# Patient Record
Sex: Female | Born: 1935 | Race: White | Hispanic: No | Smoking: Former smoker
Health system: Southern US, Community
[De-identification: ages and names within clinical notes are randomized; demographics above are authoritative.]

## PROBLEM LIST (undated history)

## (undated) DIAGNOSIS — F32A Depression, unspecified: Secondary | ICD-10-CM

## (undated) DIAGNOSIS — F0390 Unspecified dementia without behavioral disturbance: Secondary | ICD-10-CM

## (undated) DIAGNOSIS — J449 Chronic obstructive pulmonary disease, unspecified: Secondary | ICD-10-CM

## (undated) DIAGNOSIS — M1990 Unspecified osteoarthritis, unspecified site: Secondary | ICD-10-CM

## (undated) DIAGNOSIS — T7840XA Allergy, unspecified, initial encounter: Secondary | ICD-10-CM

## (undated) DIAGNOSIS — I219 Acute myocardial infarction, unspecified: Secondary | ICD-10-CM

## (undated) DIAGNOSIS — F329 Major depressive disorder, single episode, unspecified: Secondary | ICD-10-CM

## (undated) DIAGNOSIS — C3492 Malignant neoplasm of unspecified part of left bronchus or lung: Principal | ICD-10-CM

## (undated) DIAGNOSIS — F419 Anxiety disorder, unspecified: Secondary | ICD-10-CM

## (undated) DIAGNOSIS — I2510 Atherosclerotic heart disease of native coronary artery without angina pectoris: Secondary | ICD-10-CM

## (undated) DIAGNOSIS — H269 Unspecified cataract: Secondary | ICD-10-CM

## (undated) HISTORY — PX: TONSILLECTOMY: SUR1361

## (undated) HISTORY — DX: Atherosclerotic heart disease of native coronary artery without angina pectoris: I25.10

## (undated) HISTORY — DX: Acute myocardial infarction, unspecified: I21.9

## (undated) HISTORY — DX: Unspecified osteoarthritis, unspecified site: M19.90

## (undated) HISTORY — PX: OOPHORECTOMY: SHX86

## (undated) HISTORY — DX: Allergy, unspecified, initial encounter: T78.40XA

## (undated) HISTORY — DX: Major depressive disorder, single episode, unspecified: F32.9

## (undated) HISTORY — DX: Unspecified cataract: H26.9

## (undated) HISTORY — DX: Depression, unspecified: F32.A

## (undated) HISTORY — PX: ABDOMINAL HYSTERECTOMY: SHX81

## (undated) HISTORY — DX: Chronic obstructive pulmonary disease, unspecified: J44.9

## (undated) HISTORY — PX: CORONARY ARTERY BYPASS GRAFT: SHX141

## (undated) HISTORY — DX: Malignant neoplasm of unspecified part of left bronchus or lung: C34.92

## (undated) HISTORY — DX: Unspecified dementia, unspecified severity, without behavioral disturbance, psychotic disturbance, mood disturbance, and anxiety: F03.90

## (undated) HISTORY — DX: Anxiety disorder, unspecified: F41.9

---

## 2013-02-16 LAB — PULMONARY FUNCTION TEST

## 2013-07-14 LAB — PULMONARY FUNCTION TEST

## 2016-08-27 ENCOUNTER — Encounter: Payer: Self-pay | Admitting: Internal Medicine

## 2016-08-27 ENCOUNTER — Encounter: Payer: Self-pay | Admitting: *Deleted

## 2016-08-27 ENCOUNTER — Telehealth: Payer: Self-pay | Admitting: Internal Medicine

## 2016-08-27 NOTE — Progress Notes (Signed)
Oncology Nurse Navigator Documentation  Oncology Nurse Navigator Flowsheets 08/27/2016  Navigator Location [facility]  Referral date to RadOnc/MedOnc 08/27/2016  Navigator Encounter Type Other/In looking through Ms. [facility] information from oncology, I need more information.  I contacted [facility] in [facility] and requested more information.   Barriers/Navigation Needs Coordination of Care  Interventions Coordination of Care  Coordination of Care Other  Acuity Level 2  Acuity Level 2 Other  Time Spent with Patient 60

## 2016-08-27 NOTE — Telephone Encounter (Signed)
[name] cld to schedule an appt. Appt has been scheduled for the pt to see Dr. Julien Nordmann on 9/18 at 215pm. [name] will bring in records today. Aware to arrive 30 minutes early. Letter will be given with the appt date and time.

## 2016-08-29 ENCOUNTER — Telehealth: Payer: Self-pay | Admitting: Cardiology

## 2016-08-29 NOTE — Telephone Encounter (Signed)
Received records from [facility] as requested by Dr Stanford Breed.  Records given to Dr Stanford Breed for review. lp

## 2016-08-29 NOTE — Telephone Encounter (Signed)
Received records requested for new patient appointment with Dr Stanford Breed on 10/04/16.  Records put with Dr Jacalyn Lefevre schedule for 10/04/16. lp

## 2016-09-04 ENCOUNTER — Telehealth: Payer: Self-pay | Admitting: Cardiology

## 2016-09-04 NOTE — Telephone Encounter (Signed)
Received records from [place] for appointment on 10/04/16 with Dr Stanford Breed.  Records put with Dr Jacalyn Lefevre schedule for 10/04/16. lp

## 2016-09-21 NOTE — Progress Notes (Signed)
Referring-[facility] Reason for referral- Atrial fibrillation.  HPI: 81 year old female for evaluation of atrial fibrillation at request of [facility]. Previously followed in [place]. Patient also had coronary artery bypass graft in 2001. Nuclear study 2015 showed no evidence of infarct or ischemia and ejection fraction 68%. Echocardiogram March 2018 showed ejection fraction 55-60% and grade 1 diastolic dysfunction. Mild left atrial enlargement. Trace aortic and mitral regurgitation. Based on outside notes she has a history of GI bleed on xarelto. She also was on tikosyn previously. She does not recall the diagnosis of atrial fibrillation. She has dyspnea with some activities. No orthopnea, PND, pedal edema, chest pain or syncope. Uses home O2 at night.  Current Outpatient Prescriptions  Medication Sig Dispense Refill  . acetaminophen (TYLENOL) 650 MG CR tablet Take 650 mg by mouth every 8 (eight) hours as needed for pain.    . clonazePAM (KLONOPIN) 0.5 MG tablet Take 0.5 mg by mouth daily as needed for anxiety.     . Coenzyme Q-10 200 MG CAPS Take by mouth.    . dofetilide (TIKOSYN) 125 MCG capsule Take 125 mcg by mouth 2 (two) times daily.    . furosemide (LASIX) 40 MG tablet Take 40 mg by mouth.    . gabapentin (NEURONTIN) 100 MG capsule Take 100 mg by mouth 3 (three) times daily.    . hydroxychloroquine (PLAQUENIL) 200 MG tablet Take by mouth daily.    . hydrOXYzine (ATARAX/VISTARIL) 25 MG tablet Take 25 mg by mouth 3 (three) times daily as needed.    Marland Kitchen levothyroxine (SYNTHROID, LEVOTHROID) 88 MCG tablet Take 88 mcg by mouth daily before breakfast.    . loperamide (IMODIUM A-D) 2 MG tablet Take 2 mg by mouth 4 (four) times daily as needed for diarrhea or loose stools.    . metoprolol tartrate (LOPRESSOR) 25 MG tablet Take 25 mg by mouth 2 (two) times daily.    . mirtazapine (REMERON) 15 MG tablet Take 15 mg by mouth at bedtime.    . mupirocin ointment  (BACTROBAN) 2 % Place 1 application into the nose 2 (two) times daily.    . pantoprazole (PROTONIX) 40 MG tablet Take 40 mg by mouth daily.    . potassium chloride SA (K-DUR,KLOR-CON) 20 MEQ tablet Take 20 mEq by mouth 2 (two) times daily.    . predniSONE (DELTASONE) 5 MG tablet Take 5 mg by mouth daily with breakfast.    . rivastigmine (EXELON) 4.6 mg/24hr Place 4.6 mg onto the skin daily.    . sertraline (ZOLOFT) 100 MG tablet Take 100 mg by mouth daily.    Marland Kitchen tiotropium (SPIRIVA) 18 MCG inhalation capsule Place 18 mcg into inhaler and inhale daily.    . traMADol (ULTRAM) 50 MG tablet Take by mouth 2 (two) times daily.    Marland Kitchen aspirin EC 81 MG tablet Take 1 tablet (81 mg total) by mouth daily. 90 tablet 3  . atorvastatin (LIPITOR) 40 MG tablet Take 1 tablet (40 mg total) by mouth daily. 90 tablet 3   No current facility-administered medications for this visit.     Allergies  Allergen Reactions  . Sulfa Antibiotics   . Contrast Media [Iodinated Diagnostic Agents] Itching and Rash     Past Medical History:  Diagnosis Date  . Allergy   . Anxiety   . Arthritis   . CAD (coronary artery disease)   . Cataract   . COPD (chronic obstructive pulmonary disease) ([place])   . Depression   .  Myocardial infarction ([place])   . Stage IV squamous cell carcinoma of left lung ([place]) 09/25/2016    Past Surgical History:  Procedure Laterality Date  . ABDOMINAL HYSTERECTOMY    . CORONARY ARTERY BYPASS GRAFT    . OOPHORECTOMY    . TONSILLECTOMY      Social History   Social History  . Marital status: Divorced    Spouse name: N/A  . Number of children: 1  . Years of education: N/A   Occupational History  . Not on file.   Social History Main Topics  . Smoking status: Former Smoker    Packs/day: 1.00    Years: 40.00    Types: Cigarettes  . Smokeless tobacco: Never Used  . Alcohol use No  . Drug use: No  . Sexual activity: Not on file   Other Topics Concern  . Not on file   Social History  Narrative  . No narrative on file    Family History  Problem Relation Age of Onset  . Parkinsonism Father     ROS: no fevers or chills, productive cough, hemoptysis, dysphasia, odynophagia, melena, hematochezia, dysuria, hematuria, rash, seizure activity, orthopnea, PND, pedal edema, claudication. Remaining systems are negative.  Physical Exam:   Blood pressure (!) 118/52, pulse (!) 51, height 5' 3.5" (1.613 m), weight 49.9 kg (110 lb).  General:  Well developed/frail in NAD Skin warm/dry Patient not depressed No peripheral clubbing Back-normal HEENT-normal/normal eyelids Neck supple/normal carotid upstroke bilaterally; no bruits; no JVD; no thyromegaly chest - CTA/ normal expansion; previous sternotomy CV - RRR/normal S1 and S2; no murmurs, rubs or gallops;  PMI nondisplaced Abdomen -NT/ND, no HSM, no mass, + bowel sounds, no bruit 2+ femoral pulses, no bruits Ext-no edema, no chords, varicosities noted Neuro-grossly nonfocal  ECG - Sinus bradycardia, left bundle branch block. personally reviewed  A/P  1Coronary artery disease status post coronary artery bypass graft-patient is not having chest pain and last nuclear study by report shows no ischemia. Plan medical therapy. Add enteric-coated aspirin 81 mg daily. Add statin.   2 history of paroxysmal atrial fibrillation-patient is in sinus rhythm today. In reviewing her outside records she was on tikosyn in the past. She is not taking this medication at present. She apparently had a GI bleed on xarelto in the past and is not on anticoagulation. Also frail with mild dementia per son. I therefore elected not to resume at this point. They do understand the higher risk of CVA off of anticoagulation but I feel risk outweighs benefit.  Continue metoprolol for rate control if atrial fibrillation recurs.  3  hyperlipidemia-add Lipitor 40 mg daily. Check lipids and liver in 4 weeks.   Kirk Ruths, MD

## 2016-09-25 ENCOUNTER — Encounter: Payer: Self-pay | Admitting: Internal Medicine

## 2016-09-25 ENCOUNTER — Ambulatory Visit (HOSPITAL_BASED_OUTPATIENT_CLINIC_OR_DEPARTMENT_OTHER): Payer: Medicare Other | Admitting: Internal Medicine

## 2016-09-25 ENCOUNTER — Telehealth: Payer: Self-pay | Admitting: Internal Medicine

## 2016-09-25 DIAGNOSIS — Z87891 Personal history of nicotine dependence: Secondary | ICD-10-CM

## 2016-09-25 DIAGNOSIS — C778 Secondary and unspecified malignant neoplasm of lymph nodes of multiple regions: Secondary | ICD-10-CM

## 2016-09-25 DIAGNOSIS — J449 Chronic obstructive pulmonary disease, unspecified: Secondary | ICD-10-CM | POA: Diagnosis not present

## 2016-09-25 DIAGNOSIS — C3490 Malignant neoplasm of unspecified part of unspecified bronchus or lung: Secondary | ICD-10-CM

## 2016-09-25 DIAGNOSIS — C3492 Malignant neoplasm of unspecified part of left bronchus or lung: Secondary | ICD-10-CM | POA: Diagnosis not present

## 2016-09-25 DIAGNOSIS — M160 Bilateral primary osteoarthritis of hip: Secondary | ICD-10-CM

## 2016-09-25 DIAGNOSIS — F0390 Unspecified dementia without behavioral disturbance: Secondary | ICD-10-CM

## 2016-09-25 HISTORY — DX: Malignant neoplasm of unspecified part of left bronchus or lung: C34.92

## 2016-09-25 NOTE — Telephone Encounter (Signed)
Scheduled appt per 9/18 los - Gave patient AVS and calender per los. - [facility] radiology to contact patient with ct schedule.

## 2016-09-25 NOTE — Progress Notes (Signed)
[place] [phone]   [phone]  CONSULT NOTE  REFERRING PHYSICIAN: Dr. Simonne Martinet, [facility], [place]  REASON FOR CONSULTATION:  82 years old white female with history of lung cancer to establish care with medical oncology.  HPI Kristin Peterson is a 82 y.o. female was past medical history significant for multiple medical problems including history of COPD, anxiety/depression, coronary R disease status post CABG, dyslipidemia, COPD as well as history of smoking for around 35 years but quit more than 20 years ago. The patient also has a recent history of dementia. She came today to the clinic to establish care with me and accompanied by her son. The patient was initially diagnosed with stage IV non-small cell lung cancer, poorly differentiated squamous cell carcinoma in October 2015 when she was complaining of shortness of breath and routine chest x-ray showed multiple pulmonary nodules. Further investigation including a PET scan on 11/03/2013 showed multiple hypermetabolic pulmonary nodules in both the left upper lobe and right upper lobe. There was also a nodule noted in the left lower lobe as well as left hilar and AP window. There was no evidence of distant metastatic disease below the diaphragm. The patient underwent CT guided fine needle aspiration of the left lung nodule on 11/06/2013 and the final pathology from [facility] (case # [id number]) was positive for malignant cells, non-small cell carcinoma consistent with poorly differentiated squamous cell carcinoma. According to the patient she was started initially on 1 or 2 cycles of systemic chemotherapy but she could not tolerate it. She was then started on Nivolumab every 2 weeks and received a total of 11 treatment discontinued in May 2016 secondary to febrile episode as well as anemia secondary to gastritis. Imaging studies [place] the patient on Nivolumab showed improvement of her  condition. She had repeat CT scan of the chest in March 2017 that showed new pulmonary nodules in the lower lobes with some of which have 3 and body appearance and was too small for biopsy or PET.  Repeat CT scan of the chest on 07/21/2015 showed no evidence of disease recurrence or progression with a stable diffuse emphysematous changes and scattered areas of lung scarring. There was also extensive sigmoid colon and diverticular disease noted without diverticulitis. Because of her dementia the patient moved to [facility] to be close to her son. She currently lives in an assisted living facility, [facility]. Her last imaging studies were performed 6 months ago and she was supposed to have repeat scan on 09/18/2016. She came today for evaluation and management of her condition. When seen today the patient is feeling fine except for arthritis of her hip joints. She denied having any chest pain, shortness breath, cough or hemoptysis. She denied having any recent weight loss or night sweats. She has no nausea, vomiting, diarrhea or constipation. She definitely has dementia. Family history father died from parkinsonism, mother died from old age. The patient is divorced and has one son, Shanon Brow who accompanied her to the visit today. 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 She has a history of smoking 1 pack per day for around 35 years and quit 20 years ago. She has no history of alcohol or drug abuse. She is allergic to IV contrast.   HPI  Past Medical History:  Diagnosis Date  . Allergy   . Anxiety   . Arthritis   . Cataract   .  COPD (chronic obstructive pulmonary disease) ([place])   . Depression   . Myocardial infarction ([place])   . Stage IV squamous cell carcinoma of left lung ([place]) 09/25/2016    Past Surgical History:  Procedure Laterality Date  . ABDOMINAL HYSTERECTOMY    . CORONARY ARTERY BYPASS GRAFT    . OOPHORECTOMY      Family  History  Problem Relation Age of Onset  . Parkinsonism Father     Social History Social History  Substance Use Topics  . Smoking status: Former Smoker    Packs/day: 1.00    Years: 40.00    Types: Cigarettes  . Smokeless tobacco: Never Used  . Alcohol use Not on file    Not on File  No current outpatient prescriptions on file.   No current facility-administered medications for this visit.     Review of Systems  Constitutional: positive for fatigue Eyes: negative Ears, nose, mouth, throat, and face: negative Respiratory: negative Cardiovascular: negative Gastrointestinal: negative Genitourinary:negative Integument/breast: negative Hematologic/lymphatic: negative Musculoskeletal:positive for arthralgias and muscle weakness Neurological: positive for memory problems Behavioral/Psych: negative Endocrine: negative Allergic/Immunologic: negative  Physical Exam  BPZ:WCHEN, healthy, no distress, well nourished and well developed SKIN: skin color, texture, turgor are normal, no rashes or significant lesions HEAD: Normocephalic, No masses, lesions, tenderness or abnormalities EYES: normal, PERRLA, Conjunctiva are pink and non-injected EARS: External ears normal, Canals clear OROPHARYNX:no exudate, no erythema and lips, buccal mucosa, and tongue normal  NECK: supple, no adenopathy, no JVD LYMPH:  no palpable lymphadenopathy, no hepatosplenomegaly BREAST:Not examined. LUNGS: clear to auscultation , and palpation HEART: regular rate & rhythm, no murmurs and no gallops ABDOMEN:abdomen soft, non-tender, obese and no masses or organomegaly BACK: Back symmetric, no curvature., No CVA tenderness EXTREMITIES:no joint deformities, effusion, or inflammation, no edema, no skin discoloration  NEURO: alert & oriented x 3 with fluent speech, no focal motor/sensory deficits  PERFORMANCE STATUS: ECOG 2  LABORATORY DATA: No results found for: WBC, HGB, HCT, MCV, PLT    Chemistry     No results found for: NA, K, CL, CO2, BUN, CREATININE, GLU No results found for: CALCIUM, ALKPHOS, AST, ALT, BILITOT     RADIOGRAPHIC STUDIES: No results found.  ASSESSMENT: This is a very pleasant 82 years old white female with history of stage IV non-small cell lung cancer presented with bilateral pulmonary nodules in addition to left hilar and mediastinal lymphadenopathy diagnosed in October 2015 status post 11 cycles of treatment with Nivolumab discontinued in May 2016 secondary to intolerance but the patient had good response to this treatment at that time. She has been observation for the last few years. She has significant dementia.   PLAN: I had a lengthy discussion with the patient and her son today about her current condition and treatment options. It looks like the patient has been doing very well on observation for the last 2 years or so. Her last imaging studies were performed 6 months ago at [facility]. I strongly recommended for the son to bring copies of her previous CT scan for comparison. I will arrange for the patient to have repeat CT scan of the chest, abdomen and pelvis next week for evaluation of her disease. If the scan showed no concerning findings, I would see her back for follow-up visit in 6 months for reevaluation with repeat CT scan of the chest, abdomen and pelvis again. The patient will continue routine follow-up visits with her pulmonologist, cardiologist as well as primary care physician for her  other medical issues. She was advised to call immediately if she has any concerning symptoms in the interval. The patient voices understanding of current disease status and treatment options and is in agreement with the current care plan.  All questions were answered. The patient knows to call the clinic with any problems, questions or concerns. We can certainly see the patient much sooner if necessary.  Thank you so much for allowing me to participate  in the care of Kristin Peterson. I will continue to follow up the patient with you and assist in her care.  I spent 40 minutes counseling the patient face to face. The total time spent in the appointment was 60 minutes.  Disclaimer: This note was dictated with voice recognition software. Similar sounding words can inadvertently be transcribed and may not be corrected upon review.   Eilleen Kempf September 25, 2016, 2:34 PM

## 2016-09-27 ENCOUNTER — Ambulatory Visit (INDEPENDENT_AMBULATORY_CARE_PROVIDER_SITE_OTHER): Payer: Medicare Other | Admitting: Internal Medicine

## 2016-09-27 ENCOUNTER — Telehealth: Payer: Self-pay | Admitting: Internal Medicine

## 2016-09-27 ENCOUNTER — Encounter: Payer: Self-pay | Admitting: Internal Medicine

## 2016-09-27 VITALS — BP 108/60 | HR 50 | Ht 63.5 in | Wt 110.0 lb

## 2016-09-27 DIAGNOSIS — J449 Chronic obstructive pulmonary disease, unspecified: Secondary | ICD-10-CM | POA: Diagnosis not present

## 2016-09-27 NOTE — Progress Notes (Signed)
   Subjective:    Patient ID: Kristin Peterson, female    DOB: May 16, 1934, 82 y.o.   MRN: [id number]  HPI    Review of Systems  Constitutional: Negative for fever and unexpected weight change.  HENT: Negative for congestion, dental problem, ear pain, nosebleeds, postnasal drip, rhinorrhea, sinus pressure, sneezing, sore throat and trouble swallowing.   Eyes: Negative for redness and itching.  Respiratory: Positive for shortness of breath. Negative for cough, chest tightness and wheezing.   Cardiovascular: Negative for palpitations and leg swelling.  Gastrointestinal: Negative for nausea and vomiting.  Genitourinary: Negative for dysuria.  Musculoskeletal: Negative for joint swelling.  Skin: Negative for rash.  Allergic/Immunologic: Negative.  Negative for environmental allergies, food allergies and immunocompromised state.  Neurological: Negative for headaches.  Hematological: Bruises/bleeds easily.  Psychiatric/Behavioral: Negative for dysphoric mood. The patient is nervous/anxious.        Objective:   Physical Exam        Assessment & Plan:

## 2016-09-27 NOTE — Patient Instructions (Addendum)
ICD-10-CM   1. Chronic obstructive pulmonary disease, unspecified COPD type ([place]) J44.9    Stable disease  Plan - No pulmonary function test because it'll be difficult to interpret given your rheumatoid arthritis - Continue Spiriva daily - Continue Symbicort daily - Continue oxygen at night as before; We can retest for this need in the future if needed - - glad you had flu shot at [facility] - Please discuss with them if you need to have new shingles vaccine  -  sign release to get pulm records from [place], [place]   Follow-up  - 6 months or sooner if needed

## 2016-09-27 NOTE — Telephone Encounter (Signed)
Will send fax to receive pt's records from prev. Pulmonary location. Nothing further needed at this time.

## 2016-09-27 NOTE — Progress Notes (Signed)
Subjective:     Patient ID: Kristin Peterson, female   DOB: 1934-05-26, 82 y.o.   MRN: [id number]  HPI  IOV 09/27/2016  Chief Complaint  Patient presents with  . Advice Only    [name] stated that pt has moved and is trying to get established with a new pulmonary doctor. Pt has squamous cell carcinoma lt lung. Will have a CT scan next week and labwork with oncologist sometime next week. Denies any CP, cough. SOB occ.on exertion. 2L O2 at night.     Review of the chart and past medical history from Dr. Julien Nordmann 09/25/2016 shows that she has COPD, anxiety/depression, coronary artery disease, COPD not otherwise specified with a 30 pack smoking history quit 20 years ago. Was also recently diagnosed dementia. She has a diagnosis of stage IV non-small cell lung cancer diagnosed in October 2015. She was on immunotherapy Nivolumab status post 11 cycles ending May 2016 due to gastritis, anemia and febrile episodes. According to Dr. Julien Nordmann in July 2017 she had CT scan of the chest that showed no evidence of disease recurrence or progression but she had stable diffuse emphysematous changes. She currently because of her dementia moved to [facility] from [place]. Currently lives in assisted living facility because of her dementia. She is Dr. Julien Nordmann that she was feeling well and denied any respiratory complaints. She has a son Shanon Brow. ECOG listed as 2. According to Dr. Julien Nordmann the last scan was in early 2018 and his impression was to get repeat imaging and if this is stable for discharge for her with observation therapy. According to the son noted imaging will be done by oncology CT chest in the next week or so. The computer does not show any scheduled appointment  At this point in time patient states he feels stable in terms of his COPD. Medication review shows she is on Symbicort and Spiriva scheduled. According to the son the technicians administered this to her. She is on oxygen at night.  There are no issues . Other than occasional early morning wheezing she is feeling fine. She says she is on a had a flu shot for this season. Patient is mostly wheelchair bound because of severe hip arthralgia. But she is able to self care and transfer on the wheelchair     has a past medical history of Allergy; Anxiety; Arthritis; Cataract; COPD (chronic obstructive pulmonary disease) ([place]); Depression; Myocardial infarction [facility]); and Stage IV squamous cell carcinoma of left lung ([place]) (09/25/2016).   reports that she has quit smoking. Her smoking use included Cigarettes. She has a 40.00 pack-year smoking history. She has never used smokeless tobacco.  Past Surgical History:  Procedure Laterality Date  . ABDOMINAL HYSTERECTOMY    . CORONARY ARTERY BYPASS GRAFT    . OOPHORECTOMY      Allergies  Allergen Reactions  . Sulfa Antibiotics   . Contrast Media [Iodinated Diagnostic Agents] Itching and Rash     There is no immunization history on file for this patient.  Family History  Problem Relation Age of Onset  . Parkinsonism Father      Current Outpatient Prescriptions:  .  acetaminophen (TYLENOL) 650 MG CR tablet, Take 650 mg by mouth every 8 (eight) hours as needed for pain., Disp: , Rfl:  .  clonazePAM (KLONOPIN) 0.5 MG tablet, Take 0.5 mg by mouth daily as needed for anxiety. , Disp: , Rfl:  .  Coenzyme Q-10 200 MG CAPS, Take by mouth., Disp: ,  Rfl:  .  dofetilide (TIKOSYN) 125 MCG capsule, Take 125 mcg by mouth 2 (two) times daily., Disp: , Rfl:  .  furosemide (LASIX) 40 MG tablet, Take 40 mg by mouth., Disp: , Rfl:  .  gabapentin (NEURONTIN) 100 MG capsule, Take 100 mg by mouth 3 (three) times daily., Disp: , Rfl:  .  hydroxychloroquine (PLAQUENIL) 200 MG tablet, Take by mouth daily., Disp: , Rfl:  .  hydrOXYzine (ATARAX/VISTARIL) 25 MG tablet, Take 25 mg by mouth 3 (three) times daily as needed., Disp: , Rfl:  .  levothyroxine (SYNTHROID, LEVOTHROID) 88 MCG tablet, Take 88  mcg by mouth daily before breakfast., Disp: , Rfl:  .  loperamide (IMODIUM A-D) 2 MG tablet, Take 2 mg by mouth 4 (four) times daily as needed for diarrhea or loose stools., Disp: , Rfl:  .  metoprolol tartrate (LOPRESSOR) 25 MG tablet, Take 25 mg by mouth 2 (two) times daily., Disp: , Rfl:  .  mirtazapine (REMERON) 15 MG tablet, Take 15 mg by mouth at bedtime., Disp: , Rfl:  .  mupirocin ointment (BACTROBAN) 2 %, Place 1 application into the nose 2 (two) times daily., Disp: , Rfl:  .  pantoprazole (PROTONIX) 40 MG tablet, Take 40 mg by mouth daily., Disp: , Rfl:  .  potassium chloride SA (K-DUR,KLOR-CON) 20 MEQ tablet, Take 20 mEq by mouth 2 (two) times daily., Disp: , Rfl:  .  predniSONE (DELTASONE) 5 MG tablet, Take 5 mg by mouth daily with breakfast., Disp: , Rfl:  .  rivastigmine (EXELON) 4.6 mg/24hr, Place 4.6 mg onto the skin daily., Disp: , Rfl:  .  sertraline (ZOLOFT) 100 MG tablet, Take 100 mg by mouth daily., Disp: , Rfl:  .  tiotropium (SPIRIVA) 18 MCG inhalation capsule, Place 18 mcg into inhaler and inhale daily., Disp: , Rfl:  .  traMADol (ULTRAM) 50 MG tablet, Take by mouth 2 (two) times daily., Disp: , Rfl:     Review of Systems     Objective:   Physical Exam Vitals:   09/27/16 1015  BP: 108/60  Pulse: (!) 50  SpO2: 98%  Weight: 110 lb (49.9 kg)  Height: 5' 3.5" (1.613 m)    Estimated body mass index is 19.18 kg/m as calculated from the following:   Height as of this encounter: 5' 3.5" (1.613 m).   Weight as of this encounter: 110 lb (49.9 kg).  Elderly frail female sitting in the wheelchair Pleasant and well-dressed. Appears to be alert and oriented 3 and able to give reasonable history Obvious deformities of rheumatoid arthritis present Mild barrel chest but clear to auscultation bilaterally. Some scattered basal crackles no wheezes Normal heart sounds Abdomen is soft  No cyanosis no clubbing no edema    Assessment:       ICD-10-CM   1. Chronic  obstructive pulmonary disease, unspecified COPD type ([place]) J44.9        Plan:      Stable disease  Plan - No pulmonary function test because it'll be difficult to interpret given your rheumatoid arthritis - Continue Spiriva daily - Continue Symbicort daily - Continue oxygen at night as before; We can retest for this need in the future if needed - - glad you had flu shot at [facility] - Please discuss with them if you need to have new shingles vaccine    Follow-up  - 6 months or sooner if needed    Dr. Brand Males, M.D., [facility].C.P Pulmonary and Critical Care Medicine Staff  Physician [place] [facility] Pager: [phone], If no answer or between  15:00h - 7:00h: call [phone]  09/27/2016 10:29 AM

## 2016-10-03 NOTE — Telephone Encounter (Signed)
Called and spoke with Amy from Medical Records at [facility] in [facility] who stated that she did fax pt's records over this morning. Did receive records and will place in MR's look at. Nothing further needed.

## 2016-10-04 ENCOUNTER — Encounter: Payer: Self-pay | Admitting: Cardiology

## 2016-10-04 ENCOUNTER — Ambulatory Visit (INDEPENDENT_AMBULATORY_CARE_PROVIDER_SITE_OTHER): Payer: Medicare Other | Admitting: Cardiology

## 2016-10-04 VITALS — BP 118/52 | HR 51 | Ht 63.5 in | Wt 110.0 lb

## 2016-10-04 DIAGNOSIS — I480 Paroxysmal atrial fibrillation: Secondary | ICD-10-CM | POA: Diagnosis not present

## 2016-10-04 DIAGNOSIS — E7800 Pure hypercholesterolemia, unspecified: Secondary | ICD-10-CM | POA: Diagnosis not present

## 2016-10-04 DIAGNOSIS — I25810 Atherosclerosis of coronary artery bypass graft(s) without angina pectoris: Secondary | ICD-10-CM

## 2016-10-04 MED ORDER — ASPIRIN EC 81 MG PO TBEC: 81.0000 mg | DELAYED_RELEASE_TABLET | Freq: Every day | ORAL | 3 refills | Status: DC

## 2016-10-04 MED ORDER — ATORVASTATIN CALCIUM 40 MG PO TABS: 40.0000 mg | ORAL_TABLET | Freq: Every day | ORAL | 3 refills | Status: AC

## 2016-10-04 NOTE — Patient Instructions (Signed)
Medication Instructions:   START ASPIRIN 81 MG ONCE DAILY  START ATORVASTATIN 40 MG ONCE DAILY  Labwork:  Your physician recommends that you return for lab work in: [place] TO LAB WORK  Follow-Up:  Your physician wants you to follow-up in: [place] will receive a reminder letter in the mail two months in advance. If you don't receive a letter, please call our office to schedule the follow-up appointment.   If you need a refill on your cardiac medications before your next appointment, please call your pharmacy.

## 2016-10-18 ENCOUNTER — Ambulatory Visit (HOSPITAL_COMMUNITY)
Admission: RE | Admit: 2016-10-18 | Discharge: 2016-10-18 | Disposition: A | Discharge status: Home / Self Care | Payer: Medicare Other | Source: Ambulatory Visit | Attending: Internal Medicine | Admitting: Internal Medicine

## 2016-10-18 DIAGNOSIS — I700 Atherosclerosis of aorta: Secondary | ICD-10-CM | POA: Insufficient documentation

## 2016-10-18 DIAGNOSIS — I2510 Atherosclerotic heart disease of native coronary artery without angina pectoris: Secondary | ICD-10-CM | POA: Insufficient documentation

## 2016-10-18 DIAGNOSIS — C3492 Malignant neoplasm of unspecified part of left bronchus or lung: Secondary | ICD-10-CM

## 2016-10-18 DIAGNOSIS — K5730 Diverticulosis of large intestine without perforation or abscess without bleeding: Secondary | ICD-10-CM | POA: Insufficient documentation

## 2016-10-18 DIAGNOSIS — M1612 Unilateral primary osteoarthritis, left hip: Secondary | ICD-10-CM | POA: Diagnosis not present

## 2016-10-18 DIAGNOSIS — C3412 Malignant neoplasm of upper lobe, left bronchus or lung: Secondary | ICD-10-CM | POA: Diagnosis not present

## 2016-10-18 DIAGNOSIS — C3490 Malignant neoplasm of unspecified part of unspecified bronchus or lung: Secondary | ICD-10-CM

## 2016-10-18 DIAGNOSIS — J439 Emphysema, unspecified: Secondary | ICD-10-CM | POA: Insufficient documentation

## 2016-10-25 ENCOUNTER — Encounter: Payer: Self-pay | Admitting: Internal Medicine

## 2016-10-31 LAB — HEPATIC FUNCTION PANEL
ALT: 24 [IU]/L (ref 0–32)
AST: 27 [IU]/L (ref 0–40)
Albumin: 4.4 g/dL (ref 3.5–4.8)
Alkaline Phosphatase: 55 [IU]/L (ref 39–117)
Bilirubin Total: 0.4 mg/dL (ref 0.0–1.2)
Bilirubin, Direct: 0.13 mg/dL (ref 0.00–0.40)
Total Protein: 6.7 g/dL (ref 6.0–8.5)

## 2016-10-31 LAB — LIPID PANEL
Chol/HDL Ratio: 2.3 ratio (ref 0.0–4.4)
Cholesterol, Total: 173 mg/dL (ref 100–199)
HDL: 74 mg/dL
LDL Calculated: 76 mg/dL (ref 0–99)
Triglycerides: 114 mg/dL (ref 0–149)
VLDL Cholesterol Cal: 23 mg/dL (ref 5–40)

## 2016-11-01 ENCOUNTER — Encounter: Payer: Self-pay | Admitting: *Deleted

## 2017-03-22 ENCOUNTER — Ambulatory Visit (INDEPENDENT_AMBULATORY_CARE_PROVIDER_SITE_OTHER): Payer: Medicare Other | Admitting: Internal Medicine

## 2017-03-22 ENCOUNTER — Encounter: Payer: Self-pay | Admitting: Internal Medicine

## 2017-03-22 VITALS — BP 114/60 | HR 76 | Ht 63.5 in | Wt 110.8 lb

## 2017-03-22 DIAGNOSIS — J449 Chronic obstructive pulmonary disease, unspecified: Secondary | ICD-10-CM | POA: Diagnosis not present

## 2017-03-22 NOTE — Addendum Note (Signed)
Addended by: Lorretta Harp on: 03/22/2017 11:35 AM   Modules accepted: Orders

## 2017-03-22 NOTE — Patient Instructions (Addendum)
ICD-10-CM   1. Stage 2 moderate COPD by GOLD classification ([place]) J44.9    You have stage 2 moderate bordering on stage 3 severe copd based on 2015 pft Currently in stable health  [place] and symbicort schduled as before [facility] for daily prednisone Retest ONO on room air - so we can qualify you for company in [place] followup with Dr Julien Nordmann as before  Followup 6 months or sooner if needed

## 2017-03-22 NOTE — Progress Notes (Signed)
Subjective:     Patient ID: Kristin Peterson, female   DOB: [birth year], 83 y.o.   MRN: [id number]  HPI  IOV 09/27/2016  Chief Complaint  Patient presents with  . Advice Only    [name] stated that pt has moved and is trying to get established with a new pulmonary doctor. Pt has squamous cell carcinoma lt lung. Will have a CT scan next week and labwork with oncologist sometime next week. Denies any CP, cough. SOB occ.on exertion. 2L O2 at night.     Review of the chart and past medical history from Dr. Julien Nordmann 09/25/2016 shows that she has COPD, anxiety/depression, coronary artery disease, COPD not otherwise specified with a 30 pack smoking history quit 20 years ago. Was also recently diagnosed dementia. She has a diagnosis of stage IV non-small cell lung cancer diagnosed in October 2015. She was on immunotherapy Nivolumab status post 11 cycles ending May 2016 due to gastritis, anemia and febrile episodes. According to Dr. Julien Nordmann in July 2017 she had CT scan of the chest that showed no evidence of disease recurrence or progression but she had stable diffuse emphysematous changes. She currently because of her dementia moved to [facility] from [place]. Currently lives in assisted living facility because of her dementia. She is Dr. Julien Nordmann that she was feeling well and denied any respiratory complaints. She has a son Shanon Brow. ECOG listed as 2. According to Dr. Julien Nordmann the last scan was in early 2018 and his impression was to get repeat imaging and if this is stable for discharge for her with observation therapy. According to the son noted imaging will be done by oncology CT chest in the next week or so. The computer does not show any scheduled appointment  At this point in time patient states he feels stable in terms of his COPD. Medication review shows she is on Symbicort and Spiriva scheduled. According to the son the technicians administered this to her. She is on oxygen at night.  There are no issues . Other than occasional early morning wheezing she is feeling fine. She says she is on a had a flu shot for this season. Patient is mostly wheelchair bound because of severe hip arthralgia. But she is able to self care and transfer on the wheelchair   OV 03/22/2017  Chief Complaint  Patient presents with  . Follow-up    Pt states she has been doing good since last visit. Pt does use O2 at night. Pt does have increased SOB with exertion, occ. cough. Denies any CP.   Follow-up for this 83 year old female with COPD.  She has recently relocated from [place].  She is wheelchair-bound because of rheumatoid arthritis of the hip and also has lung cancer on follow-up with Dr. Julien Nordmann here.  She is here with her son.  She did have a CT scan of the chest in October 2018 that showed emphysema but did not show any evidence of lung cancer recurrence.  Son tells me that they are follow-up pending with Dr. Julien Nordmann next week with a CT scan.  At this point in time COPD stable and symptoms severe it is listed below on the CAT score.  They did bring her old records from [place].  It shows that she has Gold stage II/III COPD with an FEV1 of 1.07 L / 54% which according to the doctor this is unchanged from 2013.  Classic obstructive flow volume loop.  She also uses 2 L  oxygen at night.  I did review a positive desaturation test in 2015.  The oxygen is currently being supplied by this company in [place].  The son wants to make a move to using advanced home care in [place].  Patient himself denies any active complaints she says she is compliant with Spiriva and Symbicort and is feeling stable at this point.  Despite the stability of COPD CAT score is 21.    CAT COPD Symptom & Quality of Life Score (GSK trademark) 0 is no burden. 5 is highest burden 03/22/2017   Never Cough -> Cough all the time 3  No phlegm in chest -> Chest is full of phlegm 1  No chest tightness -> Chest  feels very tight 0  No dyspnea for 1 flight stairs/hill -> Very dyspneic for 1 flight of stairs 5  No limitations for ADL at home -> Very limited with ADL at home 5  Confident leaving home -> Not at all confident leaving home 3  Sleep soundly -> Do not sleep soundly because of lung condition 1  Lots of Energy -> No energy at all 3  TOTAL Score (max 40)  21       has a past medical history of Allergy, Anxiety, Arthritis, CAD (coronary artery disease), Cataract, COPD (chronic obstructive pulmonary disease) ([place]), Dementia, Depression, Myocardial infarction ([place]), and Stage IV squamous cell carcinoma of left lung ([place]) (09/25/2016).   reports that she quit smoking about 21 years ago. Her smoking use included cigarettes. She has a 40.00 pack-year smoking history. she has never used smokeless tobacco.  Past Surgical History:  Procedure Laterality Date  . ABDOMINAL HYSTERECTOMY    . CORONARY ARTERY BYPASS GRAFT    . OOPHORECTOMY    . TONSILLECTOMY      Allergies  Allergen Reactions  . Sulfa Antibiotics   . Contrast Media [Iodinated Diagnostic Agents] Itching and Rash    Immunization History  Administered Date(s) Administered  . Influenza, High Dose Seasonal PF 11/22/2016    Family History  Problem Relation Age of Onset  . Parkinsonism Father      Current Outpatient Medications:  .  acetaminophen (TYLENOL) 650 MG CR tablet, Take 650 mg by mouth every 8 (eight) hours as needed for pain., Disp: , Rfl:  .  aspirin EC 81 MG tablet, Take 1 tablet (81 mg total) by mouth daily., Disp: 90 tablet, Rfl: 3 .  atorvastatin (LIPITOR) 40 MG tablet, Take 1 tablet (40 mg total) by mouth daily., Disp: 90 tablet, Rfl: 3 .  budesonide-formoterol (SYMBICORT) 160-4.5 MCG/ACT inhaler, Inhale 2 puffs into the lungs 2 (two) times daily., Disp: , Rfl:  .  clonazePAM (KLONOPIN) 0.5 MG tablet, Take 0.5 mg by mouth daily as needed for anxiety. , Disp: , Rfl:  .  Coenzyme Q-10 200 MG CAPS, Take by mouth.,  Disp: , Rfl:  .  dofetilide (TIKOSYN) 125 MCG capsule, Take 125 mcg by mouth 2 (two) times daily., Disp: , Rfl:  .  furosemide (LASIX) 40 MG tablet, Take 40 mg by mouth., Disp: , Rfl:  .  gabapentin (NEURONTIN) 100 MG capsule, Take 100 mg by mouth 3 (three) times daily., Disp: , Rfl:  .  hydroxychloroquine (PLAQUENIL) 200 MG tablet, Take by mouth daily., Disp: , Rfl:  .  hydrOXYzine (ATARAX/VISTARIL) 25 MG tablet, Take 25 mg by mouth 3 (three) times daily as needed., Disp: , Rfl:  .  levothyroxine (SYNTHROID, LEVOTHROID) 88 MCG tablet, Take 88 mcg by mouth  daily before breakfast., Disp: , Rfl:  .  loperamide (IMODIUM A-D) 2 MG tablet, Take 2 mg by mouth 4 (four) times daily as needed for diarrhea or loose stools., Disp: , Rfl:  .  loratadine (CLARITIN) 10 MG tablet, Take 10 mg by mouth daily., Disp: , Rfl:  .  memantine (NAMENDA) 5 MG tablet, Take 5 mg by mouth 2 (two) times daily., Disp: , Rfl:  .  metoprolol tartrate (LOPRESSOR) 25 MG tablet, Take 25 mg by mouth 2 (two) times daily., Disp: , Rfl:  .  mirtazapine (REMERON) 15 MG tablet, Take 15 mg by mouth at bedtime., Disp: , Rfl:  .  mupirocin ointment (BACTROBAN) 2 %, Place 1 application into the nose 2 (two) times daily., Disp: , Rfl:  .  pantoprazole (PROTONIX) 40 MG tablet, Take 40 mg by mouth daily., Disp: , Rfl:  .  potassium chloride SA (K-DUR,KLOR-CON) 20 MEQ tablet, Take 20 mEq by mouth 2 (two) times daily., Disp: , Rfl:  .  predniSONE (DELTASONE) 5 MG tablet, Take 5 mg by mouth daily with breakfast., Disp: , Rfl:  .  rivastigmine (EXELON) 4.6 mg/24hr, Place 4.6 mg onto the skin daily., Disp: , Rfl:  .  sertraline (ZOLOFT) 100 MG tablet, Take 100 mg by mouth daily., Disp: , Rfl:  .  sodium chloride 1 g tablet, Take 1 g by mouth 2 (two) times daily with a meal., Disp: , Rfl:  .  tiotropium (SPIRIVA) 18 MCG inhalation capsule, Place 18 mcg into inhaler and inhale daily., Disp: , Rfl:  .  traMADol (ULTRAM) 50 MG tablet, Take by mouth 2  (two) times daily., Disp: , Rfl:     Review of Systems     Objective:   Physical Exam Vitals:   03/22/17 1045  BP: 114/60  Pulse: 76  SpO2: 97%  Weight: 110 lb 12.8 oz (50.3 kg)  Height: 5' 3.5" (1.613 m)    Estimated body mass index is 19.32 kg/m as calculated from the following:   Height as of this encounter: 5' 3.5" (1.613 m).   Weight as of this encounter: 110 lb 12.8 oz (50.3 kg).    General Appearance:    Looks well. Frail female. Sitting in wheel chari  Head:    Normocephalic, without obvious abnormality, atraumatic  Eyes:    PERRL - yes, conjunctiva/corneas - clear      Ears:    Normal external ear canals, both ears  Nose:   NG tube - no . No o2 either  Throat:  ETT TUBE - no , OG tube - no  Neck:   Supple,  No enlargement/tenderness/nodules     Lungs:     Clear to auscultation bilaterally, very faint occ scattered wheeze  Chest wall:    No deformity but  Mildly kpyhotic  Heart:    S1 and S2 normal, no murmur, CVP - no.  Pressors - no  Abdomen:     Soft, no masses, no organomegaly  Genitalia:    Not done  Rectal:   not done  Extremities:   RA and sitting in wheel chair     Skin:   Intact in exposed areas .     Neurologic:   Sedation - none -> RASS - na . Moves all 4s - yes. CAM-ICU - neg . Orientation - x3+         Assessment:       ICD-10-CM   1. Stage 2 moderate COPD by GOLD classification ([place])  J44.9        Plan:      You have stage 2 moderate bordering on stage 3 severe copd based on 2015 pft Currently in stable health  [place] and symbicort schduled as before [facility] for daily prednisone Retest ONO on room air - so we can qualify you for company in [employer] followup with Dr Julien Nordmann as before  Followup 6 months or sooner if needed   Dr. Brand Males, M.D., [facility].C.P Pulmonary and Critical Care Medicine Staff Physician, [place] Director - Interstitial Lung Disease  Program  Pulmonary  [place] at [place], [place], [address]  Pager: [phone], If no answer or between  15:00h - 7:00h: call [phone] Telephone: [phone]

## 2017-03-25 ENCOUNTER — Inpatient Hospital Stay: Payer: Medicare Other | Attending: Internal Medicine

## 2017-03-25 ENCOUNTER — Encounter (HOSPITAL_COMMUNITY): Payer: Self-pay

## 2017-03-25 ENCOUNTER — Ambulatory Visit (HOSPITAL_COMMUNITY)
Admission: RE | Admit: 2017-03-25 | Discharge: 2017-03-25 | Disposition: A | Discharge status: Home / Self Care | Payer: Medicare Other | Source: Ambulatory Visit | Attending: Internal Medicine | Admitting: Internal Medicine

## 2017-03-25 DIAGNOSIS — K5730 Diverticulosis of large intestine without perforation or abscess without bleeding: Secondary | ICD-10-CM | POA: Insufficient documentation

## 2017-03-25 DIAGNOSIS — M160 Bilateral primary osteoarthritis of hip: Secondary | ICD-10-CM | POA: Insufficient documentation

## 2017-03-25 DIAGNOSIS — I2510 Atherosclerotic heart disease of native coronary artery without angina pectoris: Secondary | ICD-10-CM | POA: Insufficient documentation

## 2017-03-25 DIAGNOSIS — Z9221 Personal history of antineoplastic chemotherapy: Secondary | ICD-10-CM | POA: Diagnosis not present

## 2017-03-25 DIAGNOSIS — C3490 Malignant neoplasm of unspecified part of unspecified bronchus or lung: Secondary | ICD-10-CM | POA: Insufficient documentation

## 2017-03-25 DIAGNOSIS — F0390 Unspecified dementia without behavioral disturbance: Secondary | ICD-10-CM | POA: Diagnosis not present

## 2017-03-25 DIAGNOSIS — I700 Atherosclerosis of aorta: Secondary | ICD-10-CM | POA: Insufficient documentation

## 2017-03-25 DIAGNOSIS — I252 Old myocardial infarction: Secondary | ICD-10-CM | POA: Insufficient documentation

## 2017-03-25 DIAGNOSIS — Z7982 Long term (current) use of aspirin: Secondary | ICD-10-CM | POA: Insufficient documentation

## 2017-03-25 DIAGNOSIS — C3492 Malignant neoplasm of unspecified part of left bronchus or lung: Secondary | ICD-10-CM | POA: Insufficient documentation

## 2017-03-25 DIAGNOSIS — Z85118 Personal history of other malignant neoplasm of bronchus and lung: Secondary | ICD-10-CM | POA: Diagnosis present

## 2017-03-25 DIAGNOSIS — M129 Arthropathy, unspecified: Secondary | ICD-10-CM | POA: Insufficient documentation

## 2017-03-25 DIAGNOSIS — J449 Chronic obstructive pulmonary disease, unspecified: Secondary | ICD-10-CM | POA: Insufficient documentation

## 2017-03-25 DIAGNOSIS — J439 Emphysema, unspecified: Secondary | ICD-10-CM | POA: Insufficient documentation

## 2017-03-25 DIAGNOSIS — Z79899 Other long term (current) drug therapy: Secondary | ICD-10-CM | POA: Insufficient documentation

## 2017-03-25 DIAGNOSIS — F418 Other specified anxiety disorders: Secondary | ICD-10-CM | POA: Insufficient documentation

## 2017-03-25 LAB — CBC WITH DIFFERENTIAL/PLATELET
BASOS ABS: 0 10*3/uL (ref 0.0–0.1)
BASOS PCT: 0 %
EOS ABS: 0.3 10*3/uL (ref 0.0–0.5)
Eosinophils Relative: 2 %
HEMATOCRIT: 34.5 % — AB (ref 34.8–46.6)
Hemoglobin: 11.1 g/dL — ABNORMAL LOW (ref 11.6–15.9)
Lymphocytes Relative: 5 %
Lymphs Abs: 0.6 10*3/uL — ABNORMAL LOW (ref 0.9–3.3)
MCH: 30.8 pg (ref 25.1–34.0)
MCHC: 32 g/dL (ref 31.5–36.0)
MCV: 96.1 fL (ref 79.5–101.0)
MONO ABS: 0.7 10*3/uL (ref 0.1–0.9)
Monocytes Relative: 5 %
NEUTROS ABS: 11.7 10*3/uL — AB (ref 1.5–6.5)
NEUTROS PCT: 88 %
Platelets: 307 10*3/uL (ref 145–400)
RBC: 3.6 MIL/uL — ABNORMAL LOW (ref 3.70–5.45)
RDW: 14.1 % (ref 11.2–14.5)
WBC: 13.3 10*3/uL — ABNORMAL HIGH (ref 3.9–10.3)

## 2017-03-25 LAB — COMPREHENSIVE METABOLIC PANEL
ALK PHOS: 63 U/L (ref 40–150)
ALT: 20 U/L (ref 0–55)
ANION GAP: 9 (ref 3–11)
AST: 20 U/L (ref 5–34)
Albumin: 3.6 g/dL (ref 3.5–5.0)
BILIRUBIN TOTAL: 0.3 mg/dL (ref 0.2–1.2)
BUN: 22 mg/dL (ref 7–26)
CALCIUM: 9.5 mg/dL (ref 8.4–10.4)
CO2: 29 mmol/L (ref 22–29)
CREATININE: 1.39 mg/dL — AB (ref 0.60–1.10)
Chloride: 105 mmol/L (ref 98–109)
GFR calc non Af Amer: 35 mL/min — ABNORMAL LOW (ref >60)
GFR, EST AFRICAN AMERICAN: 40 mL/min — AB (ref >60)
GLUCOSE: 76 mg/dL (ref 70–140)
Potassium: 4.4 mmol/L (ref 3.5–5.1)
Sodium: 143 mmol/L (ref 136–145)
TOTAL PROTEIN: 7 g/dL (ref 6.4–8.3)

## 2017-03-27 ENCOUNTER — Other Ambulatory Visit: Payer: Self-pay | Admitting: Internal Medicine

## 2017-03-27 ENCOUNTER — Telehealth: Payer: Self-pay | Admitting: Internal Medicine

## 2017-03-27 ENCOUNTER — Encounter: Payer: Self-pay | Admitting: Internal Medicine

## 2017-03-27 ENCOUNTER — Inpatient Hospital Stay (HOSPITAL_BASED_OUTPATIENT_CLINIC_OR_DEPARTMENT_OTHER): Payer: Medicare Other | Admitting: Internal Medicine

## 2017-03-27 DIAGNOSIS — Z85118 Personal history of other malignant neoplasm of bronchus and lung: Secondary | ICD-10-CM

## 2017-03-27 DIAGNOSIS — C3490 Malignant neoplasm of unspecified part of unspecified bronchus or lung: Secondary | ICD-10-CM

## 2017-03-27 DIAGNOSIS — Z9221 Personal history of antineoplastic chemotherapy: Secondary | ICD-10-CM

## 2017-03-27 DIAGNOSIS — Z79899 Other long term (current) drug therapy: Secondary | ICD-10-CM

## 2017-03-27 DIAGNOSIS — I252 Old myocardial infarction: Secondary | ICD-10-CM | POA: Diagnosis not present

## 2017-03-27 DIAGNOSIS — Z7982 Long term (current) use of aspirin: Secondary | ICD-10-CM

## 2017-03-27 DIAGNOSIS — I2510 Atherosclerotic heart disease of native coronary artery without angina pectoris: Secondary | ICD-10-CM | POA: Diagnosis not present

## 2017-03-27 DIAGNOSIS — M129 Arthropathy, unspecified: Secondary | ICD-10-CM

## 2017-03-27 DIAGNOSIS — I700 Atherosclerosis of aorta: Secondary | ICD-10-CM | POA: Diagnosis not present

## 2017-03-27 DIAGNOSIS — F418 Other specified anxiety disorders: Secondary | ICD-10-CM

## 2017-03-27 DIAGNOSIS — J449 Chronic obstructive pulmonary disease, unspecified: Secondary | ICD-10-CM

## 2017-03-27 DIAGNOSIS — K5730 Diverticulosis of large intestine without perforation or abscess without bleeding: Secondary | ICD-10-CM | POA: Diagnosis not present

## 2017-03-27 DIAGNOSIS — F0390 Unspecified dementia without behavioral disturbance: Secondary | ICD-10-CM

## 2017-03-27 NOTE — Progress Notes (Signed)
[place] [phone]   [phone]  OFFICE PROGRESS NOTE  [place] [address] [place] [address]  DIAGNOSIS: Stage IV non-small cell lung cancer presented with bilateral pulmonary nodules in addition to left hilar and mediastinal lymphadenopathy diagnosed in October 2015  PRIOR THERAPY: Status post 11 cycles of treatment with Nivolumab discontinued in May 2016 secondary to intolerance but the patient had good response to this treatment at that time.  CURRENT THERAPY: Observation for the last few years. She has significant dementia.  INTERVAL HISTORY: Kristin Peterson 83 y.o. female returns to the clinic today for follow-up visit accompanied by her son.  The patient is feeling fine today with no specific complaints.  She denied having any chest pain but has shortness breath with exertion with mild cough and no hemoptysis.  She denied having any recent weight loss or night sweats.  She has no fever or chills.  She has no significant nausea, vomiting, diarrhea or constipation.  She had repeat CT scan of the chest, abdomen and pelvis performed recently and she is here for evaluation and discussion of her scan results.  MEDICAL HISTORY: Past Medical History:  Diagnosis Date  . Allergy   . Anxiety   . Arthritis   . CAD (coronary artery disease)   . Cataract   . COPD (chronic obstructive pulmonary disease) ([place])   . Dementia   . Depression   . Myocardial infarction ([place])   . Stage IV squamous cell carcinoma of left lung ([place]) 09/25/2016    ALLERGIES:  is allergic to sulfa antibiotics and contrast media [iodinated diagnostic agents].  MEDICATIONS:  Current Outpatient Medications  Medication Sig Dispense Refill  . acetaminophen (TYLENOL) 650 MG CR tablet Take 650 mg by mouth every 8 (eight) hours as needed for pain.    Marland Kitchen aspirin EC 81 MG tablet Take 1 tablet (81 mg total) by mouth daily. 90 tablet 3  .  atorvastatin (LIPITOR) 40 MG tablet Take 1 tablet (40 mg total) by mouth daily. 90 tablet 3  . budesonide-formoterol (SYMBICORT) 160-4.5 MCG/ACT inhaler Inhale 2 puffs into the lungs 2 (two) times daily.    . clonazePAM (KLONOPIN) 0.5 MG tablet Take 0.5 mg by mouth daily as needed for anxiety.     . Coenzyme Q-10 200 MG CAPS Take by mouth.    . dofetilide (TIKOSYN) 125 MCG capsule Take 125 mcg by mouth 2 (two) times daily.    . furosemide (LASIX) 40 MG tablet Take 40 mg by mouth.    . gabapentin (NEURONTIN) 100 MG capsule Take 100 mg by mouth 3 (three) times daily.    . hydroxychloroquine (PLAQUENIL) 200 MG tablet Take by mouth daily.    . hydrOXYzine (ATARAX/VISTARIL) 25 MG tablet Take 25 mg by mouth 3 (three) times daily as needed.    Marland Kitchen levothyroxine (SYNTHROID, LEVOTHROID) 88 MCG tablet Take 88 mcg by mouth daily before breakfast.    . loperamide (IMODIUM A-D) 2 MG tablet Take 2 mg by mouth 4 (four) times daily as needed for diarrhea or loose stools.    Marland Kitchen loratadine (CLARITIN) 10 MG tablet Take 10 mg by mouth daily.    . memantine (NAMENDA) 5 MG tablet Take 5 mg by mouth 2 (two) times daily.    . metoprolol tartrate (LOPRESSOR) 25 MG tablet Take 25 mg by mouth 2 (two) times daily.    . mirtazapine (REMERON) 15 MG tablet Take 15 mg  by mouth at bedtime.    . mupirocin ointment (BACTROBAN) 2 % Place 1 application into the nose 2 (two) times daily.    . pantoprazole (PROTONIX) 40 MG tablet Take 40 mg by mouth daily.    . potassium chloride SA (K-DUR,KLOR-CON) 20 MEQ tablet Take 20 mEq by mouth 2 (two) times daily.    . predniSONE (DELTASONE) 5 MG tablet Take 5 mg by mouth daily with breakfast.    . rivastigmine (EXELON) 4.6 mg/24hr Place 4.6 mg onto the skin daily.    . sertraline (ZOLOFT) 100 MG tablet Take 100 mg by mouth daily.    . sodium chloride 1 g tablet Take 1 g by mouth 2 (two) times daily with a meal.    . tiotropium (SPIRIVA) 18 MCG inhalation capsule Place 18 mcg into inhaler and  inhale daily.    . traMADol (ULTRAM) 50 MG tablet Take by mouth 2 (two) times daily.     No current facility-administered medications for this visit.     SURGICAL HISTORY:  Past Surgical History:  Procedure Laterality Date  . ABDOMINAL HYSTERECTOMY    . CORONARY ARTERY BYPASS GRAFT    . OOPHORECTOMY    . TONSILLECTOMY      REVIEW OF SYSTEMS:  A comprehensive review of systems was negative except for: Respiratory: positive for cough and dyspnea on exertion   PHYSICAL EXAMINATION: General appearance: alert, cooperative and no distress Head: Normocephalic, without obvious abnormality, atraumatic Neck: no adenopathy, no JVD, supple, symmetrical, trachea midline and thyroid not enlarged, symmetric, no tenderness/mass/nodules Lymph nodes: Cervical, supraclavicular, and axillary nodes normal. Resp: clear to auscultation bilaterally Back: symmetric, no curvature. ROM normal. No CVA tenderness. Cardio: regular rate and rhythm, S1, S2 normal, no murmur, click, rub or gallop GI: soft, non-tender; bowel sounds normal; no masses,  no organomegaly Extremities: extremities normal, atraumatic, no cyanosis or edema  ECOG PERFORMANCE STATUS: 1 - Symptomatic but completely ambulatory  Blood pressure 92/71, pulse (!) 55, temperature 98.1 F (36.7 C), temperature source Oral, resp. rate 20, height 5' 3.5" (1.613 m), weight 109 lb 14.4 oz (49.9 kg), SpO2 97 %.  LABORATORY DATA: Lab Results  Component Value Date   WBC 13.3 (H) 03/25/2017   HGB 11.1 (L) 03/25/2017   HCT 34.5 (L) 03/25/2017   MCV 96.1 03/25/2017   PLT 307 03/25/2017      Chemistry      Component Value Date/Time   NA 143 03/25/2017 1028   K 4.4 03/25/2017 1028   CL 105 03/25/2017 1028   CO2 29 03/25/2017 1028   BUN 22 03/25/2017 1028   CREATININE 1.39 (H) 03/25/2017 1028      Component Value Date/Time   CALCIUM 9.5 03/25/2017 1028   ALKPHOS 63 03/25/2017 1028   AST 20 03/25/2017 1028   ALT 20 03/25/2017 1028   BILITOT  0.3 03/25/2017 1028   BILITOT 0.4 10/31/2016 0852       RADIOGRAPHIC STUDIES: Ct Abdomen Pelvis Wo Contrast  Result Date: 03/25/2017 CLINICAL DATA:  Stage IV squamous cell left lung carcinoma diagnosed October 2015 treated with immunotherapy until May 2016. Interval restaging on observation. EXAM: CT CHEST, ABDOMEN AND PELVIS WITHOUT CONTRAST TECHNIQUE: Multidetector CT imaging of the chest, abdomen and pelvis was performed following the standard protocol without IV contrast. COMPARISON:  10/18/2016 CT chest, abdomen and pelvis. FINDINGS: CT CHEST FINDINGS Cardiovascular: Normal heart size. No significant pericardial fluid/thickening. Left main and 3 vessel coronary atherosclerosis status post CABG. Atherosclerotic nonaneurysmal thoracic aorta. Normal  caliber pulmonary arteries. Left internal jugular MediPort terminates at the cavoatrial junction. Mediastinum/Nodes: No discrete thyroid nodules. Mildly patulous thoracic esophagus filled with oral contrast. No appreciable esophageal wall thickening. No pathologically enlarged axillary, mediastinal or gross hilar lymph nodes, noting limited sensitivity for the detection of hilar adenopathy on this noncontrast study. Lungs/Pleura: No pneumothorax. No pleural effusion. Severe centrilobular emphysema with diffuse bronchial wall thickening. There is new patchy nodular peribronchovascular consolidation in the posterior right lower lobe measuring up to the 1.0 cm (series 6/image 80). Stable mildly thickened apical left upper lobe parenchymal band. Previously described 3.2 x 1.5 cm mildly complex bullous lesion in the medial left upper lobe measures 3.2 x 1.5 cm on today's scan (series 6/image 50), unchanged, and demonstrates stable mild irregular wall thickening. Stable minimal patchy tree-in-bud type opacity in medial superior segment left lower lobe (series 6/image 63), probably postinflammatory. Otherwise no new significant nodularity. Musculoskeletal: No  aggressive appearing focal osseous lesions. Intact sternotomy wires. Stable healed deformities in multiple posterolateral right mid ribs. Moderate thoracic spondylosis. CT ABDOMEN PELVIS FINDINGS Hepatobiliary: Normal liver size. Subcentimeter hypodense posterolateral segment left liver lobe lesion is too small to characterize and stable. No new liver lesions. Normal gallbladder with no radiopaque cholelithiasis. No biliary ductal dilatation. Pancreas: Normal, with no mass or duct dilation. Spleen: Normal size spleen. No splenic mass. Stable granulomatous splenic calcification. Adrenals/Urinary Tract: Normal adrenals. No hydronephrosis. No renal stones. Stable subcentimeter hypodense interpolar right renal cortical lesion, probably benign. No additional contour deforming renal lesions. Normal bladder. Stomach/Bowel: Normal non-distended stomach. Normal caliber small bowel with no small bowel wall thickening. Normal appendix. Marked sigmoid diverticulosis, with no definite large bowel wall thickening or acute pericolonic fat stranding. Oral contrast progresses to the sigmoid colon. Vascular/Lymphatic: Atherosclerotic nonaneurysmal abdominal aorta. No pathologically enlarged lymph nodes in the abdomen or pelvis. Reproductive: Status post hysterectomy, with no abnormal findings at the vaginal cuff. No adnexal mass. Other: No pneumoperitoneum, ascites or focal fluid collection. Musculoskeletal: No aggressive appearing focal osseous lesions. Stable advanced hip osteoarthritis, left greater than right. Moderate lumbar spondylosis. IMPRESSION: 1. New patchy nodular peribronchovascular consolidation in the posterior right lower lobe, indeterminate, favor bronchopneumonia. Recommend attention on follow-up chest CT in 3 months. 2. Previously described mildly complex bullous lesion in the medial left upper lung lobe is stable. 3. Otherwise no potential findings of new or progressive metastatic disease. 4. Chronic findings  include: Aortic Atherosclerosis (ICD10-I70.0) and Emphysema (ICD10-J43.9). Marked sigmoid diverticulosis. Advanced bilateral hip osteoarthritis, left greater than right. Electronically Signed   By: Ilona Sorrel M.D.   On: 03/25/2017 15:24   Ct Chest Wo Contrast  Result Date: 03/25/2017 CLINICAL DATA:  Stage IV squamous cell left lung carcinoma diagnosed October 2015 treated with immunotherapy until May 2016. Interval restaging on observation. EXAM: CT CHEST, ABDOMEN AND PELVIS WITHOUT CONTRAST TECHNIQUE: Multidetector CT imaging of the chest, abdomen and pelvis was performed following the standard protocol without IV contrast. COMPARISON:  10/18/2016 CT chest, abdomen and pelvis. FINDINGS: CT CHEST FINDINGS Cardiovascular: Normal heart size. No significant pericardial fluid/thickening. Left main and 3 vessel coronary atherosclerosis status post CABG. Atherosclerotic nonaneurysmal thoracic aorta. Normal caliber pulmonary arteries. Left internal jugular MediPort terminates at the cavoatrial junction. Mediastinum/Nodes: No discrete thyroid nodules. Mildly patulous thoracic esophagus filled with oral contrast. No appreciable esophageal wall thickening. No pathologically enlarged axillary, mediastinal or gross hilar lymph nodes, noting limited sensitivity for the detection of hilar adenopathy on this noncontrast study. Lungs/Pleura: No pneumothorax. No pleural effusion. Severe centrilobular  emphysema with diffuse bronchial wall thickening. There is new patchy nodular peribronchovascular consolidation in the posterior right lower lobe measuring up to the 1.0 cm (series 6/image 80). Stable mildly thickened apical left upper lobe parenchymal band. Previously described 3.2 x 1.5 cm mildly complex bullous lesion in the medial left upper lobe measures 3.2 x 1.5 cm on today's scan (series 6/image 50), unchanged, and demonstrates stable mild irregular wall thickening. Stable minimal patchy tree-in-bud type opacity in medial  superior segment left lower lobe (series 6/image 63), probably postinflammatory. Otherwise no new significant nodularity. Musculoskeletal: No aggressive appearing focal osseous lesions. Intact sternotomy wires. Stable healed deformities in multiple posterolateral right mid ribs. Moderate thoracic spondylosis. CT ABDOMEN PELVIS FINDINGS Hepatobiliary: Normal liver size. Subcentimeter hypodense posterolateral segment left liver lobe lesion is too small to characterize and stable. No new liver lesions. Normal gallbladder with no radiopaque cholelithiasis. No biliary ductal dilatation. Pancreas: Normal, with no mass or duct dilation. Spleen: Normal size spleen. No splenic mass. Stable granulomatous splenic calcification. Adrenals/Urinary Tract: Normal adrenals. No hydronephrosis. No renal stones. Stable subcentimeter hypodense interpolar right renal cortical lesion, probably benign. No additional contour deforming renal lesions. Normal bladder. Stomach/Bowel: Normal non-distended stomach. Normal caliber small bowel with no small bowel wall thickening. Normal appendix. Marked sigmoid diverticulosis, with no definite large bowel wall thickening or acute pericolonic fat stranding. Oral contrast progresses to the sigmoid colon. Vascular/Lymphatic: Atherosclerotic nonaneurysmal abdominal aorta. No pathologically enlarged lymph nodes in the abdomen or pelvis. Reproductive: Status post hysterectomy, with no abnormal findings at the vaginal cuff. No adnexal mass. Other: No pneumoperitoneum, ascites or focal fluid collection. Musculoskeletal: No aggressive appearing focal osseous lesions. Stable advanced hip osteoarthritis, left greater than right. Moderate lumbar spondylosis. IMPRESSION: 1. New patchy nodular peribronchovascular consolidation in the posterior right lower lobe, indeterminate, favor bronchopneumonia. Recommend attention on follow-up chest CT in 3 months. 2. Previously described mildly complex bullous lesion in  the medial left upper lung lobe is stable. 3. Otherwise no potential findings of new or progressive metastatic disease. 4. Chronic findings include: Aortic Atherosclerosis (ICD10-I70.0) and Emphysema (ICD10-J43.9). Marked sigmoid diverticulosis. Advanced bilateral hip osteoarthritis, left greater than right. Electronically Signed   By: Ilona Sorrel M.D.   On: 03/25/2017 15:24    ASSESSMENT AND PLAN: This is a very pleasant 83 years old white female with a stage IV non-small cell lung cancer status post treatment with Nivolumab for 11 cycles discontinued in May 2016 secondary to intolerance but the patient had good response to the treatment at that time.  She is currently on observation. The recent CT scan of the chest, abdomen and pelvis showed no concerning findings for disease recurrence but the patient has airspace disease and consolidation in the right lower lobe suspicious for inflammatory process. I discussed the scan results with the patient and her son. I recommended for her to continue in observation with repeat CT scan of the chest in 3 months for reevaluation of her disease. For the suspicious area space disease in the right lower lobe, I started the patient on doxycycline 100 mg p.o. twice daily for 7 days. She was advised to call immediately if she has any concerning symptoms in the interval. The patient voices understanding of current disease status and treatment options and is in agreement with the current care plan.  All questions were answered. The patient knows to call the clinic with any problems, questions or concerns. We can certainly see the patient much sooner if necessary.  I spent 10  minutes counseling the patient face to face. The total time spent in the appointment was 15 minutes.  Disclaimer: This note was dictated with voice recognition software. Similar sounding words can inadvertently be transcribed and may not be corrected upon review.

## 2017-03-27 NOTE — Telephone Encounter (Signed)
Appointments scheduled AVS/Calendar printed/  I sent in basket mesasge to MM regarding order for CT that was not in when patient scheduled per 3/20 los

## 2017-03-29 ENCOUNTER — Encounter: Payer: Self-pay | Admitting: Internal Medicine

## 2017-04-03 ENCOUNTER — Telehealth: Payer: Self-pay | Admitting: *Deleted

## 2017-04-03 NOTE — Telephone Encounter (Signed)
Patient's son came in to verify the patient's medications and to see when the patient needs to be seen. He will make an appointment for a follow up for his mother and medications have been verified.

## 2017-04-10 ENCOUNTER — Telehealth: Payer: Self-pay | Admitting: Internal Medicine

## 2017-04-10 DIAGNOSIS — J9611 Chronic respiratory failure with hypoxia: Secondary | ICD-10-CM

## 2017-04-10 DIAGNOSIS — J449 Chronic obstructive pulmonary disease, unspecified: Secondary | ICD-10-CM

## 2017-04-10 NOTE — Telephone Encounter (Signed)
Jamala Kohen Scully \ ono  03/28/17 0 </-= 88% for 3h and 18 sec. STrt 2L [place] at night  Dr. Brand Males, M.D., [facility].C.P Pulmonary and Critical Care Medicine Staff Physician, [place] Director - Interstitial Lung Disease  Program  Pulmonary [place] at [place], [place], [address]  Pager: [phone], If no answer or between  15:00h - 7:00h: call [phone] Telephone: [phone]

## 2017-04-16 NOTE — Telephone Encounter (Signed)
Called and spoke with pt's son Shanon Brow letting him know the results of pt's ONO and that we were going to get her started on O2 at night.  Shanon Brow expressed understanding. Order placed for pt to begin O2 at night.  Routing message to MR as an FYI for him to sign the O2 order for pt.

## 2017-04-19 NOTE — Telephone Encounter (Signed)
I think I signed it  Dr. Brand Males, M.D., [facility].C.P Pulmonary and Critical Care Medicine Staff Physician, [place] Director - Interstitial Lung Disease  Program  Pulmonary [place] at [place], [place], [address]  Pager: [phone], If no answer or between  15:00h - 7:00h: call [phone] Telephone: [phone]  '

## 2017-05-13 ENCOUNTER — Emergency Department (HOSPITAL_COMMUNITY): Payer: Medicare Other

## 2017-05-13 ENCOUNTER — Inpatient Hospital Stay (HOSPITAL_COMMUNITY)
Admission: EM | Admit: 2017-05-13 | Discharge: 2017-05-20 | DRG: 193 | Disposition: A | Discharge status: Nursing Home (Includes ICF) | Payer: Medicare Other | Attending: Internal Medicine | Admitting: Internal Medicine

## 2017-05-13 ENCOUNTER — Other Ambulatory Visit: Payer: Self-pay

## 2017-05-13 DIAGNOSIS — Z9221 Personal history of antineoplastic chemotherapy: Secondary | ICD-10-CM

## 2017-05-13 DIAGNOSIS — C3492 Malignant neoplasm of unspecified part of left bronchus or lung: Secondary | ICD-10-CM

## 2017-05-13 DIAGNOSIS — N183 Chronic kidney disease, stage 3 unspecified: Secondary | ICD-10-CM | POA: Diagnosis present

## 2017-05-13 DIAGNOSIS — F419 Anxiety disorder, unspecified: Secondary | ICD-10-CM | POA: Diagnosis present

## 2017-05-13 DIAGNOSIS — I252 Old myocardial infarction: Secondary | ICD-10-CM

## 2017-05-13 DIAGNOSIS — Z9981 Dependence on supplemental oxygen: Secondary | ICD-10-CM

## 2017-05-13 DIAGNOSIS — Z79890 Hormone replacement therapy (postmenopausal): Secondary | ICD-10-CM

## 2017-05-13 DIAGNOSIS — Z8719 Personal history of other diseases of the digestive system: Secondary | ICD-10-CM

## 2017-05-13 DIAGNOSIS — C3490 Malignant neoplasm of unspecified part of unspecified bronchus or lung: Secondary | ICD-10-CM

## 2017-05-13 DIAGNOSIS — J9601 Acute respiratory failure with hypoxia: Secondary | ICD-10-CM | POA: Diagnosis present

## 2017-05-13 DIAGNOSIS — I2510 Atherosclerotic heart disease of native coronary artery without angina pectoris: Secondary | ICD-10-CM | POA: Diagnosis present

## 2017-05-13 DIAGNOSIS — M25551 Pain in right hip: Secondary | ICD-10-CM | POA: Diagnosis present

## 2017-05-13 DIAGNOSIS — Z882 Allergy status to sulfonamides status: Secondary | ICD-10-CM

## 2017-05-13 DIAGNOSIS — J9621 Acute and chronic respiratory failure with hypoxia: Secondary | ICD-10-CM | POA: Diagnosis present

## 2017-05-13 DIAGNOSIS — J441 Chronic obstructive pulmonary disease with (acute) exacerbation: Secondary | ICD-10-CM

## 2017-05-13 DIAGNOSIS — D631 Anemia in chronic kidney disease: Secondary | ICD-10-CM | POA: Diagnosis present

## 2017-05-13 DIAGNOSIS — J181 Lobar pneumonia, unspecified organism: Principal | ICD-10-CM | POA: Diagnosis present

## 2017-05-13 DIAGNOSIS — I480 Paroxysmal atrial fibrillation: Secondary | ICD-10-CM | POA: Diagnosis present

## 2017-05-13 DIAGNOSIS — E785 Hyperlipidemia, unspecified: Secondary | ICD-10-CM | POA: Diagnosis present

## 2017-05-13 DIAGNOSIS — Z515 Encounter for palliative care: Secondary | ICD-10-CM

## 2017-05-13 DIAGNOSIS — J440 Chronic obstructive pulmonary disease with acute lower respiratory infection: Secondary | ICD-10-CM | POA: Diagnosis present

## 2017-05-13 DIAGNOSIS — J189 Pneumonia, unspecified organism: Secondary | ICD-10-CM

## 2017-05-13 DIAGNOSIS — Z7952 Long term (current) use of systemic steroids: Secondary | ICD-10-CM

## 2017-05-13 DIAGNOSIS — E039 Hypothyroidism, unspecified: Secondary | ICD-10-CM | POA: Diagnosis present

## 2017-05-13 DIAGNOSIS — Z7982 Long term (current) use of aspirin: Secondary | ICD-10-CM

## 2017-05-13 DIAGNOSIS — Z951 Presence of aortocoronary bypass graft: Secondary | ICD-10-CM

## 2017-05-13 DIAGNOSIS — Z7189 Other specified counseling: Secondary | ICD-10-CM

## 2017-05-13 DIAGNOSIS — F329 Major depressive disorder, single episode, unspecified: Secondary | ICD-10-CM | POA: Diagnosis present

## 2017-05-13 DIAGNOSIS — F0390 Unspecified dementia without behavioral disturbance: Secondary | ICD-10-CM | POA: Diagnosis present

## 2017-05-13 DIAGNOSIS — R0602 Shortness of breath: Secondary | ICD-10-CM | POA: Diagnosis not present

## 2017-05-13 DIAGNOSIS — Z91041 Radiographic dye allergy status: Secondary | ICD-10-CM

## 2017-05-13 DIAGNOSIS — Z79899 Other long term (current) drug therapy: Secondary | ICD-10-CM

## 2017-05-13 DIAGNOSIS — J449 Chronic obstructive pulmonary disease, unspecified: Secondary | ICD-10-CM | POA: Diagnosis present

## 2017-05-13 DIAGNOSIS — I2699 Other pulmonary embolism without acute cor pulmonale: Secondary | ICD-10-CM | POA: Diagnosis present

## 2017-05-13 DIAGNOSIS — Z993 Dependence on wheelchair: Secondary | ICD-10-CM

## 2017-05-13 DIAGNOSIS — I5032 Chronic diastolic (congestive) heart failure: Secondary | ICD-10-CM | POA: Diagnosis present

## 2017-05-13 DIAGNOSIS — Z7951 Long term (current) use of inhaled steroids: Secondary | ICD-10-CM

## 2017-05-13 DIAGNOSIS — M069 Rheumatoid arthritis, unspecified: Secondary | ICD-10-CM | POA: Diagnosis present

## 2017-05-13 DIAGNOSIS — Z66 Do not resuscitate: Secondary | ICD-10-CM | POA: Diagnosis present

## 2017-05-13 DIAGNOSIS — Z87891 Personal history of nicotine dependence: Secondary | ICD-10-CM

## 2017-05-13 LAB — BASIC METABOLIC PANEL
ANION GAP: 10 (ref 5–15)
BUN: 25 mg/dL — AB (ref 6–20)
CHLORIDE: 101 mmol/L (ref 101–111)
CO2: 29 mmol/L (ref 22–32)
Calcium: 8.9 mg/dL (ref 8.9–10.3)
Creatinine, Ser: 1.6 mg/dL — ABNORMAL HIGH (ref 0.44–1.00)
GFR calc Af Amer: 34 mL/min — ABNORMAL LOW (ref >60)
GFR calc non Af Amer: 29 mL/min — ABNORMAL LOW (ref >60)
Glucose, Bld: 122 mg/dL — ABNORMAL HIGH (ref 65–99)
Potassium: 4.1 mmol/L (ref 3.5–5.1)
SODIUM: 140 mmol/L (ref 135–145)

## 2017-05-13 LAB — CBC WITH DIFFERENTIAL/PLATELET
Basophils Absolute: 0 10*3/uL (ref 0.0–0.1)
Basophils Relative: 0 %
EOS PCT: 1 %
Eosinophils Absolute: 0.1 10*3/uL (ref 0.0–0.7)
HEMATOCRIT: 35.5 % — AB (ref 36.0–46.0)
HEMOGLOBIN: 10.8 g/dL — AB (ref 12.0–15.0)
LYMPHS ABS: 2.2 10*3/uL (ref 0.7–4.0)
LYMPHS PCT: 26 %
MCH: 30.7 pg (ref 26.0–34.0)
MCHC: 30.4 g/dL (ref 30.0–36.0)
MCV: 100.9 fL — AB (ref 78.0–100.0)
Monocytes Absolute: 0.7 10*3/uL (ref 0.1–1.0)
Monocytes Relative: 8 %
NEUTROS ABS: 5.4 10*3/uL (ref 1.7–7.7)
Neutrophils Relative %: 65 %
PLATELETS: 255 10*3/uL (ref 150–400)
RBC: 3.52 MIL/uL — AB (ref 3.87–5.11)
RDW: 14.9 % (ref 11.5–15.5)
WBC: 8.4 10*3/uL (ref 4.0–10.5)

## 2017-05-13 LAB — I-STAT TROPONIN, ED: Troponin i, poc: 0 ng/mL (ref 0.00–0.08)

## 2017-05-13 LAB — BRAIN NATRIURETIC PEPTIDE: B NATRIURETIC PEPTIDE 5: 196.1 pg/mL — AB (ref 0.0–100.0)

## 2017-05-13 LAB — I-STAT CG4 LACTIC ACID, ED: LACTIC ACID, VENOUS: 1.57 mmol/L (ref 0.5–1.9)

## 2017-05-13 MED ORDER — SODIUM CHLORIDE 0.9 % IV SOLN
500.0000 mg | Freq: Once | INTRAVENOUS | Status: AC
  Administered 2017-05-13: 500 mg via INTRAVENOUS
  Filled 2017-05-13: qty 500

## 2017-05-13 MED ORDER — SODIUM CHLORIDE 0.9 % IV SOLN
1.0000 g | Freq: Once | INTRAVENOUS | Status: AC
  Administered 2017-05-13: 1 g via INTRAVENOUS
  Filled 2017-05-13: qty 10

## 2017-05-13 NOTE — ED Notes (Addendum)
Thomes Dinning [phone]

## 2017-05-13 NOTE — ED Provider Notes (Signed)
[facility] EMERGENCY DEPARTMENT Provider Note   CSN: [id number] Arrival date & time: 05/13/17  1805     History   Chief Complaint Chief Complaint  Patient presents with  . Shortness of Breath    HPI Kristin Peterson is a 83 y.o. female.  Pt presents to the ED today with sob and cough.  The pt said she wears 2L normally only at night.  She started having sob today with cough.  The pt's O2 sats were in the 80s when EMS arrived.  She was given 10 mg albuterol/1 mg atrovent, and 125 mg solumedrol.  The pt is breathing a little easier, but is still sob.     Past Medical History:  Diagnosis Date  . Allergy   . Anxiety   . Arthritis   . CAD (coronary artery disease)   . Cataract   . COPD (chronic obstructive pulmonary disease) ([place])   . Dementia   . Depression   . Myocardial infarction ([place])   . Stage IV squamous cell carcinoma of left lung ([place]) 09/25/2016    Patient Active Problem List   Diagnosis Date Noted  . Chronic obstructive pulmonary disease ([place]) 09/27/2016  . Stage IV squamous cell carcinoma of left lung ([place]) 09/25/2016    Past Surgical History:  Procedure Laterality Date  . ABDOMINAL HYSTERECTOMY    . CORONARY ARTERY BYPASS GRAFT    . OOPHORECTOMY    . TONSILLECTOMY       OB History   None      Home Medications    Prior to Admission medications   Medication Sig Start Date End Date Taking? Authorizing Provider  acetaminophen (TYLENOL) 650 MG CR tablet Take 650 mg by mouth every 8 (eight) hours as needed for pain.    [provider]  aspirin EC 81 MG tablet Take 1 tablet (81 mg total) by mouth daily. 10/04/16   Lelon Perla, MD  atorvastatin (LIPITOR) 40 MG tablet Take 1 tablet (40 mg total) by mouth daily. 10/04/16 10/04/17  Lelon Perla, MD  budesonide-formoterol (SYMBICORT) 160-4.5 MCG/ACT inhaler Inhale 2 puffs into the lungs 2 (two) times daily.    [provider]  clonazePAM (KLONOPIN) 0.5 MG tablet  Take 0.5 mg by mouth daily as needed for anxiety.     [provider]  Coenzyme Q-10 200 MG CAPS Take by mouth.    [provider]  dofetilide (TIKOSYN) 125 MCG capsule Take 125 mcg by mouth 2 (two) times daily.    [provider]  FeFum-FePo-FA-B Cmp-C-Zn-Mn-Cu (TANDEM PLUS PO) Take 1 tablet by mouth daily.    [provider]  furosemide (LASIX) 40 MG tablet Take 40 mg by mouth.    [provider]  gabapentin (NEURONTIN) 100 MG capsule Take 100 mg by mouth 3 (three) times daily.    [provider]  hydroxychloroquine (PLAQUENIL) 200 MG tablet Take by mouth daily.    [provider]  hydrOXYzine (ATARAX/VISTARIL) 25 MG tablet Take 25 mg by mouth 3 (three) times daily as needed.    [provider]  levothyroxine (SYNTHROID, LEVOTHROID) 88 MCG tablet Take 88 mcg by mouth daily before breakfast.    [provider]  loperamide (IMODIUM A-D) 2 MG tablet Take 2 mg by mouth 4 (four) times daily as needed for diarrhea or loose stools.    [provider]  loratadine (CLARITIN) 10 MG tablet Take 10 mg by mouth daily.    [provider]  memantine (NAMENDA) 5 MG tablet Take 5 mg by mouth 2 (two) times daily.    [provider]  metoprolol tartrate (LOPRESSOR) 25 MG tablet Take 25 mg by mouth 2 (two) times daily.    [provider]  mirtazapine (REMERON) 15 MG tablet Take 15 mg by mouth at bedtime.    [provider]  mupirocin ointment (BACTROBAN) 2 % Place 1 application into the nose 2 (two) times daily.    [provider]  pantoprazole (PROTONIX) 40 MG tablet Take 40 mg by mouth daily.    [provider]  potassium chloride SA (K-DUR,KLOR-CON) 20 MEQ tablet Take 20 mEq by mouth 2 (two) times daily.    [provider]  predniSONE (DELTASONE) 5 MG tablet Take 5 mg by mouth daily with breakfast.    [provider]  rivastigmine (EXELON) 4.6 mg/24hr  Place 4.6 mg onto the skin daily.    [provider]  sertraline (ZOLOFT) 100 MG tablet Take 100 mg by mouth daily.    [provider]  sodium chloride 1 g tablet Take 1 g by mouth 2 (two) times daily with a meal.    [provider]  tiotropium (SPIRIVA) 18 MCG inhalation capsule Place 18 mcg into inhaler and inhale daily.    [provider]  traMADol (ULTRAM) 50 MG tablet Take by mouth 2 (two) times daily.    [provider]    Family History Family History  Problem Relation Age of Onset  . Parkinsonism Father     Social History Social History   Tobacco Use  . Smoking status: Former Smoker    Packs/day: 1.00    Years: 40.00    Pack years: 40.00    Types: Cigarettes    Last attempt to quit: 03/22/1996    Years since quitting: 21.1  . Smokeless tobacco: Never Used  Substance Use Topics  . Alcohol use: No  . Drug use: No     Allergies   Sulfa antibiotics and Contrast media [iodinated diagnostic agents]   Review of Systems Review of Systems  Respiratory: Positive for cough and shortness of breath.   All other systems reviewed and are negative.    Physical Exam Updated Vital Signs BP (!) 104/46   Pulse 78   Temp 98.1 F (36.7 C) (Oral)   Resp 13   Ht 5\' 4"  (1.626 m)   Wt 49.9 kg (110 lb)   SpO2 94%   BMI 18.88 kg/m   Physical Exam  Constitutional: She is oriented to person, place, and time. She appears well-developed and well-nourished. She appears distressed.  HENT:  Head: Normocephalic and atraumatic.  Mouth/Throat: Oropharynx is clear and moist.  Eyes: Pupils are equal, round, and reactive to light. EOM are normal.  Neck: Normal range of motion. Neck supple.  Cardiovascular: Normal rate and regular rhythm.  Pulmonary/Chest: Accessory muscle usage present. Tachypnea noted. She is in respiratory distress. She has wheezes.  Abdominal: Soft. Bowel sounds are normal.  Musculoskeletal: Normal range of motion.        Right lower leg: Normal.       Left lower leg: Normal.  Neurological: She is alert and oriented to person, place, and time.  Skin: Skin is warm and dry. Capillary refill takes less than 2 seconds.  Psychiatric: She has a normal mood and affect. Her behavior is normal.  Nursing note and vitals reviewed.    ED Treatments / Results  Labs (all labs ordered  are listed, but only abnormal results are displayed) Labs Reviewed  BASIC METABOLIC PANEL - Abnormal; Notable for the following components:      Result Value   Glucose, Bld 122 (*)    BUN 25 (*)    Creatinine, Ser 1.60 (*)    GFR calc non Af Amer 29 (*)    GFR calc Af Amer 34 (*)    All other components within normal limits  CBC WITH DIFFERENTIAL/PLATELET - Abnormal; Notable for the following components:   RBC 3.52 (*)    Hemoglobin 10.8 (*)    HCT 35.5 (*)    MCV 100.9 (*)    All other components within normal limits  BRAIN NATRIURETIC PEPTIDE - Abnormal; Notable for the following components:   B Natriuretic Peptide 196.1 (*)    All other components within normal limits  CULTURE, BLOOD (ROUTINE X 2)  CULTURE, BLOOD (ROUTINE X 2)  I-STAT TROPONIN, ED  I-STAT CG4 LACTIC ACID, ED    EKG EKG Interpretation  Date/Time:  Monday May 13 2017 18:24:45 EDT Ventricular Rate:  82 PR Interval:    QRS Duration: 131 QT Interval:  403 QTC Calculation: 471 R Axis:   93 Text Interpretation:  Right and left arm electrode reversal, interpretation assumes no reversal Sinus rhythm Borderline short PR interval Nonspecific intraventricular conduction delay Anterior infarct, acute (LAD) No old tracing to compare Confirmed by Isla Pence [phone]) on 05/13/2017 6:31:46 PM   Radiology Dg Chest 2 View  Result Date: 05/13/2017 CLINICAL DATA:  Shortness of breath.  COPD. EXAM: CHEST - 2 VIEW COMPARISON:  CT chest 03/25/2017. FINDINGS: The heart size and mediastinal contours are within normal limits. Both lungs are free of consolidation or edema.  The visualized skeletal structures are unremarkable. Old rib fractures. Prior CABG. Thoracic atherosclerosis. IMPRESSION: COPD.  No active disease. Electronically Signed   By: Staci Righter M.D.   On: 05/13/2017 19:05   Ct Chest Wo Contrast  Result Date: 05/13/2017 CLINICAL DATA:  Shortness of breath. COPD. Stroke [place] cell carcinoma of the LEFT lung. EXAM: CT CHEST WITHOUT CONTRAST TECHNIQUE: Multidetector CT imaging of the chest was performed following the standard protocol without IV contrast. COMPARISON:  CT chest 03/25/2017.  Chest radiograph earlier today. FINDINGS: Cardiovascular: Heart size is within normal limits. No significant pericardial fluid or thickening. Three-vessel coronary atherosclerosis. Previous CABG. Atherosclerotic nonaneurysmal thoracic aorta. Pulmonary artery caliber is within normal limits. LEFT internal jugular Port-A-Cath terminates at the cavoatrial junction. Mediastinum/Nodes: No discrete thyroid nodules. Unremarkable esophagus. No pathologically enlarged nodes, within limits for detection on noncontrast exam. Lungs/Pleura: No pneumothorax or pleural effusion. Severe centrilobular emphysema. New patchy nodular peribronchial consolidation in the lateral basal segment RIGHT lower lobe. Previous posterior basal infiltrate is improved. Stable mildly thickened apical LEFT upper lobe parenchymal band. Stable bullous lesion medial LEFT upper lobe. Upper Abdomen: No acute findings. Musculoskeletal: Old rib fractures.  Thoracic spondylosis. IMPRESSION: New patchy nodular airspace opacities RIGHT lower lobe lateral basal segment consistent with acute pneumonia. This is difficult to appreciate on the earlier chest radiograph but represents a change from previous CT in March. There is resolution of the previous RIGHT lower lobe posterior basal segment infiltrate. Aortic Atherosclerosis (ICD10-I70.0) and Emphysema (ICD10-J43.9). Electronically Signed   By: Staci Righter M.D.   On: 05/13/2017  22:09    Procedures Procedures (including critical care time)  Medications Ordered in ED Medications  cefTRIAXone (ROCEPHIN) 1 g in sodium chloride 0.9 % 100 mL IVPB (1 g Intravenous New Bag/Given  05/13/17 2231)  azithromycin (ZITHROMAX) 500 mg in sodium chloride 0.9 % 250 mL IVPB (500 mg Intravenous New Bag/Given 05/13/17 2230)     Initial Impression / Assessment and Plan / ED Course  I have reviewed the triage vital signs and the nursing notes.  Pertinent labs & imaging results that were available during my care of the patient were reviewed by me and considered in my medical decision making (see chart for details).  Pt placed on oxygen as her sats drop when off oxygen.  She is breathing easier after nebs and solumedrol.  She was given IVFs and IV rocephin/zithromax for pna.  Pt d/w Dr. Hal Hope for admission.    CRITICAL CARE Performed by: Isla Pence   Total critical care time: 30 minutes  Critical care time was exclusive of separately billable procedures and treating other patients.  Critical care was necessary to treat or prevent imminent or life-threatening deterioration.  Critical care was time spent personally by me on the following activities: development of treatment plan with patient and/or surrogate as well as nursing, discussions with consultants, evaluation of patient's response to treatment, examination of patient, obtaining history from patient or surrogate, ordering and performing treatments and interventions, ordering and review of laboratory studies, ordering and review of radiographic studies, pulse oximetry and re-evaluation of patient's condition.  Final Clinical Impressions(s) / ED Diagnoses   Final diagnoses:  COPD exacerbation ([place])  Community acquired pneumonia of right lower lobe of lung ([place])  Acute respiratory failure with hypoxia [facility])    ED Discharge Orders    None       Isla Pence, MD 05/13/17 2236

## 2017-05-13 NOTE — ED Notes (Signed)
Noticed Pt's Pulse Ox reading 82% with good wave form. Went to check on Pt. Pt alert and talking and stated she was not feeling short of breath. Placed Pt on 2L Nasal O2.

## 2017-05-13 NOTE — ED Notes (Signed)
Patient transported to X-ray 

## 2017-05-13 NOTE — ED Triage Notes (Signed)
Patient arrived via [facility] from [place]. Complaints of shortness of breath. HX of COPD. Wears 2L O2 at night. Strong nonproductive present. O2 stats in 80's upon EMS arrival. [place] provided: Atrovent, Albuterol 10 mg, solumedrol.

## 2017-05-14 ENCOUNTER — Inpatient Hospital Stay (HOSPITAL_COMMUNITY): Payer: Medicare Other

## 2017-05-14 ENCOUNTER — Encounter (HOSPITAL_COMMUNITY): Payer: Self-pay | Admitting: Internal Medicine

## 2017-05-14 DIAGNOSIS — Z515 Encounter for palliative care: Secondary | ICD-10-CM | POA: Diagnosis not present

## 2017-05-14 DIAGNOSIS — N183 Chronic kidney disease, stage 3 unspecified: Secondary | ICD-10-CM | POA: Diagnosis present

## 2017-05-14 DIAGNOSIS — D631 Anemia in chronic kidney disease: Secondary | ICD-10-CM | POA: Diagnosis present

## 2017-05-14 DIAGNOSIS — Z66 Do not resuscitate: Secondary | ICD-10-CM | POA: Diagnosis present

## 2017-05-14 DIAGNOSIS — Z993 Dependence on wheelchair: Secondary | ICD-10-CM | POA: Diagnosis not present

## 2017-05-14 DIAGNOSIS — F0390 Unspecified dementia without behavioral disturbance: Secondary | ICD-10-CM | POA: Diagnosis present

## 2017-05-14 DIAGNOSIS — I2510 Atherosclerotic heart disease of native coronary artery without angina pectoris: Secondary | ICD-10-CM

## 2017-05-14 DIAGNOSIS — J9601 Acute respiratory failure with hypoxia: Secondary | ICD-10-CM | POA: Diagnosis present

## 2017-05-14 DIAGNOSIS — J189 Pneumonia, unspecified organism: Secondary | ICD-10-CM | POA: Diagnosis present

## 2017-05-14 DIAGNOSIS — Z7189 Other specified counseling: Secondary | ICD-10-CM | POA: Diagnosis not present

## 2017-05-14 DIAGNOSIS — J440 Chronic obstructive pulmonary disease with acute lower respiratory infection: Secondary | ICD-10-CM

## 2017-05-14 DIAGNOSIS — Z87891 Personal history of nicotine dependence: Secondary | ICD-10-CM | POA: Diagnosis not present

## 2017-05-14 DIAGNOSIS — C3492 Malignant neoplasm of unspecified part of left bronchus or lung: Secondary | ICD-10-CM | POA: Diagnosis present

## 2017-05-14 DIAGNOSIS — F329 Major depressive disorder, single episode, unspecified: Secondary | ICD-10-CM | POA: Diagnosis present

## 2017-05-14 DIAGNOSIS — J42 Unspecified chronic bronchitis: Secondary | ICD-10-CM | POA: Diagnosis not present

## 2017-05-14 DIAGNOSIS — I252 Old myocardial infarction: Secondary | ICD-10-CM | POA: Diagnosis not present

## 2017-05-14 DIAGNOSIS — I2699 Other pulmonary embolism without acute cor pulmonale: Secondary | ICD-10-CM | POA: Diagnosis present

## 2017-05-14 DIAGNOSIS — I480 Paroxysmal atrial fibrillation: Secondary | ICD-10-CM | POA: Diagnosis present

## 2017-05-14 DIAGNOSIS — J181 Lobar pneumonia, unspecified organism: Secondary | ICD-10-CM | POA: Diagnosis present

## 2017-05-14 DIAGNOSIS — I5032 Chronic diastolic (congestive) heart failure: Secondary | ICD-10-CM | POA: Diagnosis present

## 2017-05-14 DIAGNOSIS — J411 Mucopurulent chronic bronchitis: Secondary | ICD-10-CM | POA: Diagnosis not present

## 2017-05-14 DIAGNOSIS — F419 Anxiety disorder, unspecified: Secondary | ICD-10-CM | POA: Diagnosis present

## 2017-05-14 DIAGNOSIS — J9621 Acute and chronic respiratory failure with hypoxia: Secondary | ICD-10-CM | POA: Diagnosis present

## 2017-05-14 DIAGNOSIS — M069 Rheumatoid arthritis, unspecified: Secondary | ICD-10-CM | POA: Diagnosis present

## 2017-05-14 DIAGNOSIS — M25551 Pain in right hip: Secondary | ICD-10-CM | POA: Diagnosis present

## 2017-05-14 DIAGNOSIS — E785 Hyperlipidemia, unspecified: Secondary | ICD-10-CM | POA: Diagnosis present

## 2017-05-14 DIAGNOSIS — Z951 Presence of aortocoronary bypass graft: Secondary | ICD-10-CM | POA: Diagnosis not present

## 2017-05-14 DIAGNOSIS — J441 Chronic obstructive pulmonary disease with (acute) exacerbation: Secondary | ICD-10-CM | POA: Diagnosis present

## 2017-05-14 DIAGNOSIS — R0602 Shortness of breath: Secondary | ICD-10-CM | POA: Diagnosis present

## 2017-05-14 DIAGNOSIS — E039 Hypothyroidism, unspecified: Secondary | ICD-10-CM | POA: Diagnosis present

## 2017-05-14 DIAGNOSIS — Z9981 Dependence on supplemental oxygen: Secondary | ICD-10-CM | POA: Diagnosis not present

## 2017-05-14 LAB — BASIC METABOLIC PANEL
Anion gap: 12 (ref 5–15)
BUN: 23 mg/dL — AB (ref 6–20)
CALCIUM: 8.9 mg/dL (ref 8.9–10.3)
CO2: 26 mmol/L (ref 22–32)
CREATININE: 1.59 mg/dL — AB (ref 0.44–1.00)
Chloride: 103 mmol/L (ref 101–111)
GFR calc Af Amer: 34 mL/min — ABNORMAL LOW (ref >60)
GFR, EST NON AFRICAN AMERICAN: 30 mL/min — AB (ref >60)
GLUCOSE: 137 mg/dL — AB (ref 65–99)
POTASSIUM: 4 mmol/L (ref 3.5–5.1)
SODIUM: 141 mmol/L (ref 135–145)

## 2017-05-14 LAB — TSH: TSH: 0.224 u[IU]/mL — ABNORMAL LOW (ref 0.350–4.500)

## 2017-05-14 LAB — CBC
HEMATOCRIT: 32.4 % — AB (ref 36.0–46.0)
HEMOGLOBIN: 10 g/dL — AB (ref 12.0–15.0)
MCH: 30.6 pg (ref 26.0–34.0)
MCHC: 30.9 g/dL (ref 30.0–36.0)
MCV: 99.1 fL (ref 78.0–100.0)
Platelets: 231 10*3/uL (ref 150–400)
RBC: 3.27 MIL/uL — ABNORMAL LOW (ref 3.87–5.11)
RDW: 14.7 % (ref 11.5–15.5)
WBC: 12.4 10*3/uL — AB (ref 4.0–10.5)

## 2017-05-14 LAB — TROPONIN I: TROPONIN I: 0.03 ng/mL — AB (ref <0.03)

## 2017-05-14 LAB — INFLUENZA PANEL BY PCR (TYPE A & B)
INFLAPCR: NEGATIVE
INFLBPCR: NEGATIVE

## 2017-05-14 LAB — D-DIMER, QUANTITATIVE (NOT AT ARMC): D DIMER QUANT: 0.87 ug{FEU}/mL — AB (ref 0.00–0.50)

## 2017-05-14 MED ORDER — GABAPENTIN 100 MG PO CAPS
100.0000 mg | ORAL_CAPSULE | Freq: Three times a day (TID) | ORAL | Status: DC
  Administered 2017-05-14 – 2017-05-20 (×19): 100 mg via ORAL
  Filled 2017-05-14 (×20): qty 1

## 2017-05-14 MED ORDER — HEPARIN (PORCINE) IN NACL 100-0.45 UNIT/ML-% IJ SOLN
800.0000 [IU]/h | INTRAMUSCULAR | Status: DC
  Administered 2017-05-14: 900 [IU]/h via INTRAVENOUS
  Filled 2017-05-14: qty 250

## 2017-05-14 MED ORDER — DOXYCYCLINE HYCLATE 100 MG PO TABS
100.0000 mg | ORAL_TABLET | Freq: Two times a day (BID) | ORAL | Status: DC
  Administered 2017-05-14 – 2017-05-20 (×12): 100 mg via ORAL
  Filled 2017-05-14 (×12): qty 1

## 2017-05-14 MED ORDER — ONDANSETRON HCL 4 MG PO TABS: 4.0000 mg | ORAL_TABLET | Freq: Four times a day (QID) | ORAL | Status: DC | PRN

## 2017-05-14 MED ORDER — ORAL CARE MOUTH RINSE
15.0000 mL | Freq: Two times a day (BID) | OROMUCOSAL | Status: DC
  Administered 2017-05-15 – 2017-05-20 (×10): 15 mL via OROMUCOSAL

## 2017-05-14 MED ORDER — SODIUM CHLORIDE 0.9 % IV SOLN: 1.0000 g | INTRAVENOUS | Status: DC

## 2017-05-14 MED ORDER — TRAMADOL HCL 50 MG PO TABS
50.0000 mg | ORAL_TABLET | Freq: Two times a day (BID) | ORAL | Status: DC
  Administered 2017-05-14 – 2017-05-20 (×13): 50 mg via ORAL
  Filled 2017-05-14 (×13): qty 1

## 2017-05-14 MED ORDER — PANTOPRAZOLE SODIUM 40 MG PO TBEC
40.0000 mg | DELAYED_RELEASE_TABLET | Freq: Every day | ORAL | Status: DC
  Administered 2017-05-14 – 2017-05-20 (×7): 40 mg via ORAL
  Filled 2017-05-14 (×7): qty 1

## 2017-05-14 MED ORDER — ENOXAPARIN SODIUM 30 MG/0.3ML ~~LOC~~ SOLN
30.0000 mg | SUBCUTANEOUS | Status: DC
  Administered 2017-05-14: 30 mg via SUBCUTANEOUS
  Filled 2017-05-14: qty 0.3

## 2017-05-14 MED ORDER — ACETAMINOPHEN 325 MG PO TABS
650.0000 mg | ORAL_TABLET | Freq: Four times a day (QID) | ORAL | Status: DC | PRN
  Administered 2017-05-14: 650 mg via ORAL
  Filled 2017-05-14 (×2): qty 2

## 2017-05-14 MED ORDER — POTASSIUM CHLORIDE CRYS ER 20 MEQ PO TBCR
20.0000 meq | EXTENDED_RELEASE_TABLET | Freq: Every day | ORAL | Status: DC
  Administered 2017-05-14 – 2017-05-18 (×5): 20 meq via ORAL
  Filled 2017-05-14 (×5): qty 1

## 2017-05-14 MED ORDER — ASPIRIN EC 81 MG PO TBEC
81.0000 mg | DELAYED_RELEASE_TABLET | Freq: Every day | ORAL | Status: DC
  Administered 2017-05-14 – 2017-05-20 (×7): 81 mg via ORAL
  Filled 2017-05-14 (×7): qty 1

## 2017-05-14 MED ORDER — FUROSEMIDE 40 MG PO TABS
40.0000 mg | ORAL_TABLET | Freq: Every day | ORAL | Status: DC
  Administered 2017-05-14 – 2017-05-18 (×5): 40 mg via ORAL
  Filled 2017-05-14 (×5): qty 1

## 2017-05-14 MED ORDER — ALBUTEROL SULFATE (2.5 MG/3ML) 0.083% IN NEBU
INHALATION_SOLUTION | RESPIRATORY_TRACT | Status: AC
  Filled 2017-05-14: qty 3

## 2017-05-14 MED ORDER — ALBUTEROL SULFATE (2.5 MG/3ML) 0.083% IN NEBU
2.5000 mg | INHALATION_SOLUTION | RESPIRATORY_TRACT | Status: DC | PRN
  Administered 2017-05-14 – 2017-05-18 (×4): 2.5 mg via RESPIRATORY_TRACT
  Filled 2017-05-14 (×4): qty 3

## 2017-05-14 MED ORDER — IPRATROPIUM BROMIDE 0.02 % IN SOLN
RESPIRATORY_TRACT | Status: AC
  Filled 2017-05-14: qty 2.5

## 2017-05-14 MED ORDER — IPRATROPIUM-ALBUTEROL 0.5-2.5 (3) MG/3ML IN SOLN
3.0000 mL | RESPIRATORY_TRACT | Status: DC
  Filled 2017-05-14: qty 3

## 2017-05-14 MED ORDER — LORATADINE 10 MG PO TABS
10.0000 mg | ORAL_TABLET | Freq: Every day | ORAL | Status: DC
  Administered 2017-05-14 – 2017-05-20 (×7): 10 mg via ORAL
  Filled 2017-05-14 (×7): qty 1

## 2017-05-14 MED ORDER — DOFETILIDE 125 MCG PO CAPS
125.0000 ug | ORAL_CAPSULE | Freq: Two times a day (BID) | ORAL | Status: DC
  Administered 2017-05-14 – 2017-05-20 (×13): 125 ug via ORAL
  Filled 2017-05-14 (×13): qty 1

## 2017-05-14 MED ORDER — HEPARIN BOLUS VIA INFUSION
3000.0000 [IU] | Freq: Once | INTRAVENOUS | Status: AC
  Administered 2017-05-14: 3000 [IU] via INTRAVENOUS
  Filled 2017-05-14: qty 3000

## 2017-05-14 MED ORDER — POLYETHYLENE GLYCOL 3350 17 G PO PACK: 17.0000 g | PACK | Freq: Every day | ORAL | Status: DC | PRN

## 2017-05-14 MED ORDER — SODIUM CHLORIDE 0.9 % IV SOLN
1.0000 g | INTRAVENOUS | Status: DC
  Administered 2017-05-14 – 2017-05-19 (×6): 1 g via INTRAVENOUS
  Filled 2017-05-14 (×7): qty 10

## 2017-05-14 MED ORDER — IPRATROPIUM-ALBUTEROL 0.5-2.5 (3) MG/3ML IN SOLN
3.0000 mL | Freq: Four times a day (QID) | RESPIRATORY_TRACT | Status: DC
  Administered 2017-05-14: 3 mL via RESPIRATORY_TRACT
  Filled 2017-05-14: qty 3

## 2017-05-14 MED ORDER — ONDANSETRON HCL 4 MG/2ML IJ SOLN: 4.0000 mg | Freq: Four times a day (QID) | INTRAMUSCULAR | Status: DC | PRN

## 2017-05-14 MED ORDER — ATORVASTATIN CALCIUM 40 MG PO TABS
40.0000 mg | ORAL_TABLET | Freq: Every day | ORAL | Status: DC
  Administered 2017-05-14 – 2017-05-20 (×7): 40 mg via ORAL
  Filled 2017-05-14 (×7): qty 1

## 2017-05-14 MED ORDER — IPRATROPIUM BROMIDE 0.02 % IN SOLN
0.5000 mg | RESPIRATORY_TRACT | Status: DC
  Administered 2017-05-14: 0.5 mg via RESPIRATORY_TRACT

## 2017-05-14 MED ORDER — SODIUM CHLORIDE 0.9 % IV SOLN
100.0000 mg | Freq: Two times a day (BID) | INTRAVENOUS | Status: DC
  Administered 2017-05-14: 100 mg via INTRAVENOUS
  Filled 2017-05-14 (×2): qty 100

## 2017-05-14 MED ORDER — CLONAZEPAM 0.125 MG PO TBDP
0.7500 mg | ORAL_TABLET | Freq: Every day | ORAL | Status: DC | PRN
  Administered 2017-05-14: 0.75 mg via ORAL
  Filled 2017-05-14: qty 6

## 2017-05-14 MED ORDER — CLONAZEPAM 0.125 MG PO TBDP
0.7500 mg | ORAL_TABLET | Freq: Every day | ORAL | Status: DC
  Administered 2017-05-14 – 2017-05-19 (×6): 0.75 mg via ORAL
  Filled 2017-05-14 (×6): qty 6

## 2017-05-14 MED ORDER — LEVOTHYROXINE SODIUM 88 MCG PO TABS
88.0000 ug | ORAL_TABLET | Freq: Every day | ORAL | Status: DC
  Administered 2017-05-14 – 2017-05-20 (×7): 88 ug via ORAL
  Filled 2017-05-14 (×7): qty 1

## 2017-05-14 MED ORDER — TECHNETIUM TO 99M ALBUMIN AGGREGATED
4.0000 | Freq: Once | INTRAVENOUS | Status: AC | PRN
  Administered 2017-05-14: 4 via INTRAVENOUS

## 2017-05-14 MED ORDER — HYDROXYCHLOROQUINE SULFATE 200 MG PO TABS
200.0000 mg | ORAL_TABLET | Freq: Every day | ORAL | Status: DC
  Administered 2017-05-14 – 2017-05-20 (×7): 200 mg via ORAL
  Filled 2017-05-14 (×7): qty 1

## 2017-05-14 MED ORDER — ACETAMINOPHEN 650 MG RE SUPP: 650.0000 mg | Freq: Four times a day (QID) | RECTAL | Status: DC | PRN

## 2017-05-14 MED ORDER — MEMANTINE HCL 10 MG PO TABS
10.0000 mg | ORAL_TABLET | Freq: Two times a day (BID) | ORAL | Status: DC
  Administered 2017-05-14 – 2017-05-20 (×13): 10 mg via ORAL
  Filled 2017-05-14 (×13): qty 1

## 2017-05-14 MED ORDER — RIVASTIGMINE 4.6 MG/24HR TD PT24
4.6000 mg | MEDICATED_PATCH | Freq: Every day | TRANSDERMAL | Status: DC
  Administered 2017-05-14 – 2017-05-20 (×7): 4.6 mg via TRANSDERMAL
  Filled 2017-05-14 (×7): qty 1

## 2017-05-14 MED ORDER — GUAIFENESIN 100 MG/5ML PO SYRP
100.0000 mg | ORAL_SOLUTION | Freq: Four times a day (QID) | ORAL | Status: DC | PRN
  Administered 2017-05-14 – 2017-05-19 (×8): 100 mg via ORAL
  Filled 2017-05-14 (×10): qty 5

## 2017-05-14 MED ORDER — SODIUM CHLORIDE 1 G PO TABS
1.0000 g | ORAL_TABLET | Freq: Two times a day (BID) | ORAL | Status: DC
  Administered 2017-05-14 – 2017-05-20 (×12): 1 g via ORAL
  Filled 2017-05-14 (×14): qty 1

## 2017-05-14 MED ORDER — METHYLPREDNISOLONE SODIUM SUCC 40 MG IJ SOLR
40.0000 mg | Freq: Two times a day (BID) | INTRAMUSCULAR | Status: DC
  Administered 2017-05-14 – 2017-05-15 (×4): 40 mg via INTRAVENOUS
  Filled 2017-05-14 (×5): qty 1

## 2017-05-14 MED ORDER — AZITHROMYCIN 500 MG IV SOLR: 500.0000 mg | INTRAVENOUS | Status: DC

## 2017-05-14 MED ORDER — ALBUTEROL SULFATE (2.5 MG/3ML) 0.083% IN NEBU
2.5000 mg | INHALATION_SOLUTION | RESPIRATORY_TRACT | Status: DC
  Administered 2017-05-14: 2.5 mg via RESPIRATORY_TRACT

## 2017-05-14 MED ORDER — MIRTAZAPINE 15 MG PO TABS
7.5000 mg | ORAL_TABLET | Freq: Every day | ORAL | Status: DC
  Administered 2017-05-14 – 2017-05-19 (×6): 7.5 mg via ORAL
  Filled 2017-05-14 (×7): qty 1

## 2017-05-14 MED ORDER — BUDESONIDE 0.25 MG/2ML IN SUSP
0.2500 mg | Freq: Two times a day (BID) | RESPIRATORY_TRACT | Status: DC
  Administered 2017-05-14 – 2017-05-16 (×4): 0.25 mg via RESPIRATORY_TRACT
  Filled 2017-05-14 (×7): qty 2

## 2017-05-14 MED ORDER — SERTRALINE HCL 100 MG PO TABS
100.0000 mg | ORAL_TABLET | Freq: Every day | ORAL | Status: DC
  Administered 2017-05-14 – 2017-05-20 (×7): 100 mg via ORAL
  Filled 2017-05-14 (×7): qty 1

## 2017-05-14 MED ORDER — METOPROLOL TARTRATE 25 MG PO TABS
25.0000 mg | ORAL_TABLET | Freq: Every day | ORAL | Status: DC
  Administered 2017-05-14 – 2017-05-20 (×7): 25 mg via ORAL
  Filled 2017-05-14 (×7): qty 1

## 2017-05-14 NOTE — Progress Notes (Signed)
ANTICOAGULATION CONSULT NOTE - Initial Consult  Pharmacy Consult for Heparin Indication: Pulmonary embolism  Allergies  Allergen Reactions  . Sulfa Antibiotics   . Contrast Media [Iodinated Diagnostic Agents] Itching and Rash    Patient Measurements: Height: 5\' 5"  (165.1 cm) Weight: 112 lb 7 oz (51 kg) IBW/kg (Calculated) : 57 Heparin Dosing Weight:51  Vital Signs: Temp: 98.2 F (36.8 C) (05/07 1339) Temp Source: Oral (05/07 1339) BP: 118/99 (05/07 1339) Pulse Rate: 88 (05/07 1339)  Labs: Recent Labs    05/13/17 1835 05/14/17 0507 05/14/17 1204  HGB 10.8* 10.0*  --   HCT 35.5* 32.4*  --   PLT 255 231  --   CREATININE 1.60* 1.59*  --   TROPONINI  --  <0.03 0.03*    Estimated Creatinine Clearance: 22.7 mL/min (A) (by C-G formula based on SCr of 1.59 mg/dL (H)).   Medical History: Past Medical History:  Diagnosis Date  . Allergy   . Anxiety   . Arthritis   . CAD (coronary artery disease)   . Cataract   . COPD (chronic obstructive pulmonary disease) ([place])   . Dementia   . Depression   . Myocardial infarction ([place])   . Stage IV squamous cell carcinoma of left lung ([place]) 09/25/2016    Assessment: 83 yo female with suspected pulmonary embolism per VQ scan. Pharmacy asked to dose heparin. Patient received enoxaparin 30mg  this AM ~1000   Goal of Therapy:  Heparin level 0.3-0.7 units/ml Monitor platelets by anticoagulation protocol: Yes   Plan:  D/c enoxaparin Heparin 3000 units bolus x 1 Start heparin drip at 900 units/hr HL 8 hours after start Daily Heparin level and CBC  Batina Dougan A. Levada Dy, PharmD, [place] Pager: [phone]  05/14/2017,7:28 PM

## 2017-05-14 NOTE — Progress Notes (Signed)
CRITICAL VALUE ALERT  Critical Value:  Troponin 0.03  Date & Time Notied:  05/14/2017 1400  Provider Notified: Dr. Algis Liming  Orders Received/Actions taken: no new orders received.

## 2017-05-14 NOTE — Progress Notes (Signed)
Addendum  VQ scan results appreciated and are as below.  IMPRESSION: Perfusion-only imaging which is complicated by chronic lung disease, but peripheral wedge-shaped perfusion defects in the left lung are suspicious for acute pulmonary embolus.  Assessment and plan  Possible acute pulmonary embolism: IV heparin per pharmacy.  History of GI bleed is noted in chart but patient's son cannot recollect same and does not think that she has had any recent bleeds.  Recommend discussing with her primary oncologist regarding choice of long-term anticoagulation and may need further chart review to determine when and how significant of GI bleed she had.  I discussed in detail with patient's son.  Updated care and answered questions.  Vernell Leep, MD, FACP, [facility]. [facility] Pager [phone]  If 7PM-7AM, please contact night-coverage [email or web address] Password [facility] 05/14/2017, 7:28 PM

## 2017-05-14 NOTE — Progress Notes (Signed)
PROGRESS NOTE   Kristin Peterson  EAV:[id number]    DOB: [birth year]    DOA: 05/13/2017  PCP: [facility]   I have briefly reviewed patients previous medical records in [facility].  Brief Narrative:  83 year old female, resident of [facility], COPD, chronic respiratory failure on home oxygen 2 L/min at bedtime, stage IV squamous cell lung cancer (Dr.Mohamed Mohamed, oncology), dementia, CAD status post CABG, A. fib not on anticoagulation, chronic diastolic CHF, chronic kidney disease, chronic anemia presented to ED with progressive dyspnea of 2 days duration, dry cough and history of exposure to sick contact with similar symptoms at ALF.  In the ED noted to be hypoxic.  CT chest without contrast findings suggestive of right lower lobe pneumonia.  Admitted for further evaluation and management.   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  CT chest without contrast: New patchy nodular airspace opacities right lower lobe consistent with acute pneumonia.  Flu panel PCR negative.  Blood cultures pending.  Patient had been empirically started on IV ceftriaxone and azithromycin but since patient on Tikosyn, avoiding QT prolonging medications and hence azithromycin was changed to doxycycline PO.  Supportive treatment.  COPD exacerbation: Continue oxygen, bronchodilator nebulizations, Pulmicort, Solu-Medrol 40 mg IV every 12 hours and doxycycline.  Added flutter valve.  Patient on prednisone 5 mg daily so not sure if steroid dependent or getting steroid for other  indication along with hydroxychloroquine.  Acute on chronic hypoxic respiratory failure: Likely related to pneumonia precipitating COPD exacerbation..  Treatment as above.  D-dimer positive.  Given history of lung cancer, PE is on differentials.  Unable to get CTA chest due to renal insufficiency.  Check VQ scan.  Titrate oxygen to saturations between 89-90%.  Chronic diastolic CHF: TTE March 7829: LVEF 55-60%.  Continue Lasix 40 mg orally daily.  Compensated.  Paroxysmal A. fib: Not on anticoagulation due to history of GI bleed.  Continue aspirin 81 mg daily, Tikosyn and metoprolol.  Hypothyroid: Continue Synthroid.  Stage III chronic kidney disease: Creatinine stable in the 1.5-1.6 range.  Follow BMP periodically.   Normocytic anemia: Possibly related to chronic disease.  Stable.  Hyperlipidemia: Statins.  Anxiety and depression: Continue clonazepam and sertraline.  Dementia: Mental status probably at baseline.  Continue Namenda.  Stage IV squamous cell lung cancer: Outpatient follow-up with Dr. Julien Nordmann.  CAD status post CABG: No chest pain reported.  Minimal troponin elevation likely related to acute respiratory failure.  Continue aspirin, statins and beta-blockers.  Hydroxychloroquine: Not clear regarding indication.  Also on prednisone 5 mg daily, not sure if for COPD or other indication along with hydroxychloroquine.   DVT prophylaxis: Lovenox Code Status: Full Family Communication: None at bedside. Disposition: To be determined pending clinical improvement and PT evaluation   Consultants:  None  Procedures:  None  Antimicrobials:  IV ceftriaxone and oral doxycycline   Subjective: Ongoing dry cough.  No chest pain.  Denies fever or chills.  Mild dyspnea and reports not significantly better than on admission.  Denies chest tightness or wheezing.  Denies recent long distance travel.  ROS: As above.  Objective:  Vitals:   05/14/17 0324 05/14/17 0511 05/14/17  0511 05/14/17 1339  BP:  (!) 110/95 (!) 110/95 (!) 118/99  Pulse:  89 89 88  Resp:  20 20 20   Temp:  98.4 F (36.9 C) 98.4 F (36.9 C) 98.2 F (36.8 C)  TempSrc:  Oral  Oral  SpO2:  96% 96% 98%  Weight: 51 kg (112 lb 7 oz)     Height: 5\' 5"  (1.651 m)       Examination:  General exam: Pleasant elderly female, small built in thinly nourished, lying propped up in bed with minimal increased work of breathing at times. Respiratory system: Reduced breath sounds bilaterally with scattered few medium pitched expiratory rhonchi.  Few basal crackles.  No stridor.  Minimal increased work of breathing at times.  Cardiovascular system: S1 & S2 heard, RRR. No JVD, murmurs, rubs, gallops or clicks. No pedal edema.  Not on telemetry-ordered. Gastrointestinal system: Abdomen is nondistended, soft and nontender. No organomegaly or masses felt. Normal bowel sounds heard. Central nervous system: Alert and oriented x2. No focal neurological deficits. Extremities: Symmetric 5 x 5 power. Skin: No rashes, lesions or ulcers Psychiatry: Judgement and insight impaired. Mood & affect appropriate.     Data Reviewed: I have personally reviewed following labs and imaging studies  CBC: Recent Labs  Lab 05/13/17 1835 05/14/17 0507  WBC 8.4 12.4*  NEUTROABS 5.4  --   HGB 10.8* 10.0*  HCT 35.5* 32.4*  MCV 100.9* 99.1  PLT 255 756   Basic Metabolic Panel: Recent Labs  Lab 05/13/17 1835 05/14/17 0507  NA 140 141  K 4.1 4.0  CL 101 103  CO2 29 26  GLUCOSE 122* 137*  BUN 25* 23*  CREATININE 1.60* 1.59*  CALCIUM 8.9 8.9    Cardiac Enzymes: Recent Labs  Lab 05/14/17 0507 05/14/17 1204  TROPONINI <0.03 0.03*     Recent Results (from the past 240 hour(s))  Blood culture (routine x 2)     Status: None (Preliminary result)   Collection Time: 05/13/17  6:34 PM  Result Value Ref Range Status   Specimen Description BLOOD RIGHT ANTECUBITAL  Final   Special Requests   Final    BOTTLES DRAWN  AEROBIC AND ANAEROBIC Blood Culture results may not be optimal due to an excessive volume of blood received in culture bottles   Culture   Final    NO GROWTH < 24 HOURS Performed at [facility] [address], [place], [place] [address]    Report Status PENDING  Incomplete  Blood culture (routine x 2)     Status: None (Preliminary result)   Collection Time: 05/13/17  8:10 PM  Result Value Ref Range Status   Specimen Description BLOOD LEFT ANTECUBITAL  Final   Special Requests   Final    BOTTLES DRAWN AEROBIC AND ANAEROBIC Blood Culture results may not be optimal due to an excessive volume of blood received in culture bottles   Culture   Final    NO GROWTH < 24 HOURS Performed at [facility], [place] [address], [place], [place] [address]    Report Status PENDING  Incomplete         Radiology Studies: Dg Chest 2 View  Result Date: 05/13/2017 CLINICAL DATA:  Shortness of breath.  COPD. EXAM: CHEST - 2 VIEW COMPARISON:  CT chest 03/25/2017. FINDINGS: The heart size and mediastinal contours are within normal limits. Both lungs are free of  consolidation or edema. The visualized skeletal structures are unremarkable. Old rib fractures. Prior CABG. Thoracic atherosclerosis. IMPRESSION: COPD.  No active disease. Electronically Signed   By: Staci Righter M.D.   On: 05/13/2017 19:05   Ct Chest Wo Contrast  Result Date: 05/13/2017 CLINICAL DATA:  Shortness of breath. COPD. Stroke [place] cell carcinoma of the LEFT lung. EXAM: CT CHEST WITHOUT CONTRAST TECHNIQUE: Multidetector CT imaging of the chest was performed following the standard protocol without IV contrast. COMPARISON:  CT chest 03/25/2017.  Chest radiograph earlier today. FINDINGS: Cardiovascular: Heart size is within normal limits. No significant pericardial fluid or thickening. Three-vessel coronary atherosclerosis. Previous CABG. Atherosclerotic nonaneurysmal thoracic aorta. Pulmonary artery caliber is within normal limits.  LEFT internal jugular Port-A-Cath terminates at the cavoatrial junction. Mediastinum/Nodes: No discrete thyroid nodules. Unremarkable esophagus. No pathologically enlarged nodes, within limits for detection on noncontrast exam. Lungs/Pleura: No pneumothorax or pleural effusion. Severe centrilobular emphysema. New patchy nodular peribronchial consolidation in the lateral basal segment RIGHT lower lobe. Previous posterior basal infiltrate is improved. Stable mildly thickened apical LEFT upper lobe parenchymal band. Stable bullous lesion medial LEFT upper lobe. Upper Abdomen: No acute findings. Musculoskeletal: Old rib fractures.  Thoracic spondylosis. IMPRESSION: New patchy nodular airspace opacities RIGHT lower lobe lateral basal segment consistent with acute pneumonia. This is difficult to appreciate on the earlier chest radiograph but represents a change from previous CT in March. There is resolution of the previous RIGHT lower lobe posterior basal segment infiltrate. Aortic Atherosclerosis (ICD10-I70.0) and Emphysema (ICD10-J43.9). Electronically Signed   By: Staci Righter M.D.   On: 05/13/2017 22:09        Scheduled Meds: . aspirin EC  81 mg Oral Daily  . atorvastatin  40 mg Oral Daily  . budesonide (PULMICORT) nebulizer solution  0.25 mg Nebulization BID  . clonazepam  0.75 mg Oral QHS  . dofetilide  125 mcg Oral BID  . enoxaparin (LOVENOX) injection  30 mg Subcutaneous Q24H  . furosemide  40 mg Oral Daily  . gabapentin  100 mg Oral TID  . hydroxychloroquine  200 mg Oral Daily  . levothyroxine  88 mcg Oral QAC breakfast  . loratadine  10 mg Oral Daily  . memantine  10 mg Oral BID  . methylPREDNISolone (SOLU-MEDROL) injection  40 mg Intravenous Q12H  . metoprolol tartrate  25 mg Oral Daily  . mirtazapine  7.5 mg Oral QHS  . pantoprazole  40 mg Oral Daily  . potassium chloride SA  20 mEq Oral Daily  . rivastigmine  4.6 mg Transdermal Daily  . sertraline  100 mg Oral Daily  . sodium  chloride  1 g Oral BID WC  . traMADol  50 mg Oral BID   Continuous Infusions: . doxycycline (VIBRAMYCIN) IV Stopped (05/14/17 1414)     LOS: 0 days     Vernell Leep, MD, FACP, [facility]. [facility] Pager [phone]  If 7PM-7AM, please contact night-coverage [email or web address] Password [facility] 05/14/2017, 3:47 PM

## 2017-05-14 NOTE — H&P (Signed)
History and Physical    Kristin Peterson SHF:[id number] DOB: 1934-03-15 DOA: 05/13/2017  [facility]P: [facility]  [facility] from: [facility].  Chief Complaint: Shortness of breath.  HPI: Kristin Peterson is a 83 y.o. female with history of COPD, stage IV squamous cell lung cancer being followed by Dr. Julien Nordmann, dementia, CAD status post CABG, atrial fibrillation not on anticoagulation, CHF, chronic kidney disease, chronic anemia was brought to the ER for the patient was complaining of increasing shortness of breath over the last 2 days.  Patient also has been having cough but unable to bring out sputum.  Patient states her friends in the assisted living facility had cough yesterday and she started having similar symptoms.  Denies any chest pain.  Denies any subjective feeling of fever or chills.  In the ER patient was found to be wheezing.  Hypoxic.  ED Course: CT chest without contrast shows pneumonic process and was placed on antibiotics.  Review of Systems: As per HPI, rest all negative.   Past Medical History:  Diagnosis Date  . Allergy   . Anxiety   . Arthritis   . CAD (coronary artery disease)   . Cataract   . COPD (chronic obstructive pulmonary disease) ([place])   . Dementia   . Depression   . Myocardial infarction ([place])   . Stage IV squamous cell carcinoma of left lung ([place]) 09/25/2016    Past Surgical History:  Procedure Laterality Date  . ABDOMINAL HYSTERECTOMY    . CORONARY ARTERY BYPASS GRAFT    . OOPHORECTOMY    . TONSILLECTOMY       reports that she quit smoking about 21 years ago. Her smoking use included cigarettes. She has a 40.00 pack-year smoking history. She has never used smokeless tobacco. She reports that she does not drink alcohol or use drugs.  Allergies  Allergen Reactions  . Sulfa Antibiotics   . Contrast Media [Iodinated Diagnostic Agents] Itching and Rash    Family History  Problem Relation Age of Onset  . Parkinsonism Father      Prior to Admission medications   Medication Sig Start Date End Date Taking? Authorizing Provider  acetaminophen (TYLENOL) 650 MG CR tablet Take 650 mg by mouth every 6 (six) hours.    Yes [provider]  aspirin EC 81 MG tablet Take 1 tablet (81 mg total) by mouth daily. 10/04/16  Yes Lelon Perla, MD  atorvastatin (LIPITOR) 40 MG tablet Take 1 tablet (40 mg total) by mouth daily. 10/04/16 10/04/17 Yes Lelon Perla, MD  budesonide-formoterol [facility]) 160-4.5 MCG/ACT inhaler Inhale 2 puffs into the lungs 2 (two) times daily.   Yes [provider]  clonazePAM (KLONOPIN) 0.5 MG tablet Take 0.75 mg by mouth at bedtime.    Yes [provider]  clonazePAM (KLONOPIN) 0.5 MG tablet Take 0.75 mg by mouth daily as needed for anxiety.   Yes [provider]  Coenzyme Q-10 200 MG CAPS Take 200 mg by mouth 2 (two) times daily.    Yes [provider]  dofetilide (TIKOSYN) 125 MCG capsule Take 125 mcg by mouth 2 (two) times daily.   Yes [provider]  FeFum-FePo-FA-B Cmp-C-Zn-Mn-Cu (TANDEM PLUS PO) Take 1 tablet by mouth daily.   Yes [provider]  furosemide (LASIX) 40 MG tablet Take 40 mg by mouth.   Yes [provider]  gabapentin (NEURONTIN) 100 MG capsule Take 100 mg by mouth 3 (three) times daily.   Yes  [provider]  guaifenesin (ROBITUSSIN) 100 MG/5ML syrup Take 100 mg by mouth 4 (four) times daily as needed for cough.   Yes [provider]  hydroxychloroquine (PLAQUENIL) 200 MG tablet Take by mouth daily.   Yes [provider]  levothyroxine (SYNTHROID, LEVOTHROID) 88 MCG tablet Take 88 mcg by mouth daily before breakfast.   Yes [provider]  loperamide (IMODIUM A-D) 2 MG tablet Take 2 mg by mouth 4 (four) times daily as needed for diarrhea or loose stools.   Yes [provider]  loratadine (CLARITIN) 10 MG tablet Take 10 mg by mouth daily.   Yes [provider]  memantine (NAMENDA) 10 MG tablet Take 10 mg by mouth 2 (two) times daily.   Yes [provider]  metoprolol tartrate (LOPRESSOR) 25 MG tablet Take 25 mg by mouth daily.    Yes [provider]  mirtazapine (REMERON) 15 MG tablet Take 7.5 mg by mouth at bedtime.    Yes [provider]  pantoprazole (PROTONIX) 40 MG tablet Take 40 mg by mouth daily.   Yes [provider]  polyethylene glycol (MIRALAX / GLYCOLAX) packet Take 17 g by mouth daily as needed for mild constipation.   Yes [provider]  potassium chloride SA (K-DUR,KLOR-CON) 20 MEQ tablet Take 20 mEq by mouth daily.    Yes [provider]  predniSONE (DELTASONE) 5 MG tablet Take 5 mg by mouth daily with breakfast.   Yes [provider]  rivastigmine (EXELON) 4.6 mg/24hr Place 4.6 mg onto the skin daily.   Yes [provider]  sertraline (ZOLOFT) 100 MG tablet Take 100 mg by mouth daily.   Yes [provider]  sodium chloride 1 g tablet Take 1 g by mouth 2 (two) times daily with a meal.   Yes [provider]  tiotropium (SPIRIVA) 18 MCG inhalation capsule Place 18 mcg into inhaler and inhale daily.   Yes [provider]  traMADol (ULTRAM) 50 MG tablet Take 50 mg by mouth 2 (two) times daily.    Yes [provider]    Physical Exam: Vitals:   05/13/17 2200 05/13/17 2315 05/13/17 2345 05/14/17 0000  BP: (!) 104/46 119/64 (!) 111/47 (!) 116/57  Pulse: 78 82 80 79  Resp: 13 20 14  (!) 24  Temp:      TempSrc:      SpO2: 94% 100% 97% 98%  Weight:      Height:          Constitutional: Moderately built and nourished. Vitals:   05/13/17 2200 05/13/17 2315 05/13/17 2345 05/14/17 0000  BP: (!) 104/46 119/64 (!) 111/47 (!) 116/57  Pulse: 78 82 80 79  Resp: 13 20 14  (!) 24  Temp:      TempSrc:      SpO2: 94% 100% 97% 98%  Weight:      Height:       Eyes: Anicteric no pallor. ENMT: No discharge from the ears  eyes nose or mouth. Neck: No mass felt.  No JVD appreciated. Respiratory: Bilateral tight air entry present.  No crepitations. Cardiovascular: S1-S2 heard no murmurs appreciated. Abdomen: Soft nontender bowel sounds present. Musculoskeletal: No edema. Skin: No rash. Neurologic: Alert awake oriented to time place and person.  Moves all extremities. Psychiatric: Oriented to name place and person.   Labs on Admission: I have personally reviewed following labs and imaging studies  CBC: Recent Labs  Lab 05/13/17 1835  WBC 8.4  NEUTROABS  5.4  HGB 10.8*  HCT 35.5*  MCV 100.9*  PLT 268   Basic Metabolic Panel: Recent Labs  Lab 05/13/17 1835  NA 140  K 4.1  CL 101  CO2 29  GLUCOSE 122*  BUN 25*  CREATININE 1.60*  CALCIUM 8.9   GFR: Estimated Creatinine Clearance: 22.1 mL/min (A) (by C-G formula based on SCr of 1.6 mg/dL (H)). Liver Function Tests: No results for input(s): AST, ALT, ALKPHOS, BILITOT, PROT, ALBUMIN in the last 168 hours. No results for input(s): LIPASE, AMYLASE in the last 168 hours. No results for input(s): AMMONIA in the last 168 hours. Coagulation Profile: No results for input(s): INR, PROTIME in the last 168 hours. Cardiac Enzymes: No results for input(s): CKTOTAL, CKMB, CKMBINDEX, TROPONINI in the last 168 hours. BNP (last 3 results) No results for input(s): PROBNP in the last 8760 hours. HbA1C: No results for input(s): HGBA1C in the last 72 hours. CBG: No results for input(s): GLUCAP in the last 168 hours. Lipid Profile: No results for input(s): CHOL, HDL, LDLCALC, TRIG, CHOLHDL, LDLDIRECT in the last 72 hours. Thyroid Function Tests: No results for input(s): TSH, T4TOTAL, FREET4, T3FREE, THYROIDAB in the last 72 hours. Anemia Panel: No results for input(s): VITAMINB12, FOLATE, FERRITIN, TIBC, IRON, RETICCTPCT in the last 72 hours. Urine analysis: No results found for: COLORURINE, APPEARANCEUR, LABSPEC, PHURINE, GLUCOSEU, HGBUR, BILIRUBINUR,  KETONESUR, PROTEINUR, UROBILINOGEN, NITRITE, LEUKOCYTESUR Sepsis Labs: @LABRCNTIP (procalcitonin:4,lacticidven:4) )No results found for this or any previous visit (from the past 240 hour(s)).   Radiological Exams on Admission: Dg Chest 2 View  Result Date: 05/13/2017 CLINICAL DATA:  Shortness of breath.  COPD. EXAM: CHEST - 2 VIEW COMPARISON:  CT chest 03/25/2017. FINDINGS: The heart size and mediastinal contours are within normal limits. Both lungs are free of consolidation or edema. The visualized skeletal structures are unremarkable. Old rib fractures. Prior CABG. Thoracic atherosclerosis. IMPRESSION: COPD.  No active disease. Electronically Signed   By: Staci Righter M.D.   On: 05/13/2017 19:05   Ct Chest Wo Contrast  Result Date: 05/13/2017 CLINICAL DATA:  Shortness of breath. COPD. Stroke [place] cell carcinoma of the LEFT lung. EXAM: CT CHEST WITHOUT CONTRAST TECHNIQUE: Multidetector CT imaging of the chest was performed following the standard protocol without IV contrast. COMPARISON:  CT chest 03/25/2017.  Chest radiograph earlier today. FINDINGS: Cardiovascular: Heart size is within normal limits. No significant pericardial fluid or thickening. Three-vessel coronary atherosclerosis. Previous CABG. Atherosclerotic nonaneurysmal thoracic aorta. Pulmonary artery caliber is within normal limits. LEFT internal jugular Port-A-Cath terminates at the cavoatrial junction. Mediastinum/Nodes: No discrete thyroid nodules. Unremarkable esophagus. No pathologically enlarged nodes, within limits for detection on noncontrast exam. Lungs/Pleura: No pneumothorax or pleural effusion. Severe centrilobular emphysema. New patchy nodular peribronchial consolidation in the lateral basal segment RIGHT lower lobe. Previous posterior basal infiltrate is improved. Stable mildly thickened apical LEFT upper lobe parenchymal band. Stable bullous lesion medial LEFT upper lobe. Upper Abdomen: No acute findings. Musculoskeletal:  Old rib fractures.  Thoracic spondylosis. IMPRESSION: New patchy nodular airspace opacities RIGHT lower lobe lateral basal segment consistent with acute pneumonia. This is difficult to appreciate on the earlier chest radiograph but represents a change from previous CT in March. There is resolution of the previous RIGHT lower lobe posterior basal segment infiltrate. Aortic Atherosclerosis (ICD10-I70.0) and Emphysema (ICD10-J43.9). Electronically Signed   By: Staci Righter M.D.   On: 05/13/2017 22:09    EKG: Independently reviewed.  Normal sinus rhythm with LBBB.  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 Acute respiratory failure with hypoxia likely a combination of COPD and pneumonia. -Patient placed on empiric antibiotics.  Continue nebulizer steroids and Pulmicort.  Follow cultures.  Will check MRSA.  Patient is from assisted living facility.  Follow sputum cultures if available.  Check influenza PCR.  Check for urine for Legionella and strep antigen.  Check d-dimer. 2. History of CHF last EF measured in March 2018 was 55 to 60% as per cardiology notes.  On Lasix.  Does not appear to be fluid overloaded. 3. History of CAD status post CABG -denies any chest pain. 4. History of atrial fibrillation presently in sinus rhythm.  Has not been on anticoagulation with a history of GI bleed.  Patient medication list states that patient is on Tikosyn and metoprolol.  This will need to be reconfirmed with patient's family are PCP in the morning. 5. On hydroxychloroquine reason not clear. 6. Hypothyroidism on Synthroid. 7. Chronic kidney disease stage III creatinine  appears to be at baseline. 8. Normocytic normochromic anemia -hemoglobin appears to be at baseline. 9. History of dementia -continue present medications. 10. Hyperlipidemia on statins. 11. Stage IV squamous cell carcinoma being followed by Dr. Julien Nordmann.  Since patient has new infiltrates and history of lung cancer may need to get or arrange follow-up with oncologist.   DVT prophylaxis: Lovenox. Code Status: Full code. Family Communication: No family at the bedside. Disposition Plan: Back to facility. Consults called: None. Admission status: Inpatient.   Rise Patience MD [facility] Pager [phone].  If 7PM-7AM, please contact night-coverage [email or web address] Password [email or web address]  05/14/2017, 12:12 AM

## 2017-05-15 DIAGNOSIS — I480 Paroxysmal atrial fibrillation: Secondary | ICD-10-CM

## 2017-05-15 DIAGNOSIS — J441 Chronic obstructive pulmonary disease with (acute) exacerbation: Secondary | ICD-10-CM

## 2017-05-15 DIAGNOSIS — C3492 Malignant neoplasm of unspecified part of left bronchus or lung: Secondary | ICD-10-CM

## 2017-05-15 DIAGNOSIS — N183 Chronic kidney disease, stage 3 (moderate): Secondary | ICD-10-CM

## 2017-05-15 LAB — CBC
HCT: 33 % — ABNORMAL LOW (ref 36.0–46.0)
HEMOGLOBIN: 10.1 g/dL — AB (ref 12.0–15.0)
MCH: 30.1 pg (ref 26.0–34.0)
MCHC: 30.6 g/dL (ref 30.0–36.0)
MCV: 98.5 fL (ref 78.0–100.0)
Platelets: 215 10*3/uL (ref 150–400)
RBC: 3.35 MIL/uL — ABNORMAL LOW (ref 3.87–5.11)
RDW: 14.7 % (ref 11.5–15.5)
WBC: 14.7 10*3/uL — AB (ref 4.0–10.5)

## 2017-05-15 LAB — APTT: APTT: 91 s — AB (ref 24–36)

## 2017-05-15 LAB — BASIC METABOLIC PANEL
ANION GAP: 12 (ref 5–15)
BUN: 19 mg/dL (ref 6–20)
CHLORIDE: 99 mmol/L — AB (ref 101–111)
CO2: 27 mmol/L (ref 22–32)
Calcium: 8.9 mg/dL (ref 8.9–10.3)
Creatinine, Ser: 1.26 mg/dL — ABNORMAL HIGH (ref 0.44–1.00)
GFR calc non Af Amer: 39 mL/min — ABNORMAL LOW (ref >60)
GFR, EST AFRICAN AMERICAN: 45 mL/min — AB (ref >60)
Glucose, Bld: 106 mg/dL — ABNORMAL HIGH (ref 65–99)
POTASSIUM: 4.1 mmol/L (ref 3.5–5.1)
Sodium: 138 mmol/L (ref 135–145)

## 2017-05-15 LAB — HEPARIN LEVEL (UNFRACTIONATED)
Heparin Unfractionated: 0.76 IU/mL — ABNORMAL HIGH (ref 0.30–0.70)
Heparin Unfractionated: 0.7 IU/mL (ref 0.30–0.70)

## 2017-05-15 MED ORDER — ENOXAPARIN SODIUM 60 MG/0.6ML ~~LOC~~ SOLN
1.0000 mg/kg | SUBCUTANEOUS | Status: DC
  Administered 2017-05-15 – 2017-05-19 (×5): 50 mg via SUBCUTANEOUS
  Filled 2017-05-15 (×5): qty 0.6

## 2017-05-15 NOTE — Progress Notes (Addendum)
ANTICOAGULATION CONSULT NOTE   Pharmacy Consult for Heparin > Lovenox Indication: Pulmonary embolism  Allergies  Allergen Reactions  . Sulfa Antibiotics   . Contrast Media [Iodinated Diagnostic Agents] Itching and Rash    Patient Measurements: Height: 5\' 5"  (165.1 cm) Weight: 112 lb 7 oz (51 kg) IBW/kg (Calculated) : 57 Heparin Dosing Weight:51  Vital Signs: Temp: 97.6 F (36.4 C) (05/08 0620) Temp Source: Oral (05/08 0620) BP: 143/63 (05/08 0620) Pulse Rate: 80 (05/08 0620)  Labs: Recent Labs    05/13/17 1835 05/14/17 0507 05/14/17 1204 05/15/17 0410 05/15/17 1156  HGB 10.8* 10.0*  --  10.1*  --   HCT 35.5* 32.4*  --  33.0*  --   PLT 255 231  --  215  --   APTT  --   --   --   --  91*  HEPARINUNFRC  --   --   --  0.76* 0.70  CREATININE 1.60* 1.59*  --  1.26*  --   TROPONINI  --  <0.03 0.03*  --   --     Estimated Creatinine Clearance: 28.7 mL/min (A) (by C-G formula based on SCr of 1.26 mg/dL (H)).   Medical History: Past Medical History:  Diagnosis Date  . Allergy   . Anxiety   . Arthritis   . CAD (coronary artery disease)   . Cataract   . COPD (chronic obstructive pulmonary disease) ([place])   . Dementia   . Depression   . Myocardial infarction ([place])   . Stage IV squamous cell carcinoma of left lung ([place]) 09/25/2016    Assessment: 83 yo female with suspected pulmonary embolism per VQ scan. Pharmacy asked to dose heparin. Hx of GIB noted but MD does not believe any recent bleeds. Heparin level now therapeutic after rate decrease at top of range at 0.7, aPTT 91 (given Lovenox 30mg  5/7 AM). CBC stable, no overt bleeding per RN. Renal function improving.  Goal of Therapy:  Heparin level 0.3-0.7 units/ml Monitor platelets by anticoagulation protocol: Yes   Plan:  Reduce heparin drip slightly to 800 units/hr 6h heparin level to confirm Monitor daily heparin level and CBC, s/sx bleeding F/u long-term plans for anticoagulation  Elicia Lamp, PharmD,  BCPS Clinical Pharmacist 05/15/2017 1:18 PM   ADDENDUM:  Pharmacy consulted to transition to Lovenox for PE as recommended by Oncology to [facility]. Onc plans to treat at least 6 months and follow closely for GIB. Scr improved to 1.2 (~ at baseline), CrCl<30, wt 51 kg. No bleed issues reported per discussion with RN.  Plan: D/c heparin > start Lovenox 1hr after heparin drip d/c (communicated plan with RN) Lovenox 50mg  (~1mg /kg) [place] q24h with CrCl<30 Monitor CBC at least q72h on Lovenox, SCr trend, closely for s/sx bleeding  Elicia Lamp, PharmD, BCPS Clinical Pharmacist Clinical phone for 05/15/2017 until 3:30pm: [phone] If after 3:30pm, please call main pharmacy at: [phone] 05/15/2017 2:18 PM

## 2017-05-15 NOTE — Progress Notes (Signed)
[place] for Heparin Indication: Pulmonary embolism  Allergies  Allergen Reactions  . Sulfa Antibiotics   . Contrast Media [Iodinated Diagnostic Agents] Itching and Rash    Patient Measurements: Height: 5\' 5"  (165.1 cm) Weight: 112 lb 7 oz (51 kg) IBW/kg (Calculated) : 57 Heparin Dosing Weight:51  Vital Signs: Temp: 99.1 F (37.3 C) (05/07 2201) BP: 113/73 (05/07 2201) Pulse Rate: 87 (05/07 2201)  Labs: Recent Labs    05/13/17 1835 05/14/17 0507 05/14/17 1204 05/15/17 0410  HGB 10.8* 10.0*  --  10.1*  HCT 35.5* 32.4*  --  33.0*  PLT 255 231  --  215  HEPARINUNFRC  --   --   --  0.76*  CREATININE 1.60* 1.59*  --   --   TROPONINI  --  <0.03 0.03*  --     Estimated Creatinine Clearance: 22.7 mL/min (A) (by C-G formula based on SCr of 1.59 mg/dL (H)).   Medical History: Past Medical History:  Diagnosis Date  . Allergy   . Anxiety   . Arthritis   . CAD (coronary artery disease)   . Cataract   . COPD (chronic obstructive pulmonary disease) ([place])   . Dementia   . Depression   . Myocardial infarction ([place])   . Stage IV squamous cell carcinoma of left lung ([place]) 09/25/2016    Assessment: 83 yo female with suspected pulmonary embolism per VQ scan. Pharmacy asked to dose heparin. Patient received enoxaparin 30mg  yesterday  AM ~1000. This could be affecting heparin level slightly given poor renal function. Will reduce rate slightly and order aPTT with next level to confirm therapeutic level. CBC stable, no overt bleeding per RN  Goal of Therapy:  Heparin level 0.3-0.7 units/ml Monitor platelets by anticoagulation protocol: Yes   Plan:  Reduce heparin drip to 850 units/hr Heparin level/aPTT @ 1200 Daily Heparin level and CBC  Georga Bora, PharmD Clinical Pharmacist 05/15/2017,5:21 AM

## 2017-05-15 NOTE — Progress Notes (Signed)
PT Cancellation Note  Patient Details Name: Kristin Peterson MRN: [id number] DOB: 07-08-1934   Cancelled Treatment:    Reason Eval/Treat Not Completed: Medical issues which prohibited therapy. Contacted nursing and was told to hold PT due to ongoing treatment for PE.  Will recheck tomorrow to see if pt is able to progress to PT eval.   Ramond Dial 05/15/2017, 1:34 PM   Mee Hives, PT MS Acute Rehab Dept. Number: [place] and [place]

## 2017-05-15 NOTE — Progress Notes (Signed)
[facility]                                                                              Patient Demographics  Kristin Peterson, is a 83 y.o. female, DOB - 12-14-1934, CHE:[id number]  Admit date - 05/13/2017   Admitting Physician Rise Patience, MD  Outpatient Primary MD for the patient is [facility]  Outpatient specialists:   LOS - 1  days   Medical records reviewed and are as summarized below:    Chief Complaint  Patient presents with  . Shortness of Breath       Brief summary   83 year old female, resident of [facility], COPD, chronic respiratory failure on home oxygen 2 L/min at bedtime, stage IV squamous cell lung cancer (Dr.Mohamed Mohamed, oncology), dementia, CAD status post CABG, A. fib not on anticoagulation, chronic diastolic CHF, chronic kidney disease, chronic anemia presented to ED with progressive dyspnea of 2 days duration, dry cough and history of exposure to sick contact with similar symptoms at ALF.  In the ED noted to be hypoxic.  CT chest without contrast findings suggestive of right lower lobe pneumonia.  Admitted for further evaluation and management.  Assessment & Plan    Principal Problem:   Acute respiratory failure with hypoxia (HCC) secondary to community-acquired pneumonia/ RLL -Chest x-ray showed no acute disease however CT chest without contrast showed new patchy nodular airspace opacities right lower lobe consistent with pneumonia. -Blood cultures negative so far, flu panel negative. -Patient was placed on Rocephin, Zithromax however patient on Tikosyn, azithromycin was changed to doxycycline. -VQ scan showed peripheral wedge-shaped perfusion defects in the left lung suspicious for acute PE -Discussed with Dr. Earlie Server in detail, patient is high risk candidate for pulmonary embolism due to malignancy.  Recommended, no need to repeat CT angiogram, place on 6 months of Lovenox, will follow outpatient.  If  patient has any GI bleeding, will need to DC Lovenox at that point.  Active Problems: COPD exacerbation -Continue O2, bronchodilators, Pulmicort, IV Solu-Medrol, doxycycline -Continue flutter valve    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) -Sees Dr. Julien Nordmann outpatient, discussed in detail, will follow.  Hypothyroidism -Continue Synthroid    PAF (paroxysmal atrial fibrillation) (HCC) -Continue metoprolol, Tikosyn -Currently on aspirin  Mild acute on CKD (chronic kidney disease) stage 3, GFR 30-59 ml/min (HCC) -Creatinine improving, baseline creatinine 1.3.  -  Creatinine on admission 1.6.  Improved to 1.2  CAD status post CABG -No chest pain, mild troponin elevation likely due to acute respiratory failure.  Currently on aspirin, statin, beta-blocker.  Stage IV squamous cell lung CA -Discussed with Dr. Julien Nordmann, follow outpatient  Dementia with anxiety -Continue Namenda  Code Status: Full code DVT Prophylaxis: IV heparin Family Communication: Discussed in detail with the patient, all imaging results, lab results explained to the patient  Disposition Plan:   Time Spent in minutes 35 minutes  Procedures:  VQ SCAN   Consultants:   Oncology- Dr Earlie Server   Antimicrobials:      Medications  Scheduled Meds: . aspirin EC  81 mg Oral Daily  . atorvastatin  40  mg Oral Daily  . budesonide (PULMICORT) nebulizer solution  0.25 mg Nebulization BID  . clonazepam  0.75 mg Oral QHS  . dofetilide  125 mcg Oral BID  . doxycycline  100 mg Oral Q12H  . furosemide  40 mg Oral Daily  . gabapentin  100 mg Oral TID  . hydroxychloroquine  200 mg Oral Daily  . levothyroxine  88 mcg Oral QAC breakfast  . loratadine  10 mg Oral Daily  . mouth rinse  15 mL Mouth Rinse BID  . memantine  10 mg Oral BID  . methylPREDNISolone (SOLU-MEDROL) injection  40 mg Intravenous Q12H  . metoprolol tartrate  25 mg Oral Daily  . mirtazapine  7.5 mg Oral QHS  . pantoprazole  40 mg Oral  Daily  . potassium chloride SA  20 mEq Oral Daily  . rivastigmine  4.6 mg Transdermal Daily  . sertraline  100 mg Oral Daily  . sodium chloride  1 g Oral BID WC  . traMADol  50 mg Oral BID   Continuous Infusions: . cefTRIAXone (ROCEPHIN)  IV Stopped (05/14/17 2333)  . heparin 850 Units/hr (05/15/17 0530)   PRN Meds:.acetaminophen **OR** acetaminophen, albuterol, clonazepam, guaifenesin, ondansetron **OR** ondansetron (ZOFRAN) IV, polyethylene glycol   Antibiotics   Anti-infectives (From admission, onward)   Start     Dose/Rate Route Frequency Ordered Stop   05/14/17 2200  cefTRIAXone (ROCEPHIN) 1 g in sodium chloride 0.9 % 100 mL IVPB     1 g 200 mL/hr over 30 Minutes Intravenous Every 24 hours 05/14/17 1600 05/21/17 2159   05/14/17 2200  doxycycline (VIBRA-TABS) tablet 100 mg     100 mg Oral Every 12 hours 05/14/17 1600 05/21/17 0959   05/14/17 2000  cefTRIAXone (ROCEPHIN) 1 g in sodium chloride 0.9 % 100 mL IVPB  Status:  Discontinued     1 g 200 mL/hr over 30 Minutes Intravenous Every 24 hours 05/14/17 0011 05/14/17 1058   05/14/17 2000  azithromycin (ZITHROMAX) 500 mg in sodium chloride 0.9 % 250 mL IVPB  Status:  Discontinued     500 mg 250 mL/hr over 60 Minutes Intravenous Every 24 hours 05/14/17 0011 05/14/17 1058   05/14/17 1200  doxycycline (VIBRAMYCIN) 100 mg in sodium chloride 0.9 % 250 mL IVPB  Status:  Discontinued     100 mg 125 mL/hr over 120 Minutes Intravenous 2 times daily 05/14/17 1059 05/14/17 1600   05/14/17 1000  hydroxychloroquine (PLAQUENIL) tablet 200 mg     200 mg Oral Daily 05/14/17 0011     05/13/17 2230  cefTRIAXone (ROCEPHIN) 1 g in sodium chloride 0.9 % 100 mL IVPB     1 g 200 mL/hr over 30 Minutes Intravenous  Once 05/13/17 2216 05/14/17 0003   05/13/17 2230  azithromycin (ZITHROMAX) 500 mg in sodium chloride 0.9 % 250 mL IVPB     500 mg 250 mL/hr over 60 Minutes Intravenous  Once 05/13/17 2216 05/14/17 0003        Subjective:   Maloni Musleh was seen and examined today.  Feels short of breath, coughing,.  No chest pain. Patient denies dizziness, abdominal pain, N/V/D/C, new weakness, numbess, tingling. No acute events overnight.    Objective:   Vitals:   05/14/17 2109 05/14/17 2201 05/15/17 0620 05/15/17 0924  BP:  113/73 (!) 143/63   Pulse:  87 80   Resp:  18 18   Temp:  99.1 F (37.3 C) 97.6 F (36.4 C)   TempSrc:   Oral  SpO2: 95% 93% 92% 94%  Weight:      Height:        Intake/Output Summary (Last 24 hours) at 05/15/2017 1343 Last data filed at 05/15/2017 0900 Gross per 24 hour  Intake 932.4 ml  Output 600 ml  Net 332.4 ml     Wt Readings from Last 3 Encounters:  05/14/17 51 kg (112 lb 7 oz)  03/27/17 49.9 kg (109 lb 14.4 oz)  03/22/17 50.3 kg (110 lb 12.8 oz)     Exam  General: Alert and oriented x 3, NAD  Eyes:   HEENT:    Cardiovascular: S1 S2 auscultated, Regular rate and rhythm.  Respiratory: Bilateral rhonchi  Gastrointestinal: Soft, nontender, nondistended, + bowel sounds  Ext: no pedal edema bilaterally  Neuro: no focal deficits  Musculoskeletal: No digital cyanosis, clubbing  Skin: No rashes  Psych: Normal affect and demeanor, alert and oriented x3    Data Reviewed:  I have personally reviewed following labs and imaging studies  Micro Results Recent Results (from the past 240 hour(s))  Blood culture (routine x 2)     Status: None (Preliminary result)   Collection Time: 05/13/17  6:34 PM  Result Value Ref Range Status   Specimen Description BLOOD RIGHT ANTECUBITAL  Final   Special Requests   Final    BOTTLES DRAWN AEROBIC AND ANAEROBIC Blood Culture results may not be optimal due to an excessive volume of blood received in culture bottles   Culture   Final    NO GROWTH 2 DAYS Performed at [facility], [place] [address] [place], [place] [address]    Report Status PENDING  Incomplete  Blood culture (routine x 2)     Status: None (Preliminary result)    Collection Time: 05/13/17  8:10 PM  Result Value Ref Range Status   Specimen Description BLOOD LEFT ANTECUBITAL  Final   Special Requests   Final    BOTTLES DRAWN AEROBIC AND ANAEROBIC Blood Culture results may not be optimal due to an excessive volume of blood received in culture bottles   Culture   Final    NO GROWTH 2 DAYS Performed at [facility], [place] [address] [place], [place] [address]    Report Status PENDING  Incomplete    Radiology Reports Dg Chest 2 View  Result Date: 05/13/2017 CLINICAL DATA:  Shortness of breath.  COPD. EXAM: CHEST - 2 VIEW COMPARISON:  CT chest 03/25/2017. FINDINGS: The heart size and mediastinal contours are within normal limits. Both lungs are free of consolidation or edema. The visualized skeletal structures are unremarkable. Old rib fractures. Prior CABG. Thoracic atherosclerosis. IMPRESSION: COPD.  No active disease. Electronically Signed   By: Staci Righter M.D.   On: 05/13/2017 19:05   Ct Chest Wo Contrast  Result Date: 05/13/2017 CLINICAL DATA:  Shortness of breath. COPD. Stroke [place] cell carcinoma of the LEFT lung. EXAM: CT CHEST WITHOUT CONTRAST TECHNIQUE: Multidetector CT imaging of the chest was performed following the standard protocol without IV contrast. COMPARISON:  CT chest 03/25/2017.  Chest radiograph earlier today. FINDINGS: Cardiovascular: Heart size is within normal limits. No significant pericardial fluid or thickening. Three-vessel coronary atherosclerosis. Previous CABG. Atherosclerotic nonaneurysmal thoracic aorta. Pulmonary artery caliber is within normal limits. LEFT internal jugular Port-A-Cath terminates at the cavoatrial junction. Mediastinum/Nodes: No discrete thyroid nodules. Unremarkable esophagus. No pathologically enlarged nodes, within limits for detection on noncontrast exam. Lungs/Pleura: No pneumothorax or pleural effusion. Severe centrilobular emphysema. New patchy nodular peribronchial consolidation  in the lateral  basal segment RIGHT lower lobe. Previous posterior basal infiltrate is improved. Stable mildly thickened apical LEFT upper lobe parenchymal band. Stable bullous lesion medial LEFT upper lobe. Upper Abdomen: No acute findings. Musculoskeletal: Old rib fractures.  Thoracic spondylosis. IMPRESSION: New patchy nodular airspace opacities RIGHT lower lobe lateral basal segment consistent with acute pneumonia. This is difficult to appreciate on the earlier chest radiograph but represents a change from previous CT in March. There is resolution of the previous RIGHT lower lobe posterior basal segment infiltrate. Aortic Atherosclerosis (ICD10-I70.0) and Emphysema (ICD10-J43.9). Electronically Signed   By: Staci Righter M.D.   On: 05/13/2017 22:09   Nm Pulmonary Perfusion  Result Date: 05/14/2017 CLINICAL DATA:  83 year old female with chronic respiratory disease on home oxygen. Stage IV lung cancer. Progressive shortness of breath for 2 days with nonproductive cough. EXAM: NUCLEAR MEDICINE PERFUSION SCAN TECHNIQUE: Perfusion images were obtained in multiple projections after intravenous injection of radiopharmaceutical. RADIOPHARMACEUTICALS:  4.0 mCi Tc64m MAA-IV only (no ventilation agent) COMPARISON:  Chest CT 05/13/2017. FINDINGS: Ventilation: Not performed. Perfusion: Large lung volumes. Gradient like decreased perfusion radiotracer activity in the upper lobes, more so the right. Focal increased radiotracer activity in the right lower lobe. Additionally, there are peripheral wedge-shaped areas of decreased perfusion activity in the left lateral and upper lung, best demonstrated on the posterior image. IMPRESSION: Perfusion-only imaging which is complicated by chronic lung disease, but peripheral wedge-shaped perfusion defects in the left lung are suspicious for acute pulmonary embolus. Electronically Signed   By: Genevie Ann M.D.   On: 05/14/2017 19:02    Lab Data:  CBC: Recent Labs  Lab 05/13/17 1835  05/14/17 0507 05/15/17 0410  WBC 8.4 12.4* 14.7*  NEUTROABS 5.4  --   --   HGB 10.8* 10.0* 10.1*  HCT 35.5* 32.4* 33.0*  MCV 100.9* 99.1 98.5  PLT 255 231 295   Basic Metabolic Panel: Recent Labs  Lab 05/13/17 1835 05/14/17 0507 05/15/17 0410  NA 140 141 138  K 4.1 4.0 4.1  CL 101 103 99*  CO2 29 26 27   GLUCOSE 122* 137* 106*  BUN 25* 23* 19  CREATININE 1.60* 1.59* 1.26*  CALCIUM 8.9 8.9 8.9   GFR: Estimated Creatinine Clearance: 28.7 mL/min (A) (by C-G formula based on SCr of 1.26 mg/dL (H)). Liver Function Tests: No results for input(s): AST, ALT, ALKPHOS, BILITOT, PROT, ALBUMIN in the last 168 hours. No results for input(s): LIPASE, AMYLASE in the last 168 hours. No results for input(s): AMMONIA in the last 168 hours. Coagulation Profile: No results for input(s): INR, PROTIME in the last 168 hours. Cardiac Enzymes: Recent Labs  Lab 05/14/17 0507 05/14/17 1204  TROPONINI <0.03 0.03*   BNP (last 3 results) No results for input(s): PROBNP in the last 8760 hours. HbA1C: No results for input(s): HGBA1C in the last 72 hours. CBG: No results for input(s): GLUCAP in the last 168 hours. Lipid Profile: No results for input(s): CHOL, HDL, LDLCALC, TRIG, CHOLHDL, LDLDIRECT in the last 72 hours. Thyroid Function Tests: Recent Labs    05/14/17 0507  TSH 0.224*   Anemia Panel: No results for input(s): VITAMINB12, FOLATE, FERRITIN, TIBC, IRON, RETICCTPCT in the last 72 hours. Urine analysis: No results found for: COLORURINE, APPEARANCEUR, LABSPEC, PHURINE, GLUCOSEU, HGBUR, BILIRUBINUR, KETONESUR, PROTEINUR, UROBILINOGEN, NITRITE, LEUKOCYTESUR   Trevionne Advani M.D. [facility] 05/15/2017, 1:43 PM  Pager: [phone] Between 7am to 7pm - call Pager - [phone]  After 7pm go to [email or web address] - password [facility]  Call night coverage person covering after 7pm

## 2017-05-16 DIAGNOSIS — J411 Mucopurulent chronic bronchitis: Secondary | ICD-10-CM

## 2017-05-16 DIAGNOSIS — I2510 Atherosclerotic heart disease of native coronary artery without angina pectoris: Secondary | ICD-10-CM

## 2017-05-16 LAB — CBC
HCT: 33.2 % — ABNORMAL LOW (ref 36.0–46.0)
HEMOGLOBIN: 10.5 g/dL — AB (ref 12.0–15.0)
MCH: 31.1 pg (ref 26.0–34.0)
MCHC: 31.6 g/dL (ref 30.0–36.0)
MCV: 98.2 fL (ref 78.0–100.0)
PLATELETS: 222 10*3/uL (ref 150–400)
RBC: 3.38 MIL/uL — AB (ref 3.87–5.11)
RDW: 14.5 % (ref 11.5–15.5)
WBC: 12.7 10*3/uL — AB (ref 4.0–10.5)

## 2017-05-16 MED ORDER — IPRATROPIUM-ALBUTEROL 0.5-2.5 (3) MG/3ML IN SOLN
3.0000 mL | Freq: Four times a day (QID) | RESPIRATORY_TRACT | Status: DC
  Administered 2017-05-16: 3 mL via RESPIRATORY_TRACT
  Filled 2017-05-16: qty 3

## 2017-05-16 MED ORDER — ARFORMOTEROL TARTRATE 15 MCG/2ML IN NEBU
15.0000 ug | INHALATION_SOLUTION | Freq: Two times a day (BID) | RESPIRATORY_TRACT | Status: DC
  Administered 2017-05-16 – 2017-05-19 (×6): 15 ug via RESPIRATORY_TRACT
  Filled 2017-05-16 (×8): qty 2

## 2017-05-16 MED ORDER — BUDESONIDE 0.5 MG/2ML IN SUSP
0.5000 mg | Freq: Two times a day (BID) | RESPIRATORY_TRACT | Status: DC
  Administered 2017-05-16 – 2017-05-19 (×6): 0.5 mg via RESPIRATORY_TRACT
  Filled 2017-05-16 (×8): qty 2

## 2017-05-16 MED ORDER — METHYLPREDNISOLONE SODIUM SUCC 40 MG IJ SOLR
40.0000 mg | Freq: Three times a day (TID) | INTRAMUSCULAR | Status: DC
  Administered 2017-05-16 – 2017-05-19 (×9): 40 mg via INTRAVENOUS
  Filled 2017-05-16 (×9): qty 1

## 2017-05-16 NOTE — Evaluation (Signed)
Physical Therapy Evaluation Patient Details Name: Kristin Peterson MRN: [id number] DOB: 11-07-1934 Today's Date: 05/16/2017   History of Present Illness  83 year old female, resident of [facility], COPD, chronic respiratory failure on home oxygen 2 L/min at bedtime, stage IV squamous cell lung cancer (Dr.Mohamed Mohamed, oncology), dementia, CAD status post CABG, A. fib not on anticoagulation, chronic diastolic CHF, chronic kidney disease, chronic anemia presented to ED with progressive dyspnea, dry cough and history of exposure to sick contact with similar symptoms at ALF, VQ scan suspicious for PE  Clinical Impression  Pt admitted with above diagnosis. Pt currently with functional limitations due to the deficits listed below (see PT Problem List).  Pt requiring mod assist for bed to chair transfers, pt is unable to tell me her baseline functional status--recommend HHPT at ALF vs SNF depending on level of  care ALF able to provide; will follow in acute setting  Pt will benefit from skilled PT to increase their independence and safety with mobility to allow discharge to the venue listed below.       Follow Up Recommendations Home health PT;SNF(HHPT at ALF vs SNF)    Equipment Recommendations  Other (comment)(TBD)    Recommendations for Other Services       Precautions / Restrictions Precautions Precautions: Fall Restrictions Weight Bearing Restrictions: No      Mobility  Bed Mobility Overal bed mobility: Needs Assistance Bed Mobility: Supine to Sit     Supine to sit: Mod assist     General bed mobility comments: assist to elevate trunk, bed pad utilized for positioning and scooting to EOB, incr time needed  Transfers Overall transfer level: Needs assistance   Transfers: Sit to/from Stand;Stand Pivot Transfers Sit to Stand: Min assist Stand pivot transfers: Mod assist;Min assist       General transfer comment: assist to rise and stabilize, assist to pivot to pivot  to chair, pt declined use of RW, cues for technique and safety  Ambulation/Gait                Stairs            Wheelchair Mobility    Modified Rankin (Stroke Patients Only)       Balance Overall balance assessment: Needs assistance   Sitting balance-Leahy Scale: Fair       Standing balance-Leahy Scale: Poor Standing balance comment: reliant on UE support                             Pertinent Vitals/Pain Pain Assessment: No/denies pain    Home Living Family/patient expects to be discharged to:: Assisted living                 Additional Comments: pt is unreliable historian, she cannot tell me her baseline functional status    Prior Function           Comments: unsure     Hand Dominance        Extremity/Trunk Assessment   Upper Extremity Assessment Upper Extremity Assessment: Generalized weakness    Lower Extremity Assessment Lower Extremity Assessment: Generalized weakness       Communication   Communication: No difficulties  Cognition Arousal/Alertness: Awake/alert Behavior During Therapy: WFL for tasks assessed/performed Overall Cognitive Status: History of cognitive impairments - at baseline  General Comments      Exercises     Assessment/Plan    PT Assessment Patient needs continued PT services  PT Problem List Decreased strength;Decreased activity tolerance;Decreased balance;Decreased knowledge of use of DME;Decreased mobility       PT Treatment Interventions DME instruction;Therapeutic activities;Therapeutic exercise;Gait training;Functional mobility training;Balance training;Patient/family education    PT Goals (Current goals can be found in the Care Plan section)  Acute Rehab PT Goals Patient Stated Goal: to feel better PT Goal Formulation: With patient Time For Goal Achievement: 05/30/17 Potential to Achieve Goals: Good    Frequency Min  3X/week   Barriers to discharge        Co-evaluation               AM-PAC PT "6 Clicks" Daily Activity  Outcome Measure Difficulty turning over in bed (including adjusting bedclothes, sheets and blankets)?: Unable Difficulty moving from lying on back to sitting on the side of the bed? : Unable Difficulty sitting down on and standing up from a chair with arms (e.g., wheelchair, bedside commode, etc,.)?: Unable Help needed moving to and from a bed to chair (including a wheelchair)?: A Lot Help needed walking in hospital room?: A Lot Help needed climbing 3-5 steps with a railing? : Total 6 Click Score: 8    End of Session Equipment Utilized During Treatment: Gait belt Activity Tolerance: Patient tolerated treatment well;Patient limited by fatigue Patient left: in chair;with call bell/phone within reach(no alarm pads available)        Time: 1440-1455 PT Time Calculation (min) (ACUTE ONLY): 15 min   Charges:   PT Evaluation $PT Eval Low Complexity: 1 Low     PT G Codes:          Kristin Peterson 05/31/2017, 4:07 PM

## 2017-05-16 NOTE — Progress Notes (Signed)
[facility]                                                                              Patient Demographics  Kristin Peterson, is a 83 y.o. female, DOB - 20-Mar-1934, LPF:[id number]  Admit date - 05/13/2017   Admitting Physician Rise Patience, MD  Outpatient Primary MD for the patient is [facility]  Outpatient specialists:   LOS - 2  days   Medical records reviewed and are as summarized below:    Chief Complaint  Patient presents with  . Shortness of Breath       Brief summary   83 year old female, resident of [facility], COPD, chronic respiratory failure on home oxygen 2 L/min at bedtime, stage IV squamous cell lung cancer (Dr.Mohamed Mohamed, oncology), dementia, CAD status post CABG, A. fib not on anticoagulation, chronic diastolic CHF, chronic kidney disease, chronic anemia presented to ED with progressive dyspnea of 2 days duration, dry cough and history of exposure to sick contact with similar symptoms at ALF.  In the ED noted to be hypoxic.  CT chest without contrast findings suggestive of right lower lobe pneumonia.  Admitted for further evaluation and management.  Assessment & Plan    Principal Problem:   Acute respiratory failure with hypoxia (HCC) secondary to community-acquired pneumonia/ RLL -Chest x-ray showed no acute disease however CT chest without contrast showed new patchy nodular airspace opacities right lower lobe consistent with pneumonia. -Blood cultures negative so far, flu panel negative. -Patient was placed on Rocephin, Zithromax however patient on Tikosyn, azithromycin was changed to doxycycline. -VQ scan showed peripheral wedge-shaped perfusion defects in the left lung suspicious for acute PE -Discussed with Dr. Earlie Server on 5/8, patient is high risk candidate for pulmonary embolism due to malignancy.  Recommended, no need to repeat CT angiogram, place on 6 months of Lovenox, will follow outpatient.  If patient  has any GI bleeding, will need to DC Lovenox at that point.  Active Problems: COPD exacerbation -Still wheezing, added Brovana, increase Pulmicort 2.5 mg twice daily  -Placed on scheduled duo nebs, increase IV Solu-Medrol to 40 mg every 8 hours, continue doxycycline  -Continue flutter valve    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) -Sees Dr. Julien Nordmann outpatient, discussed in detail, will follow.  Hypothyroidism -Continue Synthroid    PAF (paroxysmal atrial fibrillation) (HCC) -Continue metoprolol, Tikosyn -Currently on aspirin  Mild acute on CKD (chronic kidney disease) stage 3, GFR 30-59 ml/min (HCC) -Creatinine improving, baseline creatinine 1.3.  -  Creatinine on admission 1.6.  Improved to 1.2  CAD status post CABG -No chest pain, mild troponin elevation likely due to acute respiratory failure.  Currently on aspirin, statin, beta-blocker.  Dementia with anxiety -Continue Namenda  Code Status: Full code DVT Prophylaxis: IV heparin Family Communication: Discussed in detail with the patient, all imaging results, lab results explained to the patient  Disposition Plan: If no significant improvement in the next 24 to 48 hours, will discuss with palliative medicine for goals of care  Time Spent in minutes 35 minutes  Procedures:  VQ SCAN   Consultants:   Oncology-  Dr Earlie Server   Antimicrobials:      Medications  Scheduled Meds: . aspirin EC  81 mg Oral Daily  . atorvastatin  40 mg Oral Daily  . budesonide (PULMICORT) nebulizer solution  0.25 mg Nebulization BID  . clonazepam  0.75 mg Oral QHS  . dofetilide  125 mcg Oral BID  . doxycycline  100 mg Oral Q12H  . enoxaparin (LOVENOX) injection  1 mg/kg Subcutaneous Q24H  . furosemide  40 mg Oral Daily  . gabapentin  100 mg Oral TID  . hydroxychloroquine  200 mg Oral Daily  . levothyroxine  88 mcg Oral QAC breakfast  . loratadine  10 mg Oral Daily  . mouth rinse  15 mL Mouth Rinse BID  . memantine  10  mg Oral BID  . methylPREDNISolone (SOLU-MEDROL) injection  40 mg Intravenous Q12H  . metoprolol tartrate  25 mg Oral Daily  . mirtazapine  7.5 mg Oral QHS  . pantoprazole  40 mg Oral Daily  . potassium chloride SA  20 mEq Oral Daily  . rivastigmine  4.6 mg Transdermal Daily  . sertraline  100 mg Oral Daily  . sodium chloride  1 g Oral BID WC  . traMADol  50 mg Oral BID   Continuous Infusions: . cefTRIAXone (ROCEPHIN)  IV Stopped (05/15/17 2233)   PRN Meds:.acetaminophen **OR** acetaminophen, albuterol, clonazepam, guaifenesin, ondansetron **OR** ondansetron (ZOFRAN) IV, polyethylene glycol   Antibiotics   Anti-infectives (From admission, onward)   Start     Dose/Rate Route Frequency Ordered Stop   05/14/17 2200  cefTRIAXone (ROCEPHIN) 1 g in sodium chloride 0.9 % 100 mL IVPB     1 g 200 mL/hr over 30 Minutes Intravenous Every 24 hours 05/14/17 1600 05/21/17 2159   05/14/17 2200  doxycycline (VIBRA-TABS) tablet 100 mg     100 mg Oral Every 12 hours 05/14/17 1600 05/21/17 0959   05/14/17 2000  cefTRIAXone (ROCEPHIN) 1 g in sodium chloride 0.9 % 100 mL IVPB  Status:  Discontinued     1 g 200 mL/hr over 30 Minutes Intravenous Every 24 hours 05/14/17 0011 05/14/17 1058   05/14/17 2000  azithromycin (ZITHROMAX) 500 mg in sodium chloride 0.9 % 250 mL IVPB  Status:  Discontinued     500 mg 250 mL/hr over 60 Minutes Intravenous Every 24 hours 05/14/17 0011 05/14/17 1058   05/14/17 1200  doxycycline (VIBRAMYCIN) 100 mg in sodium chloride 0.9 % 250 mL IVPB  Status:  Discontinued     100 mg 125 mL/hr over 120 Minutes Intravenous 2 times daily 05/14/17 1059 05/14/17 1600   05/14/17 1000  hydroxychloroquine (PLAQUENIL) tablet 200 mg     200 mg Oral Daily 05/14/17 0011     05/13/17 2230  cefTRIAXone (ROCEPHIN) 1 g in sodium chloride 0.9 % 100 mL IVPB     1 g 200 mL/hr over 30 Minutes Intravenous  Once 05/13/17 2216 05/14/17 0003   05/13/17 2230  azithromycin (ZITHROMAX) 500 mg in sodium  chloride 0.9 % 250 mL IVPB     500 mg 250 mL/hr over 60 Minutes Intravenous  Once 05/13/17 2216 05/14/17 0003        Subjective:   Noya Santarelli was seen and examined today.  Still feels short of breath, wheezing.  No fevers or chills.  Patient denies dizziness, abdominal pain, N/V/D/C, new weakness, numbess, tingling. No acute events overnight.    Objective:   Vitals:   05/16/17 0547 05/16/17 0905 05/16/17 1027 05/16/17 1421  BP: Marland Kitchen)  121/59  134/67 (!) 144/64  Pulse: 73  80 62  Resp: 16   16  Temp: 97.9 F (36.6 C)   98.2 F (36.8 C)  TempSrc: Oral   Oral  SpO2: 100% 97%  99%  Weight: 49.9 kg (110 lb)     Height:        Intake/Output Summary (Last 24 hours) at 05/16/2017 1521 Last data filed at 05/16/2017 1421 Gross per 24 hour  Intake 360 ml  Output 400 ml  Net -40 ml     Wt Readings from Last 3 Encounters:  05/16/17 49.9 kg (110 lb)  03/27/17 49.9 kg (109 lb 14.4 oz)  03/22/17 50.3 kg (110 lb 12.8 oz)     Exam   General: Alert and oriented, NAD, short of breath on talking  Eyes:   HEENT:    Cardiovascular: S1 S2 auscultated, Regular rate and rhythm. No pedal edema b/l  Respiratory: b/l diffuse expiratory wheezing   Gastrointestinal: Soft, nontender, nondistended, + bowel sounds  Ext: no pedal edema bilaterally  Neuro: no new deficits  Musculoskeletal: No digital cyanosis, clubbing  Skin: No rashes  Psych: Normal affect and demeanor   Data Reviewed:  I have personally reviewed following labs and imaging studies  Micro Results Recent Results (from the past 240 hour(s))  Blood culture (routine x 2)     Status: None (Preliminary result)   Collection Time: 05/13/17  6:34 PM  Result Value Ref Range Status   Specimen Description BLOOD RIGHT ANTECUBITAL  Final   Special Requests   Final    BOTTLES DRAWN AEROBIC AND ANAEROBIC Blood Culture results may not be optimal due to an excessive volume of blood received in culture bottles   Culture   Final      NO GROWTH 3 DAYS Performed at [facility], [place] [address], [place], [place] [address]    Report Status PENDING  Incomplete  Blood culture (routine x 2)     Status: None (Preliminary result)   Collection Time: 05/13/17  8:10 PM  Result Value Ref Range Status   Specimen Description BLOOD LEFT ANTECUBITAL  Final   Special Requests   Final    BOTTLES DRAWN AEROBIC AND ANAEROBIC Blood Culture results may not be optimal due to an excessive volume of blood received in culture bottles   Culture   Final    NO GROWTH 3 DAYS Performed at [facility], [place] [address] [place], [place] [address]    Report Status PENDING  Incomplete    Radiology Reports Dg Chest 2 View  Result Date: 05/13/2017 CLINICAL DATA:  Shortness of breath.  COPD. EXAM: CHEST - 2 VIEW COMPARISON:  CT chest 03/25/2017. FINDINGS: The heart size and mediastinal contours are within normal limits. Both lungs are free of consolidation or edema. The visualized skeletal structures are unremarkable. Old rib fractures. Prior CABG. Thoracic atherosclerosis. IMPRESSION: COPD.  No active disease. Electronically Signed   By: Staci Righter M.D.   On: 05/13/2017 19:05   Ct Chest Wo Contrast  Result Date: 05/13/2017 CLINICAL DATA:  Shortness of breath. COPD. Stroke [place] cell carcinoma of the LEFT lung. EXAM: CT CHEST WITHOUT CONTRAST TECHNIQUE: Multidetector CT imaging of the chest was performed following the standard protocol without IV contrast. COMPARISON:  CT chest 03/25/2017.  Chest radiograph earlier today. FINDINGS: Cardiovascular: Heart size is within normal limits. No significant pericardial fluid or thickening. Three-vessel coronary atherosclerosis. Previous CABG. Atherosclerotic nonaneurysmal thoracic aorta. Pulmonary artery caliber is  within normal limits. LEFT internal jugular Port-A-Cath terminates at the cavoatrial junction. Mediastinum/Nodes: No discrete thyroid nodules. Unremarkable esophagus. No pathologically  enlarged nodes, within limits for detection on noncontrast exam. Lungs/Pleura: No pneumothorax or pleural effusion. Severe centrilobular emphysema. New patchy nodular peribronchial consolidation in the lateral basal segment RIGHT lower lobe. Previous posterior basal infiltrate is improved. Stable mildly thickened apical LEFT upper lobe parenchymal band. Stable bullous lesion medial LEFT upper lobe. Upper Abdomen: No acute findings. Musculoskeletal: Old rib fractures.  Thoracic spondylosis. IMPRESSION: New patchy nodular airspace opacities RIGHT lower lobe lateral basal segment consistent with acute pneumonia. This is difficult to appreciate on the earlier chest radiograph but represents a change from previous CT in March. There is resolution of the previous RIGHT lower lobe posterior basal segment infiltrate. Aortic Atherosclerosis (ICD10-I70.0) and Emphysema (ICD10-J43.9). Electronically Signed   By: Staci Righter M.D.   On: 05/13/2017 22:09   Nm Pulmonary Perfusion  Result Date: 05/14/2017 CLINICAL DATA:  83 year old female with chronic respiratory disease on home oxygen. Stage IV lung cancer. Progressive shortness of breath for 2 days with nonproductive cough. EXAM: NUCLEAR MEDICINE PERFUSION SCAN TECHNIQUE: Perfusion images were obtained in multiple projections after intravenous injection of radiopharmaceutical. RADIOPHARMACEUTICALS:  4.0 mCi Tc8m MAA-IV only (no ventilation agent) COMPARISON:  Chest CT 05/13/2017. FINDINGS: Ventilation: Not performed. Perfusion: Large lung volumes. Gradient like decreased perfusion radiotracer activity in the upper lobes, more so the right. Focal increased radiotracer activity in the right lower lobe. Additionally, there are peripheral wedge-shaped areas of decreased perfusion activity in the left lateral and upper lung, best demonstrated on the posterior image. IMPRESSION: Perfusion-only imaging which is complicated by chronic lung disease, but peripheral wedge-shaped  perfusion defects in the left lung are suspicious for acute pulmonary embolus. Electronically Signed   By: Genevie Ann M.D.   On: 05/14/2017 19:02    Lab Data:  CBC: Recent Labs  Lab 05/13/17 1835 05/14/17 0507 05/15/17 0410 05/16/17 0551  WBC 8.4 12.4* 14.7* 12.7*  NEUTROABS 5.4  --   --   --   HGB 10.8* 10.0* 10.1* 10.5*  HCT 35.5* 32.4* 33.0* 33.2*  MCV 100.9* 99.1 98.5 98.2  PLT 255 231 215 517   Basic Metabolic Panel: Recent Labs  Lab 05/13/17 1835 05/14/17 0507 05/15/17 0410  NA 140 141 138  K 4.1 4.0 4.1  CL 101 103 99*  CO2 29 26 27   GLUCOSE 122* 137* 106*  BUN 25* 23* 19  CREATININE 1.60* 1.59* 1.26*  CALCIUM 8.9 8.9 8.9   GFR: Estimated Creatinine Clearance: 28.1 mL/min (A) (by C-G formula based on SCr of 1.26 mg/dL (H)). Liver Function Tests: No results for input(s): AST, ALT, ALKPHOS, BILITOT, PROT, ALBUMIN in the last 168 hours. No results for input(s): LIPASE, AMYLASE in the last 168 hours. No results for input(s): AMMONIA in the last 168 hours. Coagulation Profile: No results for input(s): INR, PROTIME in the last 168 hours. Cardiac Enzymes: Recent Labs  Lab 05/14/17 0507 05/14/17 1204  TROPONINI <0.03 0.03*   BNP (last 3 results) No results for input(s): PROBNP in the last 8760 hours. HbA1C: No results for input(s): HGBA1C in the last 72 hours. CBG: No results for input(s): GLUCAP in the last 168 hours. Lipid Profile: No results for input(s): CHOL, HDL, LDLCALC, TRIG, CHOLHDL, LDLDIRECT in the last 72 hours. Thyroid Function Tests: Recent Labs    05/14/17 0507  TSH 0.224*   Anemia Panel: No results for input(s): VITAMINB12, FOLATE, FERRITIN, TIBC, IRON, RETICCTPCT  in the last 72 hours. Urine analysis: No results found for: COLORURINE, APPEARANCEUR, LABSPEC, PHURINE, GLUCOSEU, HGBUR, BILIRUBINUR, KETONESUR, PROTEINUR, UROBILINOGEN, NITRITE, LEUKOCYTESUR   Tanelle Lanzo M.D. [facility] 05/16/2017, 3:21 PM  Pager: [phone] Between  7am to 7pm - call Pager - [phone]  After 7pm go to [email or web address] - password [email or web address]  Call night coverage person covering after 7pm

## 2017-05-17 DIAGNOSIS — Z515 Encounter for palliative care: Secondary | ICD-10-CM

## 2017-05-17 DIAGNOSIS — J181 Lobar pneumonia, unspecified organism: Principal | ICD-10-CM

## 2017-05-17 DIAGNOSIS — J9601 Acute respiratory failure with hypoxia: Secondary | ICD-10-CM

## 2017-05-17 DIAGNOSIS — J42 Unspecified chronic bronchitis: Secondary | ICD-10-CM

## 2017-05-17 DIAGNOSIS — Z7189 Other specified counseling: Secondary | ICD-10-CM

## 2017-05-17 LAB — MRSA PCR SCREENING: MRSA by PCR: NEGATIVE

## 2017-05-17 MED ORDER — IPRATROPIUM-ALBUTEROL 0.5-2.5 (3) MG/3ML IN SOLN
3.0000 mL | Freq: Four times a day (QID) | RESPIRATORY_TRACT | Status: DC
  Administered 2017-05-17 – 2017-05-20 (×8): 3 mL via RESPIRATORY_TRACT
  Filled 2017-05-17 (×12): qty 3

## 2017-05-17 NOTE — Clinical Social Work Note (Signed)
Clinical Social Work Assessment  Patient Details  Name: Kristin Peterson MRN: [id number] Date of Birth: 01/02/1935  Date of referral:  05/17/17               Reason for consult:  Discharge Planning                Permission sought to share information with:  Facility Sport and exercise psychologist, Family Supports Permission granted to share information::  No  Name::     Darla Lesches::  [facility]/SNFs  Relationship::  Son  Sport and exercise psychologist Information:  [phone]  Housing/Transportation Living arrangements for the past 2 months:  [place] of Information:  Adult Children Patient Interpreter Needed:  None Criminal Activity/Legal Involvement Pertinent to Current Situation/Hospitalization:  No - Comment as needed Significant Relationships:  Adult Children Lives with:  Facility Resident Do you feel safe going back to the place where you live?  No Need for family participation in patient care:  No (Coment)  Care giving concerns:  CSW received consult regarding discharge planning. CSW spoke with patient's son. He reported that patient resides at [facility]. He stated that if they can accept her back, he would be fine with that. If not, he is agreeable to SNF placement at time of discharge if ALF can't do the rehab. CSW to continue to follow and assist with discharge planning needs.   Social Worker assessment / plan:  CSW spoke with patient's son concerning possibility of rehab at [facility] before returning home.  Employment status:  Retired Health visitor PT Recommendations:  Home with [employer], [place] / Referral to community resources:  [place]  Patient/Family's Response to care:  Patient's son open to all discharge options. ALF requesting therapy notes.  Patient/Family's Understanding of and Emotional Response to Diagnosis, Current Treatment, and Prognosis:  Patient/family is realistic regarding  therapy needs and expressed being hopeful for a helpful discharge plan. Patient's son expressed understanding of CSW role and discharge process as well as medical condition. No questions/concerns about plan or treatment.    Emotional Assessment Appearance:  Appears stated age Attitude/Demeanor/Rapport:  Unable to Assess Affect (typically observed):  Unable to Assess Orientation:  Oriented to Self, Oriented to Place, Oriented to Situation Alcohol / Substance use:  Not Applicable Psych involvement (Current and /or in the community):  No (Comment)  Discharge Needs  Concerns to be addressed:  Care Coordination Readmission within the last 30 days:  No Current discharge risk:  None Barriers to Discharge:  Continued Medical Work up   [employer], [place] 05/17/2017, 3:03 PM

## 2017-05-17 NOTE — Care Management Note (Addendum)
Case Management Note  Patient Details  Name: SIMONNE BOULOS MRN: [id number] Date of Birth: [birth year]  Subjective/Objective:            Patient from [facility]. She gets assistance with meds, and application/ reminder of her nocturnal oxygen.  She uses a WC, is able to stand pivot to toilet and dresses herself. Per son who was present for conversation, patient is able to dress herself. He understands that she will likely need a higher level of care at DC. He states he is able to speak to Palliative today, and it can be over the phone as he is leaving in 20 minutes. No palliative member listed on care team to page at this time.        Action/Plan:   Expected Discharge Date:  05/16/17               Expected Discharge Plan:     In-House Referral:  Clinical Social Work  Discharge planning Services  CM Consult  Post Acute Care Choice:    Choice offered to:     DME Arranged:    DME Agency:     HH Arranged:    HH Agency:     Status of Service:  In process, will continue to follow  If discussed at Long Length of Stay Meetings, dates discussed:    Additional Comments:  Carles Collet, RN 05/17/2017, 10:16 AM

## 2017-05-17 NOTE — Consult Note (Signed)
Consultation Note Date: 05/17/2017   Patient Name: Kristin Peterson  DOB: 1934/08/25  MRN: [id number]  Age / Sex: 83 y.o., female  PCP: [facility] Referring Physician: Mendel Corning, MD  Reason for Consultation: Establishing goals of care and Psychosocial/spiritual support  HPI/Patient Profile: 83 y.o. female  with past medical history of coronary artery disease status post CABG, hypothyroidism, dementia, atrial fib, diastolic heart failure, chronic kidney disease, rheumatoid arthritis, non-small cell lung cancer (diagnosed in 2015; last chemotherapy was in 2016) admitted on 05/13/2017 with increased shortness of breath.  Patient was found per CT scan to have a right lower lobe pneumonia as well as acute PE.  She is being treated with antibiotics steroids and is currently on heparin.  Patient's last office visit with her oncologist, Dr. Earlie Server, was in March 2019.  At that time she had had a CT scan that was suspicious for an inflammatory process in her right lower lobe and she was started on antibiotics with recommendation for follow-up CT scan in 3 months.  As noted, she is now admitted with worsening dyspnea and found to have right lower lobe pneumonia, and PE  Consult ordered for goals of care.    Clinical Assessment and Goals of Care: Patient is alert and oriented to herself.  She recognizes that she is in the hospital but is confused as to date month and location.  She does have a previous medical condition of dementia as noted in her chart as well as per my conversation with her son, Thomes Dinning.  Shanon Brow shares with me that his mother has had a cognitive decline since 2016 and is now living in an assisted living facility in [facility].  She is wheelchair-bound because of severe right hip pain.  He states she will not participate in rehab or even attempt to walk because of the  severity of her pain.  We did talk briefly about her history of cancer and the concern I have with this new PE, that we sometimes see coagulopathy in the setting of cancer.  Shanon Brow acknowledges the risk that his mother could be very ill and her cancer could recur.  He is planning on continued outpatient monitoring with Dr. Earlie Server after discharge  We also discussed CODE STATUS, defined terms.  Patient is a DNR.  Will amend our documentation  Patient is capable of only limited participation in goals of care.  Her healthcare proxy is her son, Thomes Dinning    SUMMARY OF RECOMMENDATIONS   Change CODE STATUS to DNR.  Son informs me that patient has DNR form at the assisted living facility Did discuss with son my concern that a pulmonary emboli could be heralding a change in her oncological status and stressed follow-up with Dr. Earlie Server.  Son states that he will help his mother make those appointments and will follow up Ideally patient would like to return to the assisted living facility that she is currently in if they can administer Lovenox subcu for 6 months as indicated.  If they are unable to, may be looking at skilled nursing facility Code Status/Advance Care Planning:  DNR  Palliative Prophylaxis:   Aspiration, Bowel Regimen, Delirium Protocol, Eye Care, Frequent Pain Assessment, Oral Care and Turn Reposition  Additional Recommendations (Limitations, Scope, Preferences):  No Tracheostomy  Psycho-social/Spiritual:   Desire for further Chaplaincy support:no  Additional Recommendations: Referral to Community Resources   Prognosis:   Unable to determine  Discharge Planning: Back to ALF if they are able to adm lovenox       Primary Diagnoses: Present on Admission: . Acute respiratory failure with hypoxia ([place]) . CAP (community acquired pneumonia) . Stage IV squamous cell carcinoma of left lung ([facility]) . Chronic obstructive pulmonary disease ([place]) . PAF (paroxysmal atrial  fibrillation) ([place]) . CKD (chronic kidney disease) stage 3, GFR 30-59 ml/min ([facility])   I have reviewed the medical record, interviewed the patient and family, and examined the patient. The following aspects are pertinent.  Past Medical History:  Diagnosis Date  . Allergy   . Anxiety   . Arthritis   . CAD (coronary artery disease)   . Cataract   . COPD (chronic obstructive pulmonary disease) ([place])   . Dementia   . Depression   . Myocardial infarction ([place])   . Stage IV squamous cell carcinoma of left lung ([place]) 09/25/2016   Social History   Socioeconomic History  . Marital status: Divorced    Spouse name: Not on file  . Number of children: 1  . Years of education: Not on file  . Highest education level: Not on file  Occupational History  . Not on file  Social Needs  . Financial resource strain: Not on file  . Food insecurity:    Worry: Not on file    Inability: Not on file  . Transportation needs:    Medical: Not on file    Non-medical: Not on file  Tobacco Use  . Smoking status: Former Smoker    Packs/day: 1.00    Years: 40.00    Pack years: 40.00    Types: Cigarettes    Last attempt to quit: 03/22/1996    Years since quitting: 21.1  . Smokeless tobacco: Never Used  Substance and Sexual Activity  . Alcohol use: No  . Drug use: No  . Sexual activity: Not on file  Lifestyle  . Physical activity:    Days per week: Not on file    Minutes per session: Not on file  . Stress: Not on file  Relationships  . Social connections:    Talks on phone: Not on file    Gets together: Not on file    Attends religious service: Not on file    Active member of club or organization: Not on file    Attends meetings of clubs or organizations: Not on file    Relationship status: Not on file  Other Topics Concern  . Not on file  Social History Narrative  . Not on file   Family History  Problem Relation Age of Onset  . Parkinsonism Father    Scheduled Meds: . arformoterol  15  mcg Nebulization BID  . aspirin EC  81 mg Oral Daily  . atorvastatin  40 mg Oral Daily  . budesonide (PULMICORT) nebulizer solution  0.5 mg Nebulization BID  . clonazepam  0.75 mg Oral QHS  . dofetilide  125 mcg Oral BID  . doxycycline  100 mg Oral Q12H  . enoxaparin (LOVENOX) injection  1 mg/kg Subcutaneous Q24H  .  furosemide  40 mg Oral Daily  . gabapentin  100 mg Oral TID  . hydroxychloroquine  200 mg Oral Daily  . ipratropium-albuterol  3 mL Nebulization Q6H WA  . levothyroxine  88 mcg Oral QAC breakfast  . loratadine  10 mg Oral Daily  . mouth rinse  15 mL Mouth Rinse BID  . memantine  10 mg Oral BID  . methylPREDNISolone (SOLU-MEDROL) injection  40 mg Intravenous Q8H  . metoprolol tartrate  25 mg Oral Daily  . mirtazapine  7.5 mg Oral QHS  . pantoprazole  40 mg Oral Daily  . potassium chloride SA  20 mEq Oral Daily  . rivastigmine  4.6 mg Transdermal Daily  . sertraline  100 mg Oral Daily  . sodium chloride  1 g Oral BID WC  . traMADol  50 mg Oral BID   Continuous Infusions: . cefTRIAXone (ROCEPHIN)  IV Stopped (05/16/17 2230)   PRN Meds:.acetaminophen **OR** acetaminophen, albuterol, clonazepam, guaifenesin, ondansetron **OR** ondansetron (ZOFRAN) IV, polyethylene glycol Medications Prior to Admission:  Prior to Admission medications   Medication Sig Start Date End Date Taking? Authorizing Provider  acetaminophen (TYLENOL) 650 MG CR tablet Take 650 mg by mouth every 6 (six) hours.    Yes [provider]  aspirin EC 81 MG tablet Take 1 tablet (81 mg total) by mouth daily. 10/04/16  Yes Lelon Perla, MD  atorvastatin (LIPITOR) 40 MG tablet Take 1 tablet (40 mg total) by mouth daily. 10/04/16 10/04/17 Yes Lelon Perla, MD  budesonide-formoterol [facility]) 160-4.5 MCG/ACT inhaler Inhale 2 puffs into the lungs 2 (two) times daily.   Yes [provider]  clonazePAM (KLONOPIN) 0.5 MG tablet Take 0.75 mg by mouth at bedtime.    Yes [provider]  clonazePAM (KLONOPIN) 0.5 MG tablet Take 0.75 mg by mouth daily as needed for anxiety.   Yes [provider]  Coenzyme Q-10 200 MG CAPS Take 200 mg by mouth 2 (two) times daily.    Yes [provider]  dofetilide (TIKOSYN) 125 MCG capsule Take 125 mcg by mouth 2 (two) times daily.   Yes [provider]  FeFum-FePo-FA-B Cmp-C-Zn-Mn-Cu (TANDEM PLUS PO) Take 1 tablet by mouth daily.   Yes [provider]  furosemide (LASIX) 40 MG tablet Take 40 mg by mouth.   Yes [provider]  gabapentin (NEURONTIN) 100 MG capsule Take 100 mg by mouth 3 (three) times daily.   Yes [provider]  guaifenesin (ROBITUSSIN) 100 MG/5ML syrup Take 100 mg by mouth 4 (four) times daily as needed for cough.   Yes [provider]  hydroxychloroquine (PLAQUENIL) 200 MG tablet Take by mouth daily.   Yes [provider]  levothyroxine (SYNTHROID, LEVOTHROID) 88 MCG tablet Take 88 mcg by mouth daily before breakfast.   Yes [provider]  loperamide (IMODIUM A-D) 2 MG tablet Take 2 mg by mouth 4 (four) times daily as needed for diarrhea or loose stools.   Yes [provider]  loratadine (CLARITIN) 10 MG tablet Take 10 mg by mouth daily.   Yes [provider]  memantine (NAMENDA) 10 MG tablet Take 10 mg by mouth 2 (two) times daily.   Yes [provider]  metoprolol tartrate (LOPRESSOR) 25 MG tablet Take 25 mg by mouth daily.    Yes [provider]  mirtazapine (REMERON) 15 MG tablet Take 7.5 mg by mouth at bedtime.    Yes [provider]  pantoprazole (PROTONIX) 40 MG tablet  Take 40 mg by mouth daily.   Yes [provider]  polyethylene glycol (MIRALAX / GLYCOLAX) packet Take 17 g by mouth daily as needed for mild constipation.   Yes [provider]  potassium chloride SA (K-DUR,KLOR-CON) 20 MEQ tablet Take 20 mEq by mouth daily.    Yes [provider]  predniSONE  (DELTASONE) 5 MG tablet Take 5 mg by mouth daily with breakfast.   Yes [provider]  rivastigmine (EXELON) 4.6 mg/24hr Place 4.6 mg onto the skin daily.   Yes [provider]  sertraline (ZOLOFT) 100 MG tablet Take 100 mg by mouth daily.   Yes [provider]  sodium chloride 1 g tablet Take 1 g by mouth 2 (two) times daily with a meal.   Yes [provider]  tiotropium (SPIRIVA) 18 MCG inhalation capsule Place 18 mcg into inhaler and inhale daily.   Yes [provider]  traMADol (ULTRAM) 50 MG tablet Take 50 mg by mouth 2 (two) times daily.    Yes [provider]   Allergies  Allergen Reactions  . Sulfa Antibiotics   . Contrast Media [Iodinated Diagnostic Agents] Itching and Rash   Review of Systems  Unable to perform ROS: Dementia    Physical Exam  Constitutional:  Ill-appearing female; no acute distress  HENT:  Head: Normocephalic and atraumatic.  Neck: Normal range of motion.  Cardiovascular: Normal rate.  Pulmonary/Chest:  No increased work of breathing at rest; nonproductive cough  Abdominal: Soft.  Neurological: She is alert.  Oriented to self Tells me she still lives in [place] Does recognize her son and that she is in the hospital but does not remember what brought her here Short-term memory deficits present; previous history of dementia  Skin: Skin is warm and dry.  Psychiatric:  Impulsive Fall risk  Nursing note and vitals reviewed.   Vital Signs: BP (!) 137/91 (BP Location: Left Arm)   Pulse 62   Temp 97.9 F (36.6 C) (Axillary)   Resp 17   Ht 5\' 5"  (1.651 m)   Wt 46.3 kg (102 lb)   SpO2 99%   BMI 16.97 kg/m  Pain Scale: 0-10   Pain Score: 0-No pain   SpO2: SpO2: 99 % O2 Device:SpO2: 99 % O2 Flow Rate: .O2 Flow Rate (L/min): 2 L/min  IO: Intake/output summary:   Intake/Output Summary (Last 24 hours) at 05/17/2017 1629 Last data filed at 05/17/2017 1458 Gross per 24 hour    Intake 640 ml  Output -  Net 640 ml    LBM: Last BM Date: 05/17/17 Baseline Weight: Weight: 49.9 kg (110 lb) Most recent weight: Weight: 46.3 kg (102 lb)     Palliative Assessment/Data:   Flowsheet Rows     Most Recent Value  Intake Tab  Referral Department  Hospitalist  Unit at Time of Referral  Med/Surg Unit  Palliative Care Primary Diagnosis  Sepsis/Infectious Disease  Date Notified  05/16/17  Palliative Care Type  New Palliative care  Reason for referral  Clarify Goals of Care  Date of Admission  05/14/17  Date first seen by Palliative Care  05/17/17  # of days Palliative referral response time  1 Day(s)  # of days IP prior to Palliative referral  2  Clinical Assessment  Palliative Performance Scale Score  50%  Pain Max last 24 hours  Not able to report  Pain Min Last 24 hours  Not able to report  Dyspnea Max Last 24 Hours  Not able to report  Dyspnea Min Last 24 hours  Not able to report  Nausea Max Last 24 Hours  Not able to report  Nausea Min Last 24 Hours  Not able to report  Anxiety Max Last 24 Hours  Not able to report  Anxiety Min Last 24 Hours  Not able to report  Other Max Last 24 Hours  Not able to report  Psychosocial & Spiritual Assessment  Palliative Care Outcomes  Patient/Family meeting held?  Yes  Who was at the meeting?  son, pt  Patient/Family wishes: Interventions discontinued/not started   Mechanical Ventilation, Trach  Palliative Care follow-up planned  No      Time In: 1600 Time Out: 1700 Time Total: 60 min Greater than 50%  of this time was spent counseling and coordinating care related to the above assessment and plan.  Signed by: Dory Horn, NP   Please contact Palliative Medicine Team phone at [phone] for questions and concerns.  For individual provider: See Shea Evans

## 2017-05-17 NOTE — Progress Notes (Signed)
[facility]                                                                              Patient Demographics  Kristin Peterson, is a 83 y.o. female, DOB - 09/06/1934, LHT:[id number]  Admit date - 05/13/2017   Admitting Physician Rise Patience, MD  Outpatient Primary MD for the patient is [facility]  Outpatient specialists:   LOS - 3  days   Medical records reviewed and are as summarized below:    Chief Complaint  Patient presents with  . Shortness of Breath       Brief summary   83 year old female, resident of [facility], COPD, chronic respiratory failure on home oxygen 2 L/min at bedtime, stage IV squamous cell lung cancer (Dr.Mohamed Mohamed, oncology), dementia, CAD status post CABG, A. fib not on anticoagulation, chronic diastolic CHF, chronic kidney disease, chronic anemia presented to ED with progressive dyspnea of 2 days duration, dry cough and history of exposure to sick contact with similar symptoms at ALF.  In the ED noted to be hypoxic.  CT chest without contrast findings suggestive of right lower lobe pneumonia.  Admitted for further evaluation and management.  Assessment & Plan    Principal Problem:   Acute respiratory failure with hypoxia (HCC) secondary to community-acquired pneumonia/ RLL, underlying history of lung CA -Chest x-ray showed no acute disease however CT chest without contrast showed new patchy nodular airspace opacities right lower lobe consistent with pneumonia. -Blood cultures negative so far, flu panel negative. -Patient was placed on Rocephin, Zithromax however patient on Tikosyn, azithromycin was changed to doxycycline. -VQ scan showed peripheral wedge-shaped perfusion defects in the left lung suspicious for acute PE -Discussed with Dr. Earlie Server on 5/8, patient is high risk candidate for pulmonary embolism due to malignancy.  Recommended, no need to repeat CT angiogram, place on 6 months of Lovenox, will  follow outpatient.  If patient has any GI bleeding, will need to DC Lovenox at that point. -Patient continues to have tenuous respiratory status, palliative care consult placed, may benefit from hospice at the facility.  I did discuss about hospice with the patient however she did not grasp the idea of hospice.  Active Problems: COPD exacerbation -Wheezing slightly better, continue Brovana, Pulmicort, IV Solu-Medrol, duo nebs.  - continue doxycycline  -Continue flutter valve    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) -Sees Dr. Julien Nordmann outpatient  Hypothyroidism -Continue Synthroid    PAF (paroxysmal atrial fibrillation) (HCC) -Continue metoprolol, Tikosyn -Currently on aspirin  Mild acute on CKD (chronic kidney disease) stage 3, GFR 30-59 ml/min (HCC) -Creatinine improving, baseline creatinine 1.3.  -  Creatinine on admission 1.6.  Improved to 1.2, repeat BMET in a.m.  CAD status post CABG -No chest pain, mild troponin elevation likely due to acute respiratory failure.  Currently on aspirin, statin, beta-blocker.  Dementia with anxiety -Continue Namenda  Code Status: Full code DVT Prophylaxis: IV heparin Family Communication: Discussed in detail with the patient, all imaging results, lab results explained to the patient  Disposition Plan: Await palliative goals of care  Time Spent in minutes 25  minutes  Procedures:  VQ SCAN   Consultants:   Oncology- Dr Earlie Server  Palliative medicine  Antimicrobials:      Medications  Scheduled Meds: . arformoterol  15 mcg Nebulization BID  . aspirin EC  81 mg Oral Daily  . atorvastatin  40 mg Oral Daily  . budesonide (PULMICORT) nebulizer solution  0.5 mg Nebulization BID  . clonazepam  0.75 mg Oral QHS  . dofetilide  125 mcg Oral BID  . doxycycline  100 mg Oral Q12H  . enoxaparin (LOVENOX) injection  1 mg/kg Subcutaneous Q24H  . furosemide  40 mg Oral Daily  . gabapentin  100 mg Oral TID  . hydroxychloroquine   200 mg Oral Daily  . ipratropium-albuterol  3 mL Nebulization Q6H WA  . levothyroxine  88 mcg Oral QAC breakfast  . loratadine  10 mg Oral Daily  . mouth rinse  15 mL Mouth Rinse BID  . memantine  10 mg Oral BID  . methylPREDNISolone (SOLU-MEDROL) injection  40 mg Intravenous Q8H  . metoprolol tartrate  25 mg Oral Daily  . mirtazapine  7.5 mg Oral QHS  . pantoprazole  40 mg Oral Daily  . potassium chloride SA  20 mEq Oral Daily  . rivastigmine  4.6 mg Transdermal Daily  . sertraline  100 mg Oral Daily  . sodium chloride  1 g Oral BID WC  . traMADol  50 mg Oral BID   Continuous Infusions: . cefTRIAXone (ROCEPHIN)  IV Stopped (05/16/17 2230)   PRN Meds:.acetaminophen **OR** acetaminophen, albuterol, clonazepam, guaifenesin, ondansetron **OR** ondansetron (ZOFRAN) IV, polyethylene glycol   Antibiotics   Anti-infectives (From admission, onward)   Start     Dose/Rate Route Frequency Ordered Stop   05/14/17 2200  cefTRIAXone (ROCEPHIN) 1 g in sodium chloride 0.9 % 100 mL IVPB     1 g 200 mL/hr over 30 Minutes Intravenous Every 24 hours 05/14/17 1600 05/21/17 2159   05/14/17 2200  doxycycline (VIBRA-TABS) tablet 100 mg     100 mg Oral Every 12 hours 05/14/17 1600 05/21/17 0959   05/14/17 2000  cefTRIAXone (ROCEPHIN) 1 g in sodium chloride 0.9 % 100 mL IVPB  Status:  Discontinued     1 g 200 mL/hr over 30 Minutes Intravenous Every 24 hours 05/14/17 0011 05/14/17 1058   05/14/17 2000  azithromycin (ZITHROMAX) 500 mg in sodium chloride 0.9 % 250 mL IVPB  Status:  Discontinued     500 mg 250 mL/hr over 60 Minutes Intravenous Every 24 hours 05/14/17 0011 05/14/17 1058   05/14/17 1200  doxycycline (VIBRAMYCIN) 100 mg in sodium chloride 0.9 % 250 mL IVPB  Status:  Discontinued     100 mg 125 mL/hr over 120 Minutes Intravenous 2 times daily 05/14/17 1059 05/14/17 1600   05/14/17 1000  hydroxychloroquine (PLAQUENIL) tablet 200 mg     200 mg Oral Daily 05/14/17 0011     05/13/17 2230   cefTRIAXone (ROCEPHIN) 1 g in sodium chloride 0.9 % 100 mL IVPB     1 g 200 mL/hr over 30 Minutes Intravenous  Once 05/13/17 2216 05/14/17 0003   05/13/17 2230  azithromycin (ZITHROMAX) 500 mg in sodium chloride 0.9 % 250 mL IVPB     500 mg 250 mL/hr over 60 Minutes Intravenous  Once 05/13/17 2216 05/14/17 0003        Subjective:   Kristin Peterson was seen and examined today.  Shortness of breath slightly better however "just does not feel good today" .  No  fevers.    Patient denies dizziness, abdominal pain, N/V/D/C, new weakness, numbess, tingling. No acute events overnight.    Objective:   Vitals:   05/16/17 1421 05/16/17 2126 05/17/17 0557 05/17/17 0559  BP: (!) 144/64 135/64  132/89  Pulse: 62 82  78  Resp: 16 18  16   Temp: 98.2 F (36.8 C) 98 F (36.7 C)  98.6 F (37 C)  TempSrc: Oral Oral  Oral  SpO2: 99% 94%  98%  Weight:   46.3 kg (102 lb)   Height:        Intake/Output Summary (Last 24 hours) at 05/17/2017 1238 Last data filed at 05/17/2017 0940 Gross per 24 hour  Intake 560 ml  Output 400 ml  Net 160 ml     Wt Readings from Last 3 Encounters:  05/17/17 46.3 kg (102 lb)  03/27/17 49.9 kg (109 lb 14.4 oz)  03/22/17 50.3 kg (110 lb 12.8 oz)     Exam   General: Alert and oriented x 3, NAD, still has shortness of breath on conversation although improving.  Eyes:   HEENT:  Atraumatic, normocephalic  Cardiovascular: S1 S2 auscultated, no rubs, murmurs or gallops. Regular rate and rhythm. No pedal edema b/l  Respiratory: Decreased breath sounds with mild expiratory wheezing  Gastrointestinal: Soft, nontender, nondistended, + bowel sounds  Ext: no pedal edema bilaterally  Neuro: no new deficits  Musculoskeletal: No digital cyanosis, clubbing  Skin: No rashes  Psych: Normal affect and demeanor, alert and oriented x3    Data Reviewed:  I have personally reviewed following labs and imaging studies  Micro Results Recent Results (from the past 240  hour(s))  Blood culture (routine x 2)     Status: None (Preliminary result)   Collection Time: 05/13/17  6:34 PM  Result Value Ref Range Status   Specimen Description BLOOD RIGHT ANTECUBITAL  Final   Special Requests   Final    BOTTLES DRAWN AEROBIC AND ANAEROBIC Blood Culture results may not be optimal due to an excessive volume of blood received in culture bottles   Culture   Final    NO GROWTH 4 DAYS Performed at [facility], [place] [address] [place], [place] [address]    Report Status PENDING  Incomplete  Blood culture (routine x 2)     Status: None (Preliminary result)   Collection Time: 05/13/17  8:10 PM  Result Value Ref Range Status   Specimen Description BLOOD LEFT ANTECUBITAL  Final   Special Requests   Final    BOTTLES DRAWN AEROBIC AND ANAEROBIC Blood Culture results may not be optimal due to an excessive volume of blood received in culture bottles   Culture   Final    NO GROWTH 4 DAYS Performed at [facility], [place] [address] [place], [place] [address]    Report Status PENDING  Incomplete  MRSA PCR Screening     Status: None   Collection Time: 05/17/17  5:05 AM  Result Value Ref Range Status   MRSA by PCR NEGATIVE NEGATIVE Final    Comment:        The GeneXpert MRSA Assay (FDA approved for NASAL specimens only), is one component of a comprehensive MRSA colonization surveillance program. It is not intended to diagnose MRSA infection nor to guide or monitor treatment for MRSA infections. Performed at [facility], [place] [address], [place], [place] [address]     Radiology Reports Dg Chest 2 View  Result Date: 05/13/2017 CLINICAL  DATA:  Shortness of breath.  COPD. EXAM: CHEST - 2 VIEW COMPARISON:  CT chest 03/25/2017. FINDINGS: The heart size and mediastinal contours are within normal limits. Both lungs are free of consolidation or edema. The visualized skeletal structures are unremarkable. Old rib fractures. Prior CABG. Thoracic atherosclerosis.  IMPRESSION: COPD.  No active disease. Electronically Signed   By: Staci Righter M.D.   On: 05/13/2017 19:05   Ct Chest Wo Contrast  Result Date: 05/13/2017 CLINICAL DATA:  Shortness of breath. COPD. Stroke [place] cell carcinoma of the LEFT lung. EXAM: CT CHEST WITHOUT CONTRAST TECHNIQUE: Multidetector CT imaging of the chest was performed following the standard protocol without IV contrast. COMPARISON:  CT chest 03/25/2017.  Chest radiograph earlier today. FINDINGS: Cardiovascular: Heart size is within normal limits. No significant pericardial fluid or thickening. Three-vessel coronary atherosclerosis. Previous CABG. Atherosclerotic nonaneurysmal thoracic aorta. Pulmonary artery caliber is within normal limits. LEFT internal jugular Port-A-Cath terminates at the cavoatrial junction. Mediastinum/Nodes: No discrete thyroid nodules. Unremarkable esophagus. No pathologically enlarged nodes, within limits for detection on noncontrast exam. Lungs/Pleura: No pneumothorax or pleural effusion. Severe centrilobular emphysema. New patchy nodular peribronchial consolidation in the lateral basal segment RIGHT lower lobe. Previous posterior basal infiltrate is improved. Stable mildly thickened apical LEFT upper lobe parenchymal band. Stable bullous lesion medial LEFT upper lobe. Upper Abdomen: No acute findings. Musculoskeletal: Old rib fractures.  Thoracic spondylosis. IMPRESSION: New patchy nodular airspace opacities RIGHT lower lobe lateral basal segment consistent with acute pneumonia. This is difficult to appreciate on the earlier chest radiograph but represents a change from previous CT in March. There is resolution of the previous RIGHT lower lobe posterior basal segment infiltrate. Aortic Atherosclerosis (ICD10-I70.0) and Emphysema (ICD10-J43.9). Electronically Signed   By: Staci Righter M.D.   On: 05/13/2017 22:09   Nm Pulmonary Perfusion  Result Date: 05/14/2017 CLINICAL DATA:  83 year old female with chronic  respiratory disease on home oxygen. Stage IV lung cancer. Progressive shortness of breath for 2 days with nonproductive cough. EXAM: NUCLEAR MEDICINE PERFUSION SCAN TECHNIQUE: Perfusion images were obtained in multiple projections after intravenous injection of radiopharmaceutical. RADIOPHARMACEUTICALS:  4.0 mCi Tc65m MAA-IV only (no ventilation agent) COMPARISON:  Chest CT 05/13/2017. FINDINGS: Ventilation: Not performed. Perfusion: Large lung volumes. Gradient like decreased perfusion radiotracer activity in the upper lobes, more so the right. Focal increased radiotracer activity in the right lower lobe. Additionally, there are peripheral wedge-shaped areas of decreased perfusion activity in the left lateral and upper lung, best demonstrated on the posterior image. IMPRESSION: Perfusion-only imaging which is complicated by chronic lung disease, but peripheral wedge-shaped perfusion defects in the left lung are suspicious for acute pulmonary embolus. Electronically Signed   By: Genevie Ann M.D.   On: 05/14/2017 19:02    Lab Data:  CBC: Recent Labs  Lab 05/13/17 1835 05/14/17 0507 05/15/17 0410 05/16/17 0551  WBC 8.4 12.4* 14.7* 12.7*  NEUTROABS 5.4  --   --   --   HGB 10.8* 10.0* 10.1* 10.5*  HCT 35.5* 32.4* 33.0* 33.2*  MCV 100.9* 99.1 98.5 98.2  PLT 255 231 215 378   Basic Metabolic Panel: Recent Labs  Lab 05/13/17 1835 05/14/17 0507 05/15/17 0410  NA 140 141 138  K 4.1 4.0 4.1  CL 101 103 99*  CO2 29 26 27   GLUCOSE 122* 137* 106*  BUN 25* 23* 19  CREATININE 1.60* 1.59* 1.26*  CALCIUM 8.9 8.9 8.9   GFR: Estimated Creatinine Clearance: 26 mL/min (A) (by C-G formula based on SCr  of 1.26 mg/dL (H)). Liver Function Tests: No results for input(s): AST, ALT, ALKPHOS, BILITOT, PROT, ALBUMIN in the last 168 hours. No results for input(s): LIPASE, AMYLASE in the last 168 hours. No results for input(s): AMMONIA in the last 168 hours. Coagulation Profile: No results for input(s): INR,  PROTIME in the last 168 hours. Cardiac Enzymes: Recent Labs  Lab 05/14/17 0507 05/14/17 1204  TROPONINI <0.03 0.03*   BNP (last 3 results) No results for input(s): PROBNP in the last 8760 hours. HbA1C: No results for input(s): HGBA1C in the last 72 hours. CBG: No results for input(s): GLUCAP in the last 168 hours. Lipid Profile: No results for input(s): CHOL, HDL, LDLCALC, TRIG, CHOLHDL, LDLDIRECT in the last 72 hours. Thyroid Function Tests: No results for input(s): TSH, T4TOTAL, FREET4, T3FREE, THYROIDAB in the last 72 hours. Anemia Panel: No results for input(s): VITAMINB12, FOLATE, FERRITIN, TIBC, IRON, RETICCTPCT in the last 72 hours. Urine analysis: No results found for: COLORURINE, APPEARANCEUR, LABSPEC, PHURINE, GLUCOSEU, HGBUR, BILIRUBINUR, KETONESUR, PROTEINUR, UROBILINOGEN, NITRITE, LEUKOCYTESUR   Kanan Sobek M.D. [facility] 05/17/2017, 12:38 PM  Pager: [phone] Between 7am to 7pm - call Pager - [phone]  After 7pm go to [email or web address] - password [email or web address]  Call night coverage person covering after 7pm

## 2017-05-17 NOTE — Progress Notes (Signed)
Physical Therapy Treatment Patient Details Name: Kristin Peterson MRN: [id number] DOB: 1934-10-27 Today's Date: 05/17/2017    History of Present Illness 83 year old female, resident of [facility], COPD, chronic respiratory failure on home oxygen 2 L/min at bedtime, stage IV squamous cell lung cancer (Dr.Mohamed Mohamed, oncology), dementia, CAD status post CABG, A. fib not on anticoagulation, chronic diastolic CHF, chronic kidney disease, chronic anemia presented to ED with progressive dyspnea, dry cough and history of exposure to sick contact with similar symptoms at ALF, VQ scan suspicious for PE    PT Comments    [name] present, he stated pt transfers to [facility] independently at baseline, she doesn't ambulate 2* "bone on bone" in L hip. Min assist for bed to recliner transfer. Instructed pt in  BLE/UEstrengthening exercises. Pt pleasant and puts forth good effort. SaO2 greater than 90% on 2L O2 [place].    Follow Up Recommendations  Home health PT;SNF;Supervision for mobility/OOB;Supervision/Assistance - 24 hour(HHPT at ALF vs SNF)     Equipment Recommendations  Other (comment)(TBD)    Recommendations for Other Services       Precautions / Restrictions Precautions Precautions: Fall Restrictions Weight Bearing Restrictions: No    Mobility  Bed Mobility Overal bed mobility: Needs Assistance Bed Mobility: Supine to Sit     Supine to sit: Min assist     General bed mobility comments:  bed pad utilized for positioning and scooting to EOB, incr time needed  Transfers Overall transfer level: Needs assistance Equipment used: 1 person hand held assist Transfers: Sit to/from [facility] Sit to Stand: Min assist Stand pivot transfers: Min assist       General transfer comment: assist to rise and stabilize, assist to pivot to pivot to chair, pt declined use of RW, cues for technique and safety  Ambulation/Gait                 Stairs              Wheelchair Mobility    Modified Rankin (Stroke Patients Only)       Balance Overall balance assessment: Needs assistance   Sitting balance-Leahy Scale: Fair       Standing balance-Leahy Scale: Poor Standing balance comment: reliant on UE support                            Cognition Arousal/Alertness: Awake/alert Behavior During Therapy: WFL for tasks assessed/performed Overall Cognitive Status: History of cognitive impairments - at baseline                                        Exercises General Exercises - Upper Extremity Shoulder Flexion: AROM;10 reps;Seated General Exercises - Lower Extremity Ankle Circles/Pumps: AROM;Both;10 reps;Seated Heel Slides: AAROM;Both;10 reps Hip ABduction/ADduction: AAROM;Both;10 reps    General Comments        Pertinent Vitals/Pain Pain Assessment: No/denies pain    Home Living                      Prior Function            PT Goals (current goals can now be found in the care plan section) Acute Rehab PT Goals Patient Stated Goal: to feel better PT Goal Formulation: With patient Time For Goal Achievement: 05/30/17 Potential to Achieve Goals: Good Progress towards PT goals:  Progressing toward goals    Frequency    Min 3X/week      PT Plan Current plan remains appropriate    Co-evaluation              AM-PAC PT "6 Clicks" Daily Activity  Outcome Measure  Difficulty turning over in bed (including adjusting bedclothes, sheets and blankets)?: A Lot Difficulty moving from lying on back to sitting on the side of the bed? : Unable Difficulty sitting down on and standing up from a chair with arms (e.g., wheelchair, bedside commode, etc,.)?: Unable Help needed moving to and from a bed to chair (including a wheelchair)?: A Little Help needed walking in hospital room?: Total Help needed climbing 3-5 steps with a railing? : Total 6 Click Score: 9    End of Session  Equipment Utilized During Treatment: Gait belt Activity Tolerance: Patient tolerated treatment well;Patient limited by fatigue Patient left: in chair;with call bell/phone within reach(no alarm pads available) Nurse Communication: Mobility status       Time: 2481-8590 PT Time Calculation (min) (ACUTE ONLY): 16 min  Charges:  $Therapeutic Activity: 8-22 mins                    G Codes:          Philomena Doheny 05/17/2017, 10:45 AM [phone]

## 2017-05-18 LAB — BASIC METABOLIC PANEL
Anion gap: 9 (ref 5–15)
BUN: 49 mg/dL — AB (ref 6–20)
CALCIUM: 9.2 mg/dL (ref 8.9–10.3)
CO2: 31 mmol/L (ref 22–32)
Chloride: 102 mmol/L (ref 101–111)
Creatinine, Ser: 1.58 mg/dL — ABNORMAL HIGH (ref 0.44–1.00)
GFR calc Af Amer: 35 mL/min — ABNORMAL LOW (ref >60)
GFR, EST NON AFRICAN AMERICAN: 30 mL/min — AB (ref >60)
Glucose, Bld: 123 mg/dL — ABNORMAL HIGH (ref 65–99)
POTASSIUM: 4.5 mmol/L (ref 3.5–5.1)
SODIUM: 142 mmol/L (ref 135–145)

## 2017-05-18 LAB — CULTURE, BLOOD (ROUTINE X 2)
Culture: NO GROWTH
Culture: NO GROWTH

## 2017-05-18 MED ORDER — BENZONATATE 100 MG PO CAPS
100.0000 mg | ORAL_CAPSULE | Freq: Three times a day (TID) | ORAL | Status: DC
  Administered 2017-05-18 – 2017-05-20 (×7): 100 mg via ORAL
  Filled 2017-05-18 (×7): qty 1

## 2017-05-18 NOTE — Progress Notes (Signed)
MD notified of patient having a constant cough even after cough syrup is given.

## 2017-05-18 NOTE — Progress Notes (Signed)
ANTICOAGULATION CONSULT NOTE - Follow-Up  Pharmacy Consult for Lovenox Indication: Pulmonary embolism  Allergies  Allergen Reactions  . Sulfa Antibiotics   . Contrast Media [Iodinated Diagnostic Agents] Itching and Rash    Patient Measurements: Height: 5\' 5"  (165.1 cm) Weight: 102 lb (46.3 kg) IBW/kg (Calculated) : 57 Heparin Dosing Weight:51  Vital Signs: Temp: 97.9 F (36.6 C) (05/11 0420) BP: 136/71 (05/11 0942) Pulse Rate: 96 (05/11 0942)  Labs: Recent Labs    05/16/17 0551 05/18/17 0420  HGB 10.5*  --   HCT 33.2*  --   PLT 222  --   CREATININE  --  1.58*    Estimated Creatinine Clearance: 20.8 mL/min (A) (by C-G formula based on SCr of 1.58 mg/dL (H)).  Assessment: 83 yo female with suspected pulmonary embolism per VQ scan. Discussing with oncology and the plan is to transition to Lovenox to treat for 6 months and have outpatient follow-up. The patient was transitioned to Lovenox on 5/8 - pharmacy consulted to dose.   The patient's lovenox dose remains appropriate for renal function. CBC appears stable - no active bleeding noted at this time.   Goal of Therapy:  Anti-Xa level 0.6-1 units/ml 4hrs after LMWH dose given Monitor platelets by anticoagulation protocol: Yes   Plan:  Continue Lovenox 50mg  (~1mg /kg) [place] q24h with CrCl<30 Monitor CBC at least q72h on Lovenox, SCr trend, closely for s/sx bleeding  Thank you for allowing pharmacy to be a part of this patient's care.  Alycia Rossetti, PharmD, BCPS Clinical Pharmacist Pager: [phone] Clinical phone for 05/18/2017 from 7a-3:30p: [phone] If after 3:30p, please call main pharmacy at: [phone] 05/18/2017 12:38 PM

## 2017-05-18 NOTE — Progress Notes (Signed)
[facility]                                                                              Patient Demographics  Kristin Peterson, is a 83 y.o. female, DOB - 10-20-1934, OZD:[id number]  Admit date - 05/13/2017   Admitting Physician Rise Patience, MD  Outpatient Primary MD for the patient is [facility]  Outpatient specialists:   LOS - 4  days   Medical records reviewed and are as summarized below:    Chief Complaint  Patient presents with  . Shortness of Breath       Brief summary   83 year old female, resident of [facility], COPD, chronic respiratory failure on home oxygen 2 L/min at bedtime, stage IV squamous cell lung cancer (Dr.Mohamed Mohamed, oncology), dementia, CAD status post CABG, A. fib not on anticoagulation, chronic diastolic CHF, chronic kidney disease, chronic anemia presented to ED with progressive dyspnea of 2 days duration, dry cough and history of exposure to sick contact with similar symptoms at ALF.  In the ED noted to be hypoxic.  CT chest without contrast findings suggestive of right lower lobe pneumonia.  Admitted for further evaluation and management.  Assessment & Plan    Principal Problem:   Acute respiratory failure with hypoxia (HCC) secondary to community-acquired pneumonia/ RLL, underlying history of lung CA -Chest x-ray showed no acute disease however CT chest without contrast showed new patchy nodular airspace opacities right lower lobe consistent with pneumonia. -Blood cultures negative so far, flu panel negative. -Patient was placed on Rocephin, Zithromax however patient on Tikosyn, azithromycin was changed to doxycycline. -VQ scan showed peripheral wedge-shaped perfusion defects in the left lung suspicious for acute PE -Discussed with Dr. Earlie Server on 5/8, patient is high risk candidate for pulmonary embolism due to malignancy.  Recommended, no need to repeat CT angiogram, place on 6 months of Lovenox, will  follow outpatient.  If patient has any GI bleeding, will need to DC Lovenox at that point. -Appreciate palliative recommendations   Active Problems: COPD exacerbation -Still wheezing, continue Brovana, Pulmicort, IV Solu-Medrol, duo nebs.  - continue doxycycline  -Continue flutter valve    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) -Sees Dr. Julien Nordmann outpatient  Hypothyroidism -Continue Synthroid    PAF (paroxysmal atrial fibrillation) (HCC) -Continue metoprolol, Tikosyn -Currently on aspirin  Mild acute on CKD (chronic kidney disease) stage 3, GFR 30-59 ml/min (HCC) -Creatinine improving, baseline creatinine 1.3.  -  Creatinine on admission 1.6.  -Again 1.5, trended up, hold Lasix today  CAD status post CABG -No chest pain, mild troponin elevation likely due to acute respiratory failure.  Currently on aspirin, statin, beta-blocker.  Dementia with anxiety -Continue Namenda  Code Status: Full code DVT Prophylaxis: IV heparin Family Communication: Discussed in detail with the patient, all imaging results, lab results explained to the patient  Disposition Plan: Awaiting skilled nursing facility  Time Spent in minutes 25 minutes  Procedures:  VQ SCAN   Consultants:   Oncology- Dr Earlie Server  Palliative medicine  Antimicrobials:      Medications  Scheduled Meds: . arformoterol  15 mcg Nebulization  BID  . aspirin EC  81 mg Oral Daily  . atorvastatin  40 mg Oral Daily  . budesonide (PULMICORT) nebulizer solution  0.5 mg Nebulization BID  . clonazepam  0.75 mg Oral QHS  . dofetilide  125 mcg Oral BID  . doxycycline  100 mg Oral Q12H  . enoxaparin (LOVENOX) injection  1 mg/kg Subcutaneous Q24H  . furosemide  40 mg Oral Daily  . gabapentin  100 mg Oral TID  . hydroxychloroquine  200 mg Oral Daily  . ipratropium-albuterol  3 mL Nebulization Q6H WA  . levothyroxine  88 mcg Oral QAC breakfast  . loratadine  10 mg Oral Daily  . mouth rinse  15 mL Mouth Rinse  BID  . memantine  10 mg Oral BID  . methylPREDNISolone (SOLU-MEDROL) injection  40 mg Intravenous Q8H  . metoprolol tartrate  25 mg Oral Daily  . mirtazapine  7.5 mg Oral QHS  . pantoprazole  40 mg Oral Daily  . potassium chloride SA  20 mEq Oral Daily  . rivastigmine  4.6 mg Transdermal Daily  . sertraline  100 mg Oral Daily  . sodium chloride  1 g Oral BID WC  . traMADol  50 mg Oral BID   Continuous Infusions: . cefTRIAXone (ROCEPHIN)  IV Stopped (05/17/17 2223)   PRN Meds:.acetaminophen **OR** acetaminophen, albuterol, clonazepam, guaifenesin, ondansetron **OR** ondansetron (ZOFRAN) IV, polyethylene glycol   Antibiotics   Anti-infectives (From admission, onward)   Start     Dose/Rate Route Frequency Ordered Stop   05/14/17 2200  cefTRIAXone (ROCEPHIN) 1 g in sodium chloride 0.9 % 100 mL IVPB     1 g 200 mL/hr over 30 Minutes Intravenous Every 24 hours 05/14/17 1600 05/21/17 2159   05/14/17 2200  doxycycline (VIBRA-TABS) tablet 100 mg     100 mg Oral Every 12 hours 05/14/17 1600 05/21/17 0959   05/14/17 2000  cefTRIAXone (ROCEPHIN) 1 g in sodium chloride 0.9 % 100 mL IVPB  Status:  Discontinued     1 g 200 mL/hr over 30 Minutes Intravenous Every 24 hours 05/14/17 0011 05/14/17 1058   05/14/17 2000  azithromycin (ZITHROMAX) 500 mg in sodium chloride 0.9 % 250 mL IVPB  Status:  Discontinued     500 mg 250 mL/hr over 60 Minutes Intravenous Every 24 hours 05/14/17 0011 05/14/17 1058   05/14/17 1200  doxycycline (VIBRAMYCIN) 100 mg in sodium chloride 0.9 % 250 mL IVPB  Status:  Discontinued     100 mg 125 mL/hr over 120 Minutes Intravenous 2 times daily 05/14/17 1059 05/14/17 1600   05/14/17 1000  hydroxychloroquine (PLAQUENIL) tablet 200 mg     200 mg Oral Daily 05/14/17 0011     05/13/17 2230  cefTRIAXone (ROCEPHIN) 1 g in sodium chloride 0.9 % 100 mL IVPB     1 g 200 mL/hr over 30 Minutes Intravenous  Once 05/13/17 2216 05/14/17 0003   05/13/17 2230  azithromycin (ZITHROMAX)  500 mg in sodium chloride 0.9 % 250 mL IVPB     500 mg 250 mL/hr over 60 Minutes Intravenous  Once 05/13/17 2216 05/14/17 0003        Subjective:   Kristin Peterson was seen and examined today.  Feeling cold today, wheezing, no fevers.  Patient denies dizziness, abdominal pain, N/V/D/C, new weakness, numbess, tingling. No acute events overnight.    Objective:   Vitals:   05/18/17 0516 05/18/17 0804 05/18/17 0811 05/18/17 0942  BP:    136/71  Pulse:  96  Resp:      Temp:      TempSrc:      SpO2: 98% 98% 97%   Weight:      Height:        Intake/Output Summary (Last 24 hours) at 05/18/2017 1123 Last data filed at 05/18/2017 1000 Gross per 24 hour  Intake 300 ml  Output 900 ml  Net -600 ml     Wt Readings from Last 3 Encounters:  05/17/17 46.3 kg (102 lb)  03/27/17 49.9 kg (109 lb 14.4 oz)  03/22/17 50.3 kg (110 lb 12.8 oz)     Exam   General: Alert and oriented, NAD  Eyes:   HEENT:   Cardiovascular: S1 S2 auscultated, Regular rate and rhythm. No pedal edema b/l  Respiratory: Bilateral wheezing  Gastrointestinal: Soft, nontender, nondistended, + bowel sounds  Ext: no pedal edema bilaterally  Neuro: no new deficits  Musculoskeletal: No digital cyanosis, clubbing  Skin: No rashes  Psych: Normal affect and demeanor, alert and oriented x3    Data Reviewed:  I have personally reviewed following labs and imaging studies  Micro Results Recent Results (from the past 240 hour(s))  Blood culture (routine x 2)     Status: None   Collection Time: 05/13/17  6:34 PM  Result Value Ref Range Status   Specimen Description BLOOD RIGHT ANTECUBITAL  Final   Special Requests   Final    BOTTLES DRAWN AEROBIC AND ANAEROBIC Blood Culture results may not be optimal due to an excessive volume of blood received in culture bottles   Culture   Final    NO GROWTH 5 DAYS Performed at [facility], [place] [address], [place], [place] [address]    Report Status 05/18/2017  FINAL  Final  Blood culture (routine x 2)     Status: None   Collection Time: 05/13/17  8:10 PM  Result Value Ref Range Status   Specimen Description BLOOD LEFT ANTECUBITAL  Final   Special Requests   Final    BOTTLES DRAWN AEROBIC AND ANAEROBIC Blood Culture results may not be optimal due to an excessive volume of blood received in culture bottles   Culture   Final    NO GROWTH 5 DAYS Performed at [facility], [place] [address], [place], [place] [address]    Report Status 05/18/2017 FINAL  Final  MRSA PCR Screening     Status: None   Collection Time: 05/17/17  5:05 AM  Result Value Ref Range Status   MRSA by PCR NEGATIVE NEGATIVE Final    Comment:        The GeneXpert MRSA Assay (FDA approved for NASAL specimens only), is one component of a comprehensive MRSA colonization surveillance program. It is not intended to diagnose MRSA infection nor to guide or monitor treatment for MRSA infections. Performed at [facility], [place] [address], [place], [place] [address]     Radiology Reports Dg Chest 2 View  Result Date: 05/13/2017 CLINICAL DATA:  Shortness of breath.  COPD. EXAM: CHEST - 2 VIEW COMPARISON:  CT chest 03/25/2017. FINDINGS: The heart size and mediastinal contours are within normal limits. Both lungs are free of consolidation or edema. The visualized skeletal structures are unremarkable. Old rib fractures. Prior CABG. Thoracic atherosclerosis. IMPRESSION: COPD.  No active disease. Electronically Signed   By: Staci Righter M.D.   On: 05/13/2017 19:05   Ct Chest Wo Contrast  Result Date: 05/13/2017 CLINICAL DATA:  Shortness of  breath. COPD. Stroke [place] cell carcinoma of the LEFT lung. EXAM: CT CHEST WITHOUT CONTRAST TECHNIQUE: Multidetector CT imaging of the chest was performed following the standard protocol without IV contrast. COMPARISON:  CT chest 03/25/2017.  Chest radiograph earlier today. FINDINGS: Cardiovascular: Heart size is within normal limits. No  significant pericardial fluid or thickening. Three-vessel coronary atherosclerosis. Previous CABG. Atherosclerotic nonaneurysmal thoracic aorta. Pulmonary artery caliber is within normal limits. LEFT internal jugular Port-A-Cath terminates at the cavoatrial junction. Mediastinum/Nodes: No discrete thyroid nodules. Unremarkable esophagus. No pathologically enlarged nodes, within limits for detection on noncontrast exam. Lungs/Pleura: No pneumothorax or pleural effusion. Severe centrilobular emphysema. New patchy nodular peribronchial consolidation in the lateral basal segment RIGHT lower lobe. Previous posterior basal infiltrate is improved. Stable mildly thickened apical LEFT upper lobe parenchymal band. Stable bullous lesion medial LEFT upper lobe. Upper Abdomen: No acute findings. Musculoskeletal: Old rib fractures.  Thoracic spondylosis. IMPRESSION: New patchy nodular airspace opacities RIGHT lower lobe lateral basal segment consistent with acute pneumonia. This is difficult to appreciate on the earlier chest radiograph but represents a change from previous CT in March. There is resolution of the previous RIGHT lower lobe posterior basal segment infiltrate. Aortic Atherosclerosis (ICD10-I70.0) and Emphysema (ICD10-J43.9). Electronically Signed   By: Staci Righter M.D.   On: 05/13/2017 22:09   Nm Pulmonary Perfusion  Result Date: 05/14/2017 CLINICAL DATA:  83 year old female with chronic respiratory disease on home oxygen. Stage IV lung cancer. Progressive shortness of breath for 2 days with nonproductive cough. EXAM: NUCLEAR MEDICINE PERFUSION SCAN TECHNIQUE: Perfusion images were obtained in multiple projections after intravenous injection of radiopharmaceutical. RADIOPHARMACEUTICALS:  4.0 mCi Tc89m MAA-IV only (no ventilation agent) COMPARISON:  Chest CT 05/13/2017. FINDINGS: Ventilation: Not performed. Perfusion: Large lung volumes. Gradient like decreased perfusion radiotracer activity in the upper lobes,  more so the right. Focal increased radiotracer activity in the right lower lobe. Additionally, there are peripheral wedge-shaped areas of decreased perfusion activity in the left lateral and upper lung, best demonstrated on the posterior image. IMPRESSION: Perfusion-only imaging which is complicated by chronic lung disease, but peripheral wedge-shaped perfusion defects in the left lung are suspicious for acute pulmonary embolus. Electronically Signed   By: Genevie Ann M.D.   On: 05/14/2017 19:02    Lab Data:  CBC: Recent Labs  Lab 05/13/17 1835 05/14/17 0507 05/15/17 0410 05/16/17 0551  WBC 8.4 12.4* 14.7* 12.7*  NEUTROABS 5.4  --   --   --   HGB 10.8* 10.0* 10.1* 10.5*  HCT 35.5* 32.4* 33.0* 33.2*  MCV 100.9* 99.1 98.5 98.2  PLT 255 231 215 791   Basic Metabolic Panel: Recent Labs  Lab 05/13/17 1835 05/14/17 0507 05/15/17 0410 05/18/17 0420  NA 140 141 138 142  K 4.1 4.0 4.1 4.5  CL 101 103 99* 102  CO2 29 26 27 31   GLUCOSE 122* 137* 106* 123*  BUN 25* 23* 19 49*  CREATININE 1.60* 1.59* 1.26* 1.58*  CALCIUM 8.9 8.9 8.9 9.2   GFR: Estimated Creatinine Clearance: 20.8 mL/min (A) (by C-G formula based on SCr of 1.58 mg/dL (H)). Liver Function Tests: No results for input(s): AST, ALT, ALKPHOS, BILITOT, PROT, ALBUMIN in the last 168 hours. No results for input(s): LIPASE, AMYLASE in the last 168 hours. No results for input(s): AMMONIA in the last 168 hours. Coagulation Profile: No results for input(s): INR, PROTIME in the last 168 hours. Cardiac Enzymes: Recent Labs  Lab 05/14/17 0507 05/14/17 1204  TROPONINI <0.03 0.03*   BNP (last  3 results) No results for input(s): PROBNP in the last 8760 hours. HbA1C: No results for input(s): HGBA1C in the last 72 hours. CBG: No results for input(s): GLUCAP in the last 168 hours. Lipid Profile: No results for input(s): CHOL, HDL, LDLCALC, TRIG, CHOLHDL, LDLDIRECT in the last 72 hours. Thyroid Function Tests: No results for  input(s): TSH, T4TOTAL, FREET4, T3FREE, THYROIDAB in the last 72 hours. Anemia Panel: No results for input(s): VITAMINB12, FOLATE, FERRITIN, TIBC, IRON, RETICCTPCT in the last 72 hours. Urine analysis: No results found for: COLORURINE, APPEARANCEUR, LABSPEC, PHURINE, GLUCOSEU, HGBUR, BILIRUBINUR, KETONESUR, PROTEINUR, UROBILINOGEN, NITRITE, LEUKOCYTESUR   Ripudeep Rai M.D. [facility] 05/18/2017, 11:23 AM  Pager: [phone] Between 7am to 7pm - call Pager - [phone]  After 7pm go to [email or web address] - password [email or web address]  Call night coverage person covering after 7pm

## 2017-05-19 LAB — CBC
HEMATOCRIT: 35.2 % — AB (ref 36.0–46.0)
HEMOGLOBIN: 10.9 g/dL — AB (ref 12.0–15.0)
MCH: 30.4 pg (ref 26.0–34.0)
MCHC: 31 g/dL (ref 30.0–36.0)
MCV: 98.1 fL (ref 78.0–100.0)
Platelets: 229 10*3/uL (ref 150–400)
RBC: 3.59 MIL/uL — ABNORMAL LOW (ref 3.87–5.11)
RDW: 14.2 % (ref 11.5–15.5)
WBC: 9.7 10*3/uL (ref 4.0–10.5)

## 2017-05-19 MED ORDER — METHYLPREDNISOLONE SODIUM SUCC 40 MG IJ SOLR
40.0000 mg | Freq: Two times a day (BID) | INTRAMUSCULAR | Status: DC
  Administered 2017-05-19 – 2017-05-20 (×2): 40 mg via INTRAVENOUS
  Filled 2017-05-19 (×2): qty 1

## 2017-05-19 NOTE — Progress Notes (Signed)
[facility]                                                                              Patient Demographics  Kristin Peterson, is a 83 y.o. female, DOB - 25-Apr-1934, QIH:[id number]  Admit date - 05/13/2017   Admitting Physician Rise Patience, MD  Outpatient Primary MD for the patient is [facility]  Outpatient specialists:   LOS - 5  days   Medical records reviewed and are as summarized below:    Chief Complaint  Patient presents with  . Shortness of Breath       Brief summary   83 year old female, resident of [facility], COPD, chronic respiratory failure on home oxygen 2 L/min at bedtime, stage IV squamous cell lung cancer (Dr.Mohamed Mohamed, oncology), dementia, CAD status post CABG, A. fib not on anticoagulation, chronic diastolic CHF, chronic kidney disease, chronic anemia presented to ED with progressive dyspnea of 2 days duration, dry cough and history of exposure to sick contact with similar symptoms at ALF.  In the ED noted to be hypoxic.  CT chest without contrast findings suggestive of right lower lobe pneumonia.  Admitted for further evaluation and management.  Assessment & Plan    Principal Problem:   Acute respiratory failure with hypoxia (HCC) secondary to community-acquired pneumonia/ RLL, underlying history of lung CA -Chest x-ray showed no acute disease however CT chest without contrast showed new patchy nodular airspace opacities right lower lobe consistent with pneumonia. -Blood cultures negative so far, flu panel negative. -Patient was placed on Rocephin, Zithromax however patient on Tikosyn, azithromycin was changed to doxycycline. -VQ scan showed peripheral wedge-shaped perfusion defects in the left lung suspicious for acute PE -Discussed with Dr. Earlie Server on 5/8, patient is high risk candidate for pulmonary embolism due to malignancy.  Recommended, no need to repeat CT angiogram, place on 6 months of Lovenox, will  follow outpatient.  If patient has any GI bleeding, will need to DC Lovenox at that point. -Appreciate palliative recommendations  -Feels better today, social work following, should be stable for DC in a.m. to skilled nursing facility if bed available  Active Problems: COPD exacerbation -Wheezing much improved, continue Brovana, Pulmicort, duo nebs -Continue doxycycline, taper IV steroids, transition to oral prednisone in a.m. -Continue flutter valve    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) -Sees Dr. Julien Nordmann outpatient  Hypothyroidism -Continue Synthroid    PAF (paroxysmal atrial fibrillation) (HCC) -Continue metoprolol, Tikosyn -Currently on aspirin  Mild acute on CKD (chronic kidney disease) stage 3, GFR 30-59 ml/min (HCC) -Creatinine improving, baseline creatinine 1.3.  -  Creatinine on admission 1.6.  -Follow BMET in am   CAD status post CABG -No chest pain, mild troponin elevation likely due to acute respiratory failure.  Currently on aspirin, statin, beta-blocker.  Dementia with anxiety -Continue Namenda  Code Status: Full code DVT Prophylaxis:lovenox Family Communication: Discussed in detail with the patient, all imaging results, lab results explained to the patient  Disposition Plan: Awaiting skilled nursing facility  Time Spent in minutes 25 minutes  Procedures:  VQ SCAN   Consultants:   Oncology- Dr  Mohammed  Palliative medicine  Antimicrobials:      Medications  Scheduled Meds: . arformoterol  15 mcg Nebulization BID  . aspirin EC  81 mg Oral Daily  . atorvastatin  40 mg Oral Daily  . benzonatate  100 mg Oral TID  . budesonide (PULMICORT) nebulizer solution  0.5 mg Nebulization BID  . clonazepam  0.75 mg Oral QHS  . dofetilide  125 mcg Oral BID  . doxycycline  100 mg Oral Q12H  . enoxaparin (LOVENOX) injection  1 mg/kg Subcutaneous Q24H  . gabapentin  100 mg Oral TID  . hydroxychloroquine  200 mg Oral Daily  .  ipratropium-albuterol  3 mL Nebulization Q6H WA  . levothyroxine  88 mcg Oral QAC breakfast  . loratadine  10 mg Oral Daily  . mouth rinse  15 mL Mouth Rinse BID  . memantine  10 mg Oral BID  . methylPREDNISolone (SOLU-MEDROL) injection  40 mg Intravenous Q8H  . metoprolol tartrate  25 mg Oral Daily  . mirtazapine  7.5 mg Oral QHS  . pantoprazole  40 mg Oral Daily  . rivastigmine  4.6 mg Transdermal Daily  . sertraline  100 mg Oral Daily  . sodium chloride  1 g Oral BID WC  . traMADol  50 mg Oral BID   Continuous Infusions: . cefTRIAXone (ROCEPHIN)  IV Stopped (05/18/17 2222)   PRN Meds:.acetaminophen **OR** acetaminophen, albuterol, clonazepam, guaifenesin, ondansetron **OR** ondansetron (ZOFRAN) IV, polyethylene glycol   Antibiotics   Anti-infectives (From admission, onward)   Start     Dose/Rate Route Frequency Ordered Stop   05/14/17 2200  cefTRIAXone (ROCEPHIN) 1 g in sodium chloride 0.9 % 100 mL IVPB     1 g 200 mL/hr over 30 Minutes Intravenous Every 24 hours 05/14/17 1600 05/21/17 2159   05/14/17 2200  doxycycline (VIBRA-TABS) tablet 100 mg     100 mg Oral Every 12 hours 05/14/17 1600 05/21/17 0959   05/14/17 2000  cefTRIAXone (ROCEPHIN) 1 g in sodium chloride 0.9 % 100 mL IVPB  Status:  Discontinued     1 g 200 mL/hr over 30 Minutes Intravenous Every 24 hours 05/14/17 0011 05/14/17 1058   05/14/17 2000  azithromycin (ZITHROMAX) 500 mg in sodium chloride 0.9 % 250 mL IVPB  Status:  Discontinued     500 mg 250 mL/hr over 60 Minutes Intravenous Every 24 hours 05/14/17 0011 05/14/17 1058   05/14/17 1200  doxycycline (VIBRAMYCIN) 100 mg in sodium chloride 0.9 % 250 mL IVPB  Status:  Discontinued     100 mg 125 mL/hr over 120 Minutes Intravenous 2 times daily 05/14/17 1059 05/14/17 1600   05/14/17 1000  hydroxychloroquine (PLAQUENIL) tablet 200 mg     200 mg Oral Daily 05/14/17 0011     05/13/17 2230  cefTRIAXone (ROCEPHIN) 1 g in sodium chloride 0.9 % 100 mL IVPB     1  g 200 mL/hr over 30 Minutes Intravenous  Once 05/13/17 2216 05/14/17 0003   05/13/17 2230  azithromycin (ZITHROMAX) 500 mg in sodium chloride 0.9 % 250 mL IVPB     500 mg 250 mL/hr over 60 Minutes Intravenous  Once 05/13/17 2216 05/14/17 0003        Subjective:   Kristin Peterson was seen and examined today.  Feeling better today, wheezing much improved, afebrile today.  Coughing better.    Patient denies dizziness, abdominal pain, N/V/D/C, new weakness, numbess, tingling. No acute events overnight.    Objective:   Vitals:   05/18/17  2044 05/19/17 0537 05/19/17 0845 05/19/17 0853  BP: (!) 144/67 (!) 148/89  (!) 150/85  Pulse: 84 69  66  Resp: 17 19    Temp: 98 F (36.7 C) 97.9 F (36.6 C)    TempSrc: Oral Oral    SpO2: 96% 96% 96%   Weight:      Height:        Intake/Output Summary (Last 24 hours) at 05/19/2017 1142 Last data filed at 05/19/2017 1127 Gross per 24 hour  Intake 100 ml  Output 1200 ml  Net -1100 ml     Wt Readings from Last 3 Encounters:  05/17/17 46.3 kg (102 lb)  03/27/17 49.9 kg (109 lb 14.4 oz)  03/22/17 50.3 kg (110 lb 12.8 oz)     Exam        General: Alert and oriented x 3, NAD  Eyes:   HEENT:    Cardiovascular: S1 S2 clear. Regular rate and rhythm. No pedal edema b/l  Respiratory: decreased breath sounds at the bases but wheezing much improved  Gastrointestinal: Soft, nontender, nondistended, + bowel sounds  Ext: no pedal edema bilaterally  Neuro: no new deficits   Musculoskeletal: No digital cyanosis, clubbing  Skin: No rashes  Psych: Normal affect and demeanor, alert and oriented x3    Data Reviewed:  I have personally reviewed following labs and imaging studies  Micro Results Recent Results (from the past 240 hour(s))  Blood culture (routine x 2)     Status: None   Collection Time: 05/13/17  6:34 PM  Result Value Ref Range Status   Specimen Description BLOOD RIGHT ANTECUBITAL  Final   Special Requests   Final     BOTTLES DRAWN AEROBIC AND ANAEROBIC Blood Culture results may not be optimal due to an excessive volume of blood received in culture bottles   Culture   Final    NO GROWTH 5 DAYS Performed at [facility], [place] [address], [place], [place] [address]    Report Status 05/18/2017 FINAL  Final  Blood culture (routine x 2)     Status: None   Collection Time: 05/13/17  8:10 PM  Result Value Ref Range Status   Specimen Description BLOOD LEFT ANTECUBITAL  Final   Special Requests   Final    BOTTLES DRAWN AEROBIC AND ANAEROBIC Blood Culture results may not be optimal due to an excessive volume of blood received in culture bottles   Culture   Final    NO GROWTH 5 DAYS Performed at [facility], [place] [address], [place], [place] [address]    Report Status 05/18/2017 FINAL  Final  MRSA PCR Screening     Status: None   Collection Time: 05/17/17  5:05 AM  Result Value Ref Range Status   MRSA by PCR NEGATIVE NEGATIVE Final    Comment:        The GeneXpert MRSA Assay (FDA approved for NASAL specimens only), is one component of a comprehensive MRSA colonization surveillance program. It is not intended to diagnose MRSA infection nor to guide or monitor treatment for MRSA infections. Performed at [facility], [place] [address] [place], [place] [address]     Radiology Reports Dg Chest 2 View  Result Date: 05/13/2017 CLINICAL DATA:  Shortness of breath.  COPD. EXAM: CHEST - 2 VIEW COMPARISON:  CT chest 03/25/2017. FINDINGS: The heart size and mediastinal contours are within normal limits. Both lungs are free of consolidation or edema. The visualized skeletal structures  are unremarkable. Old rib fractures. Prior CABG. Thoracic atherosclerosis. IMPRESSION: COPD.  No active disease. Electronically Signed   By: Staci Righter M.D.   On: 05/13/2017 19:05   Ct Chest Wo Contrast  Result Date: 05/13/2017 CLINICAL DATA:  Shortness of breath. COPD. Stroke [place] cell carcinoma of the LEFT  lung. EXAM: CT CHEST WITHOUT CONTRAST TECHNIQUE: Multidetector CT imaging of the chest was performed following the standard protocol without IV contrast. COMPARISON:  CT chest 03/25/2017.  Chest radiograph earlier today. FINDINGS: Cardiovascular: Heart size is within normal limits. No significant pericardial fluid or thickening. Three-vessel coronary atherosclerosis. Previous CABG. Atherosclerotic nonaneurysmal thoracic aorta. Pulmonary artery caliber is within normal limits. LEFT internal jugular Port-A-Cath terminates at the cavoatrial junction. Mediastinum/Nodes: No discrete thyroid nodules. Unremarkable esophagus. No pathologically enlarged nodes, within limits for detection on noncontrast exam. Lungs/Pleura: No pneumothorax or pleural effusion. Severe centrilobular emphysema. New patchy nodular peribronchial consolidation in the lateral basal segment RIGHT lower lobe. Previous posterior basal infiltrate is improved. Stable mildly thickened apical LEFT upper lobe parenchymal band. Stable bullous lesion medial LEFT upper lobe. Upper Abdomen: No acute findings. Musculoskeletal: Old rib fractures.  Thoracic spondylosis. IMPRESSION: New patchy nodular airspace opacities RIGHT lower lobe lateral basal segment consistent with acute pneumonia. This is difficult to appreciate on the earlier chest radiograph but represents a change from previous CT in March. There is resolution of the previous RIGHT lower lobe posterior basal segment infiltrate. Aortic Atherosclerosis (ICD10-I70.0) and Emphysema (ICD10-J43.9). Electronically Signed   By: Staci Righter M.D.   On: 05/13/2017 22:09   Nm Pulmonary Perfusion  Result Date: 05/14/2017 CLINICAL DATA:  83 year old female with chronic respiratory disease on home oxygen. Stage IV lung cancer. Progressive shortness of breath for 2 days with nonproductive cough. EXAM: NUCLEAR MEDICINE PERFUSION SCAN TECHNIQUE: Perfusion images were obtained in multiple projections after  intravenous injection of radiopharmaceutical. RADIOPHARMACEUTICALS:  4.0 mCi Tc59m MAA-IV only (no ventilation agent) COMPARISON:  Chest CT 05/13/2017. FINDINGS: Ventilation: Not performed. Perfusion: Large lung volumes. Gradient like decreased perfusion radiotracer activity in the upper lobes, more so the right. Focal increased radiotracer activity in the right lower lobe. Additionally, there are peripheral wedge-shaped areas of decreased perfusion activity in the left lateral and upper lung, best demonstrated on the posterior image. IMPRESSION: Perfusion-only imaging which is complicated by chronic lung disease, but peripheral wedge-shaped perfusion defects in the left lung are suspicious for acute pulmonary embolus. Electronically Signed   By: Genevie Ann M.D.   On: 05/14/2017 19:02    Lab Data:  CBC: Recent Labs  Lab 05/13/17 1835 05/14/17 0507 05/15/17 0410 05/16/17 0551 05/19/17 0412  WBC 8.4 12.4* 14.7* 12.7* 9.7  NEUTROABS 5.4  --   --   --   --   HGB 10.8* 10.0* 10.1* 10.5* 10.9*  HCT 35.5* 32.4* 33.0* 33.2* 35.2*  MCV 100.9* 99.1 98.5 98.2 98.1  PLT 255 231 215 222 811   Basic Metabolic Panel: Recent Labs  Lab 05/13/17 1835 05/14/17 0507 05/15/17 0410 05/18/17 0420  NA 140 141 138 142  K 4.1 4.0 4.1 4.5  CL 101 103 99* 102  CO2 29 26 27 31   GLUCOSE 122* 137* 106* 123*  BUN 25* 23* 19 49*  CREATININE 1.60* 1.59* 1.26* 1.58*  CALCIUM 8.9 8.9 8.9 9.2   GFR: Estimated Creatinine Clearance: 20.8 mL/min (A) (by C-G formula based on SCr of 1.58 mg/dL (H)). Liver Function Tests: No results for input(s): AST, ALT, ALKPHOS, BILITOT, PROT, ALBUMIN in the last  168 hours. No results for input(s): LIPASE, AMYLASE in the last 168 hours. No results for input(s): AMMONIA in the last 168 hours. Coagulation Profile: No results for input(s): INR, PROTIME in the last 168 hours. Cardiac Enzymes: Recent Labs  Lab 05/14/17 0507 05/14/17 1204  TROPONINI <0.03 0.03*   BNP (last 3  results) No results for input(s): PROBNP in the last 8760 hours. HbA1C: No results for input(s): HGBA1C in the last 72 hours. CBG: No results for input(s): GLUCAP in the last 168 hours. Lipid Profile: No results for input(s): CHOL, HDL, LDLCALC, TRIG, CHOLHDL, LDLDIRECT in the last 72 hours. Thyroid Function Tests: No results for input(s): TSH, T4TOTAL, FREET4, T3FREE, THYROIDAB in the last 72 hours. Anemia Panel: No results for input(s): VITAMINB12, FOLATE, FERRITIN, TIBC, IRON, RETICCTPCT in the last 72 hours. Urine analysis: No results found for: COLORURINE, APPEARANCEUR, LABSPEC, PHURINE, GLUCOSEU, HGBUR, BILIRUBINUR, KETONESUR, PROTEINUR, UROBILINOGEN, NITRITE, LEUKOCYTESUR   Evren Shankland M.D. [facility] 05/19/2017, 11:42 AM  Pager: [phone] Between 7am to 7pm - call Pager - [phone]  After 7pm go to [email or web address] - password [email or web address]  Call night coverage person covering after 7pm

## 2017-05-20 LAB — BASIC METABOLIC PANEL
ANION GAP: 8 (ref 5–15)
BUN: 46 mg/dL — ABNORMAL HIGH (ref 6–20)
CHLORIDE: 103 mmol/L (ref 101–111)
CO2: 31 mmol/L (ref 22–32)
Calcium: 9.1 mg/dL (ref 8.9–10.3)
Creatinine, Ser: 1.37 mg/dL — ABNORMAL HIGH (ref 0.44–1.00)
GFR calc non Af Amer: 35 mL/min — ABNORMAL LOW (ref >60)
GFR, EST AFRICAN AMERICAN: 41 mL/min — AB (ref >60)
Glucose, Bld: 101 mg/dL — ABNORMAL HIGH (ref 65–99)
POTASSIUM: 4.8 mmol/L (ref 3.5–5.1)
Sodium: 142 mmol/L (ref 135–145)

## 2017-05-20 MED ORDER — ENOXAPARIN SODIUM 40 MG/0.4ML ~~LOC~~ SOLN: 50.0000 mg | SUBCUTANEOUS | 6 refills | Status: DC

## 2017-05-20 MED ORDER — TRAMADOL HCL 50 MG PO TABS: 50.0000 mg | ORAL_TABLET | Freq: Two times a day (BID) | ORAL | 0 refills | Status: DC

## 2017-05-20 MED ORDER — BENZONATATE 100 MG PO CAPS: 100.0000 mg | ORAL_CAPSULE | Freq: Three times a day (TID) | ORAL | 3 refills | Status: AC | PRN

## 2017-05-20 MED ORDER — APIXABAN 5 MG PO TABS: 5.0000 mg | ORAL_TABLET | Freq: Two times a day (BID) | ORAL | 5 refills | Status: DC

## 2017-05-20 MED ORDER — BENZONATATE 100 MG PO CAPS: 100.0000 mg | ORAL_CAPSULE | Freq: Three times a day (TID) | ORAL | 3 refills | Status: DC | PRN

## 2017-05-20 MED ORDER — APIXABAN 5 MG PO TABS: 10.0000 mg | ORAL_TABLET | Freq: Two times a day (BID) | ORAL | 0 refills | Status: DC

## 2017-05-20 MED ORDER — APIXABAN 5 MG PO TABS: 5.0000 mg | ORAL_TABLET | Freq: Two times a day (BID) | ORAL | Status: DC

## 2017-05-20 MED ORDER — ALBUTEROL SULFATE HFA 108 (90 BASE) MCG/ACT IN AERS: 2.0000 | INHALATION_SPRAY | Freq: Four times a day (QID) | RESPIRATORY_TRACT | 2 refills | Status: AC | PRN

## 2017-05-20 MED ORDER — PREDNISONE 10 MG PO TABS: ORAL_TABLET | ORAL | 0 refills | Status: DC

## 2017-05-20 MED ORDER — ENOXAPARIN SODIUM 60 MG/0.6ML ~~LOC~~ SOLN: 1.0000 mg/kg | SUBCUTANEOUS | 6 refills | Status: DC

## 2017-05-20 MED ORDER — FUROSEMIDE 40 MG PO TABS: 40.0000 mg | ORAL_TABLET | Freq: Every day | ORAL | 0 refills | Status: DC

## 2017-05-20 MED ORDER — APIXABAN 2.5 MG PO TABS: 2.5000 mg | ORAL_TABLET | Freq: Two times a day (BID) | ORAL | 5 refills | Status: DC

## 2017-05-20 MED ORDER — CLONAZEPAM 0.5 MG PO TABS: 0.7500 mg | ORAL_TABLET | Freq: Every day | ORAL | 0 refills | Status: DC

## 2017-05-20 MED ORDER — APIXABAN 2.5 MG PO TABS: 2.5000 mg | ORAL_TABLET | Freq: Two times a day (BID) | ORAL | Status: DC

## 2017-05-20 MED ORDER — APIXABAN 5 MG PO TABS
10.0000 mg | ORAL_TABLET | Freq: Two times a day (BID) | ORAL | Status: DC
  Administered 2017-05-20: 10 mg via ORAL
  Filled 2017-05-20: qty 2

## 2017-05-20 NOTE — Progress Notes (Signed)
Patient will DC to: [facility] Anticipated DC date: 05/20/17 Family notified: Son Transport by: Son by car, oxygen to be delivered to room. Please make sure DNR goes with patient.    Per MD patient ready for DC to [place] [facility]. RN, patient, patient's family, and facility notified of DC. Discharge Summary and FL2 sent to facility. RN given number for report [phone]).  CSW signing off.  Cedric Fishman, LCSW Clinical Social Worker [phone]

## 2017-05-20 NOTE — NC FL2 (Signed)
[place] LEVEL OF CARE SCREENING TOOL     IDENTIFICATION  Patient Name: Kristin Peterson Birthdate: [birth year] Sex: female Admission Date (Current Location): 05/13/2017  [facility] and [place] Number:  Herbalist and Address:  [facility], [place] [address] [place], [place] [address]      Provider Number: [id number]  Attending Physician Name and Address:  Mendel Corning, MD  Relative Name and Phone Number:  Shanon Brow, son, [phone]    Current Level of Care: Hospital Recommended Level of Care: [place] Prior Approval Number:    Date Approved/Denied:   PASRR Number:    Discharge Plan: Other (Comment)(ALF)    Current Diagnoses: Patient Active Problem List   Diagnosis Date Noted  . Goals of care, counseling/discussion   . Palliative care by specialist   . Acute respiratory failure with hypoxia ([place]) 05/14/2017  . CAP (community acquired pneumonia) 05/14/2017  . CAD (coronary artery disease) 05/14/2017  . PAF (paroxysmal atrial fibrillation) ([place]) 05/14/2017  . CKD (chronic kidney disease) stage 3, GFR 30-59 ml/min (HCC) 05/14/2017  . Chronic obstructive pulmonary disease ([place]) 09/27/2016  . Stage IV squamous cell carcinoma of left lung ([place]) 09/25/2016    Orientation RESPIRATION BLADDER Height & Weight     Self, Time  (Continous 2L) Continent Weight: 46.3 kg (102 lb) Height:  5\' 5"  (165.1 cm)  BEHAVIORAL SYMPTOMS/MOOD NEUROLOGICAL BOWEL NUTRITION STATUS      Continent (Heart Healthy)  AMBULATORY STATUS COMMUNICATION OF NEEDS Skin   Extensive Assist Verbally Normal                       Personal Care Assistance Level of Assistance  Bathing, Feeding, Dressing Bathing Assistance: Maximum assistance Feeding assistance: Limited assistance Dressing Assistance: Maximum assistance     Functional Limitations Info             SPECIAL CARE FACTORS FREQUENCY  PT (By licensed PT)     PT Frequency:  home health 3x/week              Contractures Contractures Info: Not present    Additional Factors Info  Code Status, Allergies, Psychotropic Code Status: DNR Allergies Info: Sulfa Antibiotics, Contrast Media Iodinated Diagnostic Agents Psychotropic Info: Zoloft; Klonopin         Current Medications (05/20/2017):   Discharge Medications: TAKE these medications   acetaminophen 650 MG CR tablet Commonly known as:  TYLENOL Take 650 mg by mouth every 6 (six) hours.   albuterol 108 (90 Base) MCG/ACT inhaler Commonly known as:  PROVENTIL HFA;VENTOLIN HFA Inhale 2 puffs into the lungs every 6 (six) hours as needed for wheezing or shortness of breath.   apixaban 2.5 MG Tabs tablet Commonly known as:  ELIQUIS Take 1 tablet (2.5 mg total) by mouth 2 (two) times daily. X 40months   aspirin EC 81 MG tablet Take 1 tablet (81 mg total) by mouth daily.   atorvastatin 40 MG tablet Commonly known as:  LIPITOR Take 1 tablet (40 mg total) by mouth daily.   benzonatate 100 MG capsule Commonly known as:  TESSALON Take 1 capsule (100 mg total) by mouth 3 (three) times daily as needed for cough.   budesonide-formoterol 160-4.5 MCG/ACT inhaler Commonly known as:  SYMBICORT Inhale 2 puffs into the lungs 2 (two) times daily.   clonazePAM 0.5 MG tablet Commonly known as:  KLONOPIN Take 0.75 mg by mouth daily as needed for anxiety. What changed:  Another medication with the same name was changed. Make sure you understand how and when to take each.   clonazePAM 0.5 MG tablet Commonly known as:  KLONOPIN Take 1.5 tablets (0.75 mg total) by mouth at bedtime. And daily as needed for anxiety What changed:  additional instructions   Coenzyme Q-10 200 MG Caps Take 200 mg by mouth 2 (two) times daily.   dofetilide 125 MCG capsule Commonly known as:  TIKOSYN Take 125 mcg by mouth 2 (two) times daily.   furosemide 40 MG tablet Commonly known as:  LASIX Take 40 mg by mouth.    gabapentin 100 MG capsule Commonly known as:  NEURONTIN Take 100 mg by mouth 3 (three) times daily.   guaifenesin 100 MG/5ML syrup Commonly known as:  ROBITUSSIN Take 100 mg by mouth 4 (four) times daily as needed for cough.   hydroxychloroquine 200 MG tablet Commonly known as:  PLAQUENIL Take by mouth daily.   levothyroxine 88 MCG tablet Commonly known as:  SYNTHROID, LEVOTHROID Take 88 mcg by mouth daily before breakfast.   loperamide 2 MG tablet Commonly known as:  IMODIUM A-D Take 2 mg by mouth 4 (four) times daily as needed for diarrhea or loose stools.   loratadine 10 MG tablet Commonly known as:  CLARITIN Take 10 mg by mouth daily.   memantine 10 MG tablet Commonly known as:  NAMENDA Take 10 mg by mouth 2 (two) times daily.   metoprolol tartrate 25 MG tablet Commonly known as:  LOPRESSOR Take 25 mg by mouth daily.   mirtazapine 15 MG tablet Commonly known as:  REMERON Take 7.5 mg by mouth at bedtime.   pantoprazole 40 MG tablet Commonly known as:  PROTONIX Take 40 mg by mouth daily.   polyethylene glycol packet Commonly known as:  MIRALAX / GLYCOLAX Take 17 g by mouth daily as needed for mild constipation.   potassium chloride SA 20 MEQ tablet Commonly known as:  K-DUR,KLOR-CON Take 20 mEq by mouth daily.   predniSONE 5 MG tablet Commonly known as:  DELTASONE Take 5 mg by mouth daily with breakfast. What changed:  Another medication with the same name was added. Make sure you understand how and when to take each.   predniSONE 10 MG tablet Commonly known as:  DELTASONE Prednisone dosing: Take  Prednisone 40mg  (4 tabs) x 2 days, then taper to 30mg  (3 tabs) x 2 days, then 20mg  (2 tabs) x 2 days, then 10mg  (1 tab) x2 days, then continue maintenance dose of prednisone 5 mg daily What changed:  You were already taking a medication with the same name, and this prescription was added. Make sure you understand how and when to take each.    rivastigmine 4.6 mg/24hr Commonly known as:  EXELON Place 4.6 mg onto the skin daily.   sertraline 100 MG tablet Commonly known as:  ZOLOFT Take 100 mg by mouth daily.   sodium chloride 1 g tablet Take 1 g by mouth 2 (two) times daily with a meal.   TANDEM PLUS PO Take 1 tablet by mouth daily.   tiotropium 18 MCG inhalation capsule Commonly known as:  SPIRIVA Place 18 mcg into inhaler and inhale daily.   traMADol 50 MG tablet Commonly known as:  ULTRAM Take 1 tablet (50 mg total) by mouth 2 (two) times daily.       Relevant Imaging Results:  Relevant Lab Results:   Additional Information SSN: [id number]   Palliative care to follow.  Percell Locus  S Koralyn Prestage, LCSW

## 2017-05-20 NOTE — Progress Notes (Signed)
CSW faxed DC Summary and fl2 update to facility. Signed scripts given to RN.   CSW signing off.  Percell Locus Delainey Winstanley LCSW [phone]

## 2017-05-20 NOTE — Discharge Summary (Addendum)
Physician Discharge Summary   Patient ID: Kristin Peterson MRN: [id number] DOB/AGE: February 20, 1934 83 y.o.  Admit date: 05/13/2017 Discharge date: 05/20/2017  Primary Care Physician:  [facility]   Recommendations for Outpatient Follow-up:  1. Follow up with PCP in 1-2 weeks 2. Please obtain BMP/CBC in one week 3.   Palliative medicine to follow at assisted living facility 4.  Please hold prednisone 5 mg maintenance dose daily until patient has completed prednisone taper up to 10 mg.  5.  Patient will need home O2 2L via [place] continuously due to hypoxic respiratory failure 6.  Started on apixaban 10 mg twice a day for acute PE for 7 days.  On 05/27/2017 change apixaban to 5 mg twice a day for total of 6 months.  Patient will need to follow-up with her hematologist/oncologist Dr. Julien Nordmann and adjust anticoagulation dose. 7.  Stop aspirin, fall precautions  Home Health: Home health PT, OT, home health aide Equipment/Devices: None  Discharge Condition: stable  CODE STATUS:  DNR   Diet recommendation: Heart healthy diet   Discharge Diagnoses:    . Acute respiratory failure with hypoxia ([place]) . CAP (community acquired pneumonia) . Stage IV squamous cell carcinoma of left lung ([place]) . Chronic obstructive pulmonary disease with exacerbation (HCC)  Acute pulmonary embolism . PAF (paroxysmal atrial fibrillation) ([place]) . Mild acute on CKD (chronic kidney disease) stage 3, GFR 30-59 ml/min (HCC)  Hypothyroidism  Dementia  Consults:  Palliative medicine Oncology, Dr. Julien Nordmann    Allergies:   Allergies  Allergen Reactions  . Sulfa Antibiotics   . Contrast Media [Iodinated Diagnostic Agents] Itching and Rash     DISCHARGE MEDICATIONS: Allergies as of 05/20/2017      Reactions   Sulfa Antibiotics    Contrast Media [iodinated Diagnostic Agents] Itching, Rash      Medication List    STOP taking these medications   aspirin EC 81 MG tablet     TAKE these medications    acetaminophen 650 MG CR tablet Commonly known as:  TYLENOL Take 650 mg by mouth every 6 (six) hours.   albuterol 108 (90 Base) MCG/ACT inhaler Commonly known as:  PROVENTIL HFA;VENTOLIN HFA Inhale 2 puffs into the lungs every 6 (six) hours as needed for wheezing or shortness of breath.   apixaban 5 MG Tabs tablet Commonly known as:  ELIQUIS Take 2 tablets (10 mg total) by mouth 2 (two) times daily. X 7 days   apixaban 5 MG Tabs tablet Commonly known as:  ELIQUIS Take 1 tablet (5 mg total) by mouth 2 (two) times daily. Start taking on:  05/27/2017   atorvastatin 40 MG tablet Commonly known as:  LIPITOR Take 1 tablet (40 mg total) by mouth daily.   benzonatate 100 MG capsule Commonly known as:  TESSALON Take 1 capsule (100 mg total) by mouth 3 (three) times daily as needed for cough.   budesonide-formoterol 160-4.5 MCG/ACT inhaler Commonly known as:  SYMBICORT Inhale 2 puffs into the lungs 2 (two) times daily.   clonazePAM 0.5 MG tablet Commonly known as:  KLONOPIN Take 0.75 mg by mouth daily as needed for anxiety. What changed:  Another medication with the same name was changed. Make sure you understand how and when to take each.   clonazePAM 0.5 MG tablet Commonly known as:  KLONOPIN Take 1.5 tablets (0.75 mg total) by mouth at bedtime. And daily as needed for anxiety What changed:  additional instructions   Coenzyme Q-10 200 MG Caps Take 200  mg by mouth 2 (two) times daily.   dofetilide 125 MCG capsule Commonly known as:  TIKOSYN Take 125 mcg by mouth 2 (two) times daily.   furosemide 40 MG tablet Commonly known as:  LASIX Take 1 tablet (40 mg total) by mouth daily. What changed:  when to take this   gabapentin 100 MG capsule Commonly known as:  NEURONTIN Take 100 mg by mouth 3 (three) times daily.   guaifenesin 100 MG/5ML syrup Commonly known as:  ROBITUSSIN Take 100 mg by mouth 4 (four) times daily as needed for cough.   hydroxychloroquine 200 MG  tablet Commonly known as:  PLAQUENIL Take by mouth daily.   levothyroxine 88 MCG tablet Commonly known as:  SYNTHROID, LEVOTHROID Take 88 mcg by mouth daily before breakfast.   loperamide 2 MG tablet Commonly known as:  IMODIUM A-D Take 2 mg by mouth 4 (four) times daily as needed for diarrhea or loose stools.   loratadine 10 MG tablet Commonly known as:  CLARITIN Take 10 mg by mouth daily.   memantine 10 MG tablet Commonly known as:  NAMENDA Take 10 mg by mouth 2 (two) times daily.   metoprolol tartrate 25 MG tablet Commonly known as:  LOPRESSOR Take 25 mg by mouth daily.   mirtazapine 15 MG tablet Commonly known as:  REMERON Take 7.5 mg by mouth at bedtime.   pantoprazole 40 MG tablet Commonly known as:  PROTONIX Take 40 mg by mouth daily.   polyethylene glycol packet Commonly known as:  MIRALAX / GLYCOLAX Take 17 g by mouth daily as needed for mild constipation.   potassium chloride SA 20 MEQ tablet Commonly known as:  K-DUR,KLOR-CON Take 20 mEq by mouth daily.   predniSONE 5 MG tablet Commonly known as:  DELTASONE Take 5 mg by mouth daily with breakfast. What changed:  Another medication with the same name was added. Make sure you understand how and when to take each.   predniSONE 10 MG tablet Commonly known as:  DELTASONE Prednisone dosing: Take  Prednisone 40mg  (4 tabs) x 2 days, then taper to 30mg  (3 tabs) x 2 days, then 20mg  (2 tabs) x 2 days, then 10mg  (1 tab) x2 days, then continue maintenance dose of prednisone 5 mg daily What changed:  You were already taking a medication with the same name, and this prescription was added. Make sure you understand how and when to take each.   rivastigmine 4.6 mg/24hr Commonly known as:  EXELON Place 4.6 mg onto the skin daily.   sertraline 100 MG tablet Commonly known as:  ZOLOFT Take 100 mg by mouth daily.   sodium chloride 1 g tablet Take 1 g by mouth 2 (two) times daily with a meal.   TANDEM PLUS PO Take 1  tablet by mouth daily.   tiotropium 18 MCG inhalation capsule Commonly known as:  SPIRIVA Place 18 mcg into inhaler and inhale daily.   traMADol 50 MG tablet Commonly known as:  ULTRAM Take 1 tablet (50 mg total) by mouth 2 (two) times daily.            Durable Medical Equipment  (From admission, onward)        Start     Ordered   05/20/17 1244  For home use only DME oxygen  Once    Question Answer Comment  Mode or (Route) Nasal cannula   Liters per Minute 2   Frequency Continuous (stationary and portable oxygen unit needed)   Oxygen conserving device  Yes   Oxygen delivery system Gas      05/20/17 1243       Brief H and P: For complete details please refer to admission H and P, but in brief 83 year old female, resident of [facility], COPD, chronic respiratory failure on home oxygen 2 L/min at bedtime, stage IV squamous cell lung cancer (Dr.Mohamed Mohamed, oncology), dementia, CAD status post CABG, A. fib not on anticoagulation, chronic diastolic CHF, chronic kidney disease, chronic anemia presented to ED with progressive dyspnea of 2 days duration, dry cough and history of exposure to sick contact with similar symptoms at ALF. In the ED noted to be hypoxic. CT chest without contrast findings suggestive of right lower lobe pneumonia. Admitted for further evaluation and management.   Hospital Course:   Acute respiratory failure with hypoxia (HCC) secondary to community-acquired pneumonia/ RLL, underlying history of lung CA -Chest x-ray showed no acute disease however CT chest without contrast showed new patchy nodular airspace opacities right lower lobe consistent with pneumonia. -Blood cultures negative so far, flu panel negative. -Patient was placed on Rocephin, Zithromax however patient on Tikosyn, azithromycin was changed to doxycycline.  Patient has completed 7 days course of antibiotics today.  Acute pulmonary embolism -VQ scan showed peripheral  wedge-shaped perfusion defects in the left lung suspicious for acute PE -Discussed with Dr. Earlie Server on 5/8, patient is high risk candidate for pulmonary embolism due to malignancy.  Recommended, no need to repeat CT angiogram, place on 6 months of Lovenox, will follow outpatient.  Patient unable to continue Lovenox at the facility, currently at ALF where she wants to go back.  Placed on apixaban per her weight and renal function. If patient has any GI bleeding, will need to discontinue apixaban at that point.  Stop aspirin, fall precautions -Appreciate palliative recommendations , recommend palliative to follow at the facility - Started on apixaban 10 mg twice a day for acute PE for 7 days.  On 05/27/2017 change apixaban to 5 mg twice a day for total of 6 months.  Patient will need to follow-up with her hematologist/oncologist Dr. Julien Nordmann and adjust anticoagulation dose.    COPD exacerbation -Wheezing much improved, patient was placed on Brovana, Pulmicort, duo nebs, flutter valve -Completed Rocephin, doxycycline today.  Transition to oral prednisone  -Patient will continue prednisone taper, complete to up to 10 mg, then continue maintenance dose of prednisone 5 mg daily.   Continue Symbicort, Spiriva,-albuterol    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) -Sees Dr. Julien Nordmann outpatient  Hypothyroidism -Continue Synthroid    PAF (paroxysmal atrial fibrillation) (HCC) -Continue metoprolol, Tikosyn -Hold aspirin, patient started on apixaban  Mild acute on CKD (chronic kidney disease) stage 3, GFR 30-59 ml/min (HCC) -Creatinine improving, baseline creatinine 1.3.  -  Creatinine on admission 1.6.  -Patient at baseline creatinine 1.3 at the time of discharge.  CAD status post CABG -No chest pain, mild troponin elevation likely due to acute respiratory failure.  Currently on  statin, beta-blocker. - Hold aspirin  Dementia with anxiety -Continue Namenda    Day of  Discharge S: Overall feels better, hoping to get out of the hospital today.  Feeling a bit cold this morning  BP 108/80   Pulse 87   Temp 97.8 F (36.6 C)   Resp 18   Ht 5\' 5"  (1.651 m)   Wt 46.3 kg (102 lb)   SpO2 99%   BMI 16.97 kg/m   Physical Exam: General: Alert and awake  oriented x3 not in any acute distress. HEENT: anicteric sclera, pupils reactive to light and accommodation CVS: S1-S2 clear no murmur rubs or gallops Chest: clear to auscultation bilaterally, no wheezing rales or rhonchi Abdomen: soft nontender, nondistended, normal bowel sounds Extremities: no cyanosis, clubbing or edema noted bilaterally Neuro: Cranial nerves II-XII intact, no focal neurological deficits   The results of significant diagnostics from this hospitalization (including imaging, microbiology, ancillary and laboratory) are listed below for reference.      Procedures/Studies:  Dg Chest 2 View  Result Date: 05/13/2017 CLINICAL DATA:  Shortness of breath.  COPD. EXAM: CHEST - 2 VIEW COMPARISON:  CT chest 03/25/2017. FINDINGS: The heart size and mediastinal contours are within normal limits. Both lungs are free of consolidation or edema. The visualized skeletal structures are unremarkable. Old rib fractures. Prior CABG. Thoracic atherosclerosis. IMPRESSION: COPD.  No active disease. Electronically Signed   By: Staci Righter M.D.   On: 05/13/2017 19:05   Ct Chest Wo Contrast  Result Date: 05/13/2017 CLINICAL DATA:  Shortness of breath. COPD. Stroke [place] cell carcinoma of the LEFT lung. EXAM: CT CHEST WITHOUT CONTRAST TECHNIQUE: Multidetector CT imaging of the chest was performed following the standard protocol without IV contrast. COMPARISON:  CT chest 03/25/2017.  Chest radiograph earlier today. FINDINGS: Cardiovascular: Heart size is within normal limits. No significant pericardial fluid or thickening. Three-vessel coronary atherosclerosis. Previous CABG. Atherosclerotic nonaneurysmal thoracic  aorta. Pulmonary artery caliber is within normal limits. LEFT internal jugular Port-A-Cath terminates at the cavoatrial junction. Mediastinum/Nodes: No discrete thyroid nodules. Unremarkable esophagus. No pathologically enlarged nodes, within limits for detection on noncontrast exam. Lungs/Pleura: No pneumothorax or pleural effusion. Severe centrilobular emphysema. New patchy nodular peribronchial consolidation in the lateral basal segment RIGHT lower lobe. Previous posterior basal infiltrate is improved. Stable mildly thickened apical LEFT upper lobe parenchymal band. Stable bullous lesion medial LEFT upper lobe. Upper Abdomen: No acute findings. Musculoskeletal: Old rib fractures.  Thoracic spondylosis. IMPRESSION: New patchy nodular airspace opacities RIGHT lower lobe lateral basal segment consistent with acute pneumonia. This is difficult to appreciate on the earlier chest radiograph but represents a change from previous CT in March. There is resolution of the previous RIGHT lower lobe posterior basal segment infiltrate. Aortic Atherosclerosis (ICD10-I70.0) and Emphysema (ICD10-J43.9). Electronically Signed   By: Staci Righter M.D.   On: 05/13/2017 22:09   Nm Pulmonary Perfusion  Result Date: 05/14/2017 CLINICAL DATA:  83 year old female with chronic respiratory disease on home oxygen. Stage IV lung cancer. Progressive shortness of breath for 2 days with nonproductive cough. EXAM: NUCLEAR MEDICINE PERFUSION SCAN TECHNIQUE: Perfusion images were obtained in multiple projections after intravenous injection of radiopharmaceutical. RADIOPHARMACEUTICALS:  4.0 mCi Tc48m MAA-IV only (no ventilation agent) COMPARISON:  Chest CT 05/13/2017. FINDINGS: Ventilation: Not performed. Perfusion: Large lung volumes. Gradient like decreased perfusion radiotracer activity in the upper lobes, more so the right. Focal increased radiotracer activity in the right lower lobe. Additionally, there are peripheral wedge-shaped areas of  decreased perfusion activity in the left lateral and upper lung, best demonstrated on the posterior image. IMPRESSION: Perfusion-only imaging which is complicated by chronic lung disease, but peripheral wedge-shaped perfusion defects in the left lung are suspicious for acute pulmonary embolus. Electronically Signed   By: Genevie Ann M.D.   On: 05/14/2017 19:02      LAB RESULTS: Basic Metabolic Panel: Recent Labs  Lab 05/18/17 0420 05/20/17 0618  NA 142 142  K 4.5 4.8  CL 102 103  CO2 31 31  GLUCOSE  123* 101*  BUN 49* 46*  CREATININE 1.58* 1.37*  CALCIUM 9.2 9.1   Liver Function Tests: No results for input(s): AST, ALT, ALKPHOS, BILITOT, PROT, ALBUMIN in the last 168 hours. No results for input(s): LIPASE, AMYLASE in the last 168 hours. No results for input(s): AMMONIA in the last 168 hours. CBC: Recent Labs  Lab 05/13/17 1835  05/16/17 0551 05/19/17 0412  WBC 8.4   < > 12.7* 9.7  NEUTROABS 5.4  --   --   --   HGB 10.8*   < > 10.5* 10.9*  HCT 35.5*   < > 33.2* 35.2*  MCV 100.9*   < > 98.2 98.1  PLT 255   < > 222 229   < > = values in this interval not displayed.   Cardiac Enzymes: Recent Labs  Lab 05/14/17 0507 05/14/17 1204  TROPONINI <0.03 0.03*   BNP: Invalid input(s): POCBNP CBG: No results for input(s): GLUCAP in the last 168 hours.    Disposition and Follow-up: Discharge Instructions    Diet - low sodium heart healthy   Complete by:  As directed    Increase activity slowly   Complete by:  As directed        DISPOSITION: ALF with home health PT, OT, home health aide   DISCHARGE FOLLOW-UP  Contact information for follow-up providers    Curt Bears, MD. Schedule an appointment as soon as possible for a visit in 2 week(s).   Specialty:  Oncology Contact information: [place] [address] [address] [place]. Schedule an appointment as soon as possible for a visit in 2 week(s).    Specialty:  Geriatric Medicine Contact information: [address] [place] [address] [phone]            Contact information for after-discharge care    Destination    [facility] .   Service:  Assisted Living Contact information: [address] [place] [phone]                   Time coordinating discharge:  45 minutes  Signed:   Estill Cotta M.D. [facility] 05/20/2017, 2:31 PM Pager: [phone]

## 2017-05-20 NOTE — Progress Notes (Signed)
SATURATION QUALIFICATIONS: (This note is used to comply with regulatory documentation for home oxygen)  Patient Saturations on 3L Oxygen at Rest = 94%  Patient Saturations on Room Air while Ambulating = 74%  Patient Saturations on 3 Liters of oxygen while Ambulating = 93%  Please briefly explain why patient needs home oxygen: Pt dropped down to 64% while standing up from bed on room air, oxygen put on 3L, O2 sat jumped up to 84% and increased to 93%.

## 2017-05-20 NOTE — NC FL2 (Signed)
[place] LEVEL OF CARE SCREENING TOOL     IDENTIFICATION  Patient Name: Kristin Peterson Birthdate: 20-Jun-1934 Sex: female Admission Date (Current Location): 05/13/2017  [facility] and [place] Number:  Herbalist and Address:  [facility], [place] [address] [place], [place] [address]      Provider Number: [id number]  Attending Physician Name and Address:  Mendel Corning, MD  Relative Name and Phone Number:  Shanon Brow, son, [phone]    Current Level of Care: Hospital Recommended Level of Care: [place] Prior Approval Number:    Date Approved/Denied:   PASRR Number:    Discharge Plan: Other (Comment)(ALF)    Current Diagnoses: Patient Active Problem List   Diagnosis Date Noted  . Goals of care, counseling/discussion   . Palliative care by specialist   . Acute respiratory failure with hypoxia ([place]) 05/14/2017  . CAP (community acquired pneumonia) 05/14/2017  . CAD (coronary artery disease) 05/14/2017  . PAF (paroxysmal atrial fibrillation) ([place]) 05/14/2017  . CKD (chronic kidney disease) stage 3, GFR 30-59 ml/min (HCC) 05/14/2017  . Chronic obstructive pulmonary disease ([place]) 09/27/2016  . Stage IV squamous cell carcinoma of left lung ([place]) 09/25/2016    Orientation RESPIRATION BLADDER Height & Weight     Self, Time  (Continous 2L) Continent Weight: 46.3 kg (102 lb) Height:  5\' 5"  (165.1 cm)  BEHAVIORAL SYMPTOMS/MOOD NEUROLOGICAL BOWEL NUTRITION STATUS      Continent (Heart Healthy)  AMBULATORY STATUS COMMUNICATION OF NEEDS Skin   Extensive Assist Verbally Normal                       Personal Care Assistance Level of Assistance  Bathing, Feeding, Dressing Bathing Assistance: Maximum assistance Feeding assistance: Limited assistance Dressing Assistance: Maximum assistance     Functional Limitations Info             SPECIAL CARE FACTORS FREQUENCY  PT (By licensed PT)     PT Frequency:  home health 3x/week              Contractures Contractures Info: Not present    Additional Factors Info  Code Status, Allergies, Psychotropic Code Status Info: Full Allergies Info: Sulfa Antibiotics, Contrast Media Iodinated Diagnostic Agents Psychotropic Info: Zoloft; Klonopin         Current Medications (05/20/2017):   Discharge Medications: STOP taking these medications          aspirin EC 81 MG tablet                   TAKE these medications          acetaminophen 650 MG CR tablet Commonly known as:  TYLENOL Take 650 mg by mouth every 6 (six) hours.   albuterol 108 (90 Base) MCG/ACT inhaler Commonly known as:  PROVENTIL HFA;VENTOLIN HFA Inhale 2 puffs into the lungs every 6 (six) hours as needed for wheezing or shortness of breath.   apixaban 5 MG Tabs tablet Commonly known as:  ELIQUIS Take 2 tablets (10 mg total) by mouth 2 (two) times daily. X 7 days   apixaban 5 MG Tabs tablet Commonly known as:  ELIQUIS Take 1 tablet (5 mg total) by mouth 2 (two) times daily. Start taking on:  05/27/2017   atorvastatin 40 MG tablet Commonly known as:  LIPITOR Take 1 tablet (40 mg total) by mouth daily.   benzonatate 100 MG capsule Commonly known as:  TESSALON Take 1  capsule (100 mg total) by mouth 3 (three) times daily as needed for cough.   budesonide-formoterol 160-4.5 MCG/ACT inhaler Commonly known as:  SYMBICORT Inhale 2 puffs into the lungs 2 (two) times daily.   clonazePAM 0.5 MG tablet Commonly known as:  KLONOPIN Take 0.75 mg by mouth daily as needed for anxiety. What changed:  Another medication with the same name was changed. Make sure you understand how and when to take each.   clonazePAM 0.5 MG tablet Commonly known as:  KLONOPIN Take 1.5 tablets (0.75 mg total) by mouth at bedtime. And daily as needed for anxiety What changed:  additional instructions   Coenzyme Q-10 200 MG Caps Take 200 mg by mouth 2 (two) times daily.    dofetilide 125 MCG capsule Commonly known as:  TIKOSYN Take 125 mcg by mouth 2 (two) times daily.   furosemide 40 MG tablet Commonly known as:  LASIX Take 40 mg by mouth.   gabapentin 100 MG capsule Commonly known as:  NEURONTIN Take 100 mg by mouth 3 (three) times daily.   guaifenesin 100 MG/5ML syrup Commonly known as:  ROBITUSSIN Take 100 mg by mouth 4 (four) times daily as needed for cough.   hydroxychloroquine 200 MG tablet Commonly known as:  PLAQUENIL Take by mouth daily.   levothyroxine 88 MCG tablet Commonly known as:  SYNTHROID, LEVOTHROID Take 88 mcg by mouth daily before breakfast.   loperamide 2 MG tablet Commonly known as:  IMODIUM A-D Take 2 mg by mouth 4 (four) times daily as needed for diarrhea or loose stools.   loratadine 10 MG tablet Commonly known as:  CLARITIN Take 10 mg by mouth daily.   memantine 10 MG tablet Commonly known as:  NAMENDA Take 10 mg by mouth 2 (two) times daily.   metoprolol tartrate 25 MG tablet Commonly known as:  LOPRESSOR Take 25 mg by mouth daily.   mirtazapine 15 MG tablet Commonly known as:  REMERON Take 7.5 mg by mouth at bedtime.   pantoprazole 40 MG tablet Commonly known as:  PROTONIX Take 40 mg by mouth daily.   polyethylene glycol packet Commonly known as:  MIRALAX / GLYCOLAX Take 17 g by mouth daily as needed for mild constipation.   potassium chloride SA 20 MEQ tablet Commonly known as:  K-DUR,KLOR-CON Take 20 mEq by mouth daily.   predniSONE 5 MG tablet Commonly known as:  DELTASONE Take 5 mg by mouth daily with breakfast. What changed:  Another medication with the same name was added. Make sure you understand how and when to take each.   predniSONE 10 MG tablet Commonly known as:  DELTASONE Prednisone dosing: Take  Prednisone 40mg  (4 tabs) x 2 days, then taper to 30mg  (3 tabs) x 2 days, then 20mg  (2 tabs) x 2 days, then 10mg  (1 tab) x2 days, then continue maintenance dose of  prednisone 5 mg daily What changed:  You were already taking a medication with the same name, and this prescription was added. Make sure you understand how and when to take each.   rivastigmine 4.6 mg/24hr Commonly known as:  EXELON Place 4.6 mg onto the skin daily.   sertraline 100 MG tablet Commonly known as:  ZOLOFT Take 100 mg by mouth daily.   sodium chloride 1 g tablet Take 1 g by mouth 2 (two) times daily with a meal.   TANDEM PLUS PO Take 1 tablet by mouth daily.   tiotropium 18 MCG inhalation capsule Commonly known as:  [place]  18 mcg into inhaler and inhale daily.   traMADol 50 MG tablet Commonly known as:  ULTRAM Take 1 tablet (50 mg total) by mouth 2 (two) times daily.      Relevant Imaging Results:  Relevant Lab Results:   Additional Information SSN: [id number]   Palliative care to follow.  Benard Halsted, LCSWA

## 2017-05-20 NOTE — Progress Notes (Signed)
Kristin Peterson to be D/C'd to [facility] per MD order.  Discussed with the patient and all questions fully answered.  VSS, Skin clean, dry and intact without evidence of skin break down, no evidence of skin tears noted. IV catheter discontinued intact. Site without signs and symptoms of complications. Dressing and pressure applied.  An After Visit Summary was printed and given to the patient. Patient received prescription. Given report to RN receiving pt at [facility].   D/c education completed with patient/family including follow up instructions, medication list, d/c activities limitations if indicated, with other d/c instructions as indicated by MD - patient able to verbalize understanding, all questions fully answered.   Patient instructed to return to ED, call 911, or call MD for any changes in condition.   Patient escorted via WC, and D[facility] with son via private vehicle.  [name] 05/20/2017 2:36 PM

## 2017-05-20 NOTE — Care Management Note (Signed)
Case Management Note  Patient Details  Name: Kristin Peterson MRN: [id number] Date of Birth: 1934/11/24  Subjective/Objective:                    Action/Plan: Pt discharging back to [facility] today. Pt is active with [facility] for oxygen at the facility. CM spoke with Jermaine with [facility] and she has a Civil engineer, contracting at [employer]. Pt was only wearing oxygen at night and currently she qualifies for oxygen during the day. RN placed note for ambulatory sats and MD placed orders for increased use of oxygen. Jermaine with [facility] updated.  Son to transport patient to facility. CM notified Jermaine with [facility] that portable tank needed for the trip. Jermaine delivered the tank to the room.  Pt with orders for [facility] services at [facility]. CSW had information placed on d/c summary per MD and [facility] will arrange these services.   Expected Discharge Date:  05/20/17               Expected Discharge Plan:  Assisted Living / Rest Home  In-House Referral:  Clinical Social Work  Discharge planning Services  CM Consult  Post Acute Care Choice:  Durable Medical Equipment Choice offered to:     DME Arranged:  Oxygen DME Agency:  [place] Arranged:  PT, OT, Nurse's Aide(to be set up through [facility]) [place] Agency:     Status of Service:  Completed, signed off  If discussed at [place] of Stay Meetings, dates discussed:    Additional Comments:  Pollie Friar, RN 05/20/2017, 2:22 PM

## 2017-05-21 ENCOUNTER — Telehealth: Payer: Self-pay | Admitting: Internal Medicine

## 2017-05-21 NOTE — Telephone Encounter (Signed)
2 weeks is ok but we can see her sooner if we have any openings.

## 2017-05-21 NOTE — Care Management Important Message (Signed)
Important Message  Patient Details  Name: Kristin Peterson MRN: [id number] Date of Birth: 1934-04-24   Medicare Important Message Given:  Yes    Tyffany Waldrop Montine [address] 05/21/2017, 7:13 AM

## 2017-05-21 NOTE — Telephone Encounter (Signed)
Patient's son called today requesting a sooner appointment with MM. Per son patient has been released from [facility] for pneumonia/blood and possible recurrence. Per son patient needs to be seen within 2 weeks of d/c date. Son Thomes Dinning can be reached at [phone]. Message routed to MM/desk nurse/navigator.

## 2017-05-22 ENCOUNTER — Telehealth: Payer: Self-pay | Admitting: Internal Medicine

## 2017-05-22 NOTE — Telephone Encounter (Signed)
Son is aware of appt date and time per 5/15 sch message.

## 2017-05-23 ENCOUNTER — Encounter: Payer: Medicare Other | Admitting: Hospice and Palliative Medicine

## 2017-05-24 ENCOUNTER — Encounter: Payer: Medicare Other | Admitting: Hospice and Palliative Medicine

## 2017-05-27 ENCOUNTER — Non-Acute Institutional Stay: Payer: Medicare Other | Admitting: Hospice and Palliative Medicine

## 2017-05-27 ENCOUNTER — Inpatient Hospital Stay: Payer: Medicare Other | Attending: Internal Medicine | Admitting: Internal Medicine

## 2017-05-27 ENCOUNTER — Encounter: Payer: Self-pay | Admitting: Internal Medicine

## 2017-05-27 VITALS — BP 111/62 | HR 71 | Temp 98.3°F | Resp 18 | Ht 65.0 in | Wt 105.3 lb

## 2017-05-27 DIAGNOSIS — I2699 Other pulmonary embolism without acute cor pulmonale: Secondary | ICD-10-CM | POA: Diagnosis not present

## 2017-05-27 DIAGNOSIS — F0390 Unspecified dementia without behavioral disturbance: Secondary | ICD-10-CM | POA: Insufficient documentation

## 2017-05-27 DIAGNOSIS — F418 Other specified anxiety disorders: Secondary | ICD-10-CM | POA: Insufficient documentation

## 2017-05-27 DIAGNOSIS — I2510 Atherosclerotic heart disease of native coronary artery without angina pectoris: Secondary | ICD-10-CM | POA: Diagnosis not present

## 2017-05-27 DIAGNOSIS — Z9981 Dependence on supplemental oxygen: Secondary | ICD-10-CM | POA: Insufficient documentation

## 2017-05-27 DIAGNOSIS — I252 Old myocardial infarction: Secondary | ICD-10-CM | POA: Insufficient documentation

## 2017-05-27 DIAGNOSIS — R918 Other nonspecific abnormal finding of lung field: Secondary | ICD-10-CM | POA: Diagnosis not present

## 2017-05-27 DIAGNOSIS — C3492 Malignant neoplasm of unspecified part of left bronchus or lung: Secondary | ICD-10-CM

## 2017-05-27 DIAGNOSIS — J449 Chronic obstructive pulmonary disease, unspecified: Secondary | ICD-10-CM | POA: Diagnosis not present

## 2017-05-27 DIAGNOSIS — Z85118 Personal history of other malignant neoplasm of bronchus and lung: Secondary | ICD-10-CM

## 2017-05-27 DIAGNOSIS — Z515 Encounter for palliative care: Secondary | ICD-10-CM

## 2017-05-27 DIAGNOSIS — Z86711 Personal history of pulmonary embolism: Secondary | ICD-10-CM | POA: Insufficient documentation

## 2017-05-27 DIAGNOSIS — Z7901 Long term (current) use of anticoagulants: Secondary | ICD-10-CM | POA: Diagnosis not present

## 2017-05-27 DIAGNOSIS — Z79899 Other long term (current) drug therapy: Secondary | ICD-10-CM | POA: Insufficient documentation

## 2017-05-27 NOTE — Progress Notes (Signed)
PALLIATIVE CARE CONSULT VISIT   PATIENT NAME: Kristin Peterson DOB: 06-08-1934 MRN: [id number]  PRIMARY CARE PROVIDER: Orvis Brill, [facility]  REFERRING PROVIDER: [facility] [place] [address], [place] [address]  RESPONSIBLE PARTY:   Hulan Fess [phone]   RECOMMENDATIONS and PLAN:  1. 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 She had a fairly recent hospitalization for pna and PE. She continues on oxygen. Her primary NP has already ordered [facility] PT/OT. She has follow up appointment with oncology to determine if any progression of her cancer. She is in the monitoring stage. No further recommendations to make. She uses the [facility] and transfers independently into it from her recliner. 2. Memory loss: she continues on Namenda 10 mg BID as well as the Exalon patch. She remains in th 6's on the FAST scale. No recorded weight loss. She speaks in full sentences...transfers with some unsteadiness into the WC. She is non ambulatory. She still feeds herself and swallows without difficulty. She can hold a simple conversation. No details.   3. High fall risk: she remains high fall risk. She is now on anticoagulation. As above, is getting [facility] PT/OT for muscle strengthening. I do not have any recommendations to make. She needs close safety monitoring.  4. ACP: she has a DNR form on the chart. Scheduling a meeting with son to determine overall goals of care and to complete the MOST form.   I spent 30 minutes providing this consultation,  from 11:30am to 12:00 pm. More than 50% of the time in this consultation was spent coordinating communication.   HISTORY OF PRESENT ILLNESS:  Kristin Peterson is a 83 y.o.  female with multiple medical problems including recent hospitalization for PE/pna, ongoing COPD and previous hx of lung CA.  Palliative Care was asked to help address symptom management and goals of care.   CODE STATUS: DNR  PPS: 30% HOSPICE ELIGIBILITY/DIAGNOSIS: TBD  PAST MEDICAL HISTORY:  Past Medical History:  Diagnosis Date  . Allergy   . Anxiety   . Arthritis   . CAD (coronary artery disease)   . Cataract   . COPD (chronic obstructive pulmonary disease) ([place])   . Dementia   . Depression   . Myocardial infarction ([place])   . Stage IV squamous cell carcinoma of left lung ([place]) 09/25/2016    SOCIAL HX:  Social History   Tobacco Use  . Smoking status: Former Smoker    Packs/day: 1.00    Years: 40.00    Pack years: 40.00    Types: Cigarettes    Last attempt to quit: 03/22/1996    Years since quitting: 21.1  . Smokeless tobacco: Never Used  Substance Use Topics  . Alcohol use: No    ALLERGIES:  Allergies  Allergen Reactions  . Sulfa Antibiotics   . Contrast Media [Iodinated Diagnostic Agents] Itching and Rash     PERTINENT MEDICATIONS:  Outpatient Encounter Medications as of 05/27/2017  Medication Sig  . acetaminophen (TYLENOL) 650 MG CR tablet Take 650 mg by mouth every 6 (six) hours.   Marland Kitchen albuterol (PROVENTIL HFA;VENTOLIN HFA) 108 (90 Base) MCG/ACT inhaler Inhale 2 puffs into the lungs every 6 (six) hours as needed for wheezing or shortness of breath.  Marland Kitchen apixaban (ELIQUIS) 5 MG TABS tablet Take 2 tablets (10 mg total) by mouth 2 (two) times  daily. X 7 days  . apixaban (ELIQUIS) 5 MG TABS tablet Take 1 tablet (5 mg total) by mouth 2 (two) times daily. (Patient not taking: Reported on 05/27/2017)  . atorvastatin (LIPITOR) 40 MG tablet Take 1 tablet (40 mg total) by mouth daily.  . benzonatate (TESSALON) 100 MG capsule Take 1 capsule (100 mg total) by mouth 3 (three) times daily as needed for cough.  . budesonide-formoterol (SYMBICORT) 160-4.5 MCG/ACT inhaler Inhale 2 puffs into the lungs 2 (two) times daily.  . clonazePAM (KLONOPIN) 0.5 MG tablet Take 1.5 tablets (0.75 mg total) by mouth at  bedtime. And daily as needed for anxiety  . Coenzyme Q-10 200 MG CAPS Take 200 mg by mouth 2 (two) times daily.   Marland Kitchen dofetilide (TIKOSYN) 125 MCG capsule Take 125 mcg by mouth 2 (two) times daily.  Marland Kitchen FeFum-FePo-FA-B Cmp-C-Zn-Mn-Cu (TANDEM PLUS PO) Take 1 tablet by mouth daily.  . furosemide (LASIX) 40 MG tablet Take 1 tablet (40 mg total) by mouth daily.  Marland Kitchen gabapentin (NEURONTIN) 100 MG capsule Take 100 mg by mouth 3 (three) times daily.  Marland Kitchen guaifenesin (ROBITUSSIN) 100 MG/5ML syrup Take 100 mg by mouth 4 (four) times daily as needed for cough.  . hydroxychloroquine (PLAQUENIL) 200 MG tablet Take by mouth daily.  Marland Kitchen levothyroxine (SYNTHROID, LEVOTHROID) 88 MCG tablet Take 88 mcg by mouth daily before breakfast.  . loperamide (IMODIUM A-D) 2 MG tablet Take 2 mg by mouth 4 (four) times daily as needed for diarrhea or loose stools.  Marland Kitchen loratadine (CLARITIN) 10 MG tablet Take 10 mg by mouth daily.  . memantine (NAMENDA) 10 MG tablet Take 10 mg by mouth 2 (two) times daily.  . metoprolol tartrate (LOPRESSOR) 25 MG tablet Take 25 mg by mouth daily.   . mirtazapine (REMERON) 15 MG tablet Take 7.5 mg by mouth at bedtime.   . pantoprazole (PROTONIX) 40 MG tablet Take 40 mg by mouth daily.  . polyethylene glycol (MIRALAX / GLYCOLAX) packet Take 17 g by mouth daily as needed for mild constipation.  . potassium chloride SA (K-DUR,KLOR-CON) 20 MEQ tablet Take 20 mEq by mouth daily.   . predniSONE (DELTASONE) 10 MG tablet Prednisone dosing: Take  Prednisone 40mg  (4 tabs) x 2 days, then taper to 30mg  (3 tabs) x 2 days, then 20mg  (2 tabs) x 2 days, then 10mg  (1 tab) x2 days, then continue maintenance dose of prednisone 5 mg daily  . rivastigmine (EXELON) 4.6 mg/24hr Place 4.6 mg onto the skin daily.  . sertraline (ZOLOFT) 100 MG tablet Take 100 mg by mouth daily.  . sodium chloride 1 g tablet Take 1 g by mouth 2 (two) times daily with a meal.  . tiotropium (SPIRIVA) 18 MCG inhalation capsule Place 18 mcg into  inhaler and inhale daily.  . traMADol (ULTRAM) 50 MG tablet Take 1 tablet (50 mg total) by mouth 2 (two) times daily.   No facility-administered encounter medications on file as of 05/27/2017.     PHYSICAL EXAM:   General: Frail, elderly Caucasian female sitting in recliner wearing oxygen in NAD Cardiovascular: irreg rhythm; reg rate Pulmonary: diminished in bases; oxygen at 2 liters continuous Abdomen: soft, active BS, NTTP Extremities: legs are weak on standing/transferring. No edema Skin: thin, easily bruised skin Neurological: alert; oriented to person and place; forgetful; word searches; + generalized weakness; + tremors  Nathanial Rancher, NP

## 2017-05-27 NOTE — Progress Notes (Signed)
[place] Telephone:([phone]   Fax:([phone]  OFFICE PROGRESS NOTE  [facility] [place] [address]  DIAGNOSIS: Stage IV non-small cell lung cancer presented with bilateral pulmonary nodules in addition to left hilar and mediastinal lymphadenopathy diagnosed in October 2015  PRIOR THERAPY: Status post 11 cycles of treatment with Nivolumab discontinued in May 2016 secondary to intolerance but the patient had good response to this treatment at that time.  CURRENT THERAPY: Observation for the last few years. She has significant dementia.  INTERVAL HISTORY: Kristin Peterson 83 y.o. female returns to the clinic today for hospital follow-up visit.  The patient was admitted to [facility] with questionable pneumonia.  She was treated with a course of antibiotics and she is feeling much better today.  She denied having any chest pain, shortness of breath but continues to have mild cough with no hemoptysis.  She denied having any fever or chills.  She has no weight loss or night sweats.  She had CT scan of the chest during her hospitalization that showed suspicious pneumonia on the right lower lobe.  She was also diagnosed with acute pulmonary embolism and started on Eliquis 5 mg p.o. twice daily.  MEDICAL HISTORY: Past Medical History:  Diagnosis Date  . Allergy   . Anxiety   . Arthritis   . CAD (coronary artery disease)   . Cataract   . COPD (chronic obstructive pulmonary disease) ([place])   . Dementia   . Depression   . Myocardial infarction ([place])   . Stage IV squamous cell carcinoma of left lung ([place]) 09/25/2016    ALLERGIES:  is allergic to sulfa antibiotics and contrast media [iodinated diagnostic agents].  MEDICATIONS:  Current Outpatient Medications  Medication Sig Dispense Refill  . acetaminophen (TYLENOL) 650 MG CR tablet Take 650 mg by mouth every 6 (six) hours.     Marland Kitchen albuterol (PROVENTIL  HFA;VENTOLIN HFA) 108 (90 Base) MCG/ACT inhaler Inhale 2 puffs into the lungs every 6 (six) hours as needed for wheezing or shortness of breath. 1 Inhaler 2  . apixaban (ELIQUIS) 5 MG TABS tablet Take 2 tablets (10 mg total) by mouth 2 (two) times daily. X 7 days 28 tablet 0  . apixaban (ELIQUIS) 5 MG TABS tablet Take 1 tablet (5 mg total) by mouth 2 (two) times daily. 60 tablet 5  . atorvastatin (LIPITOR) 40 MG tablet Take 1 tablet (40 mg total) by mouth daily. 90 tablet 3  . benzonatate (TESSALON) 100 MG capsule Take 1 capsule (100 mg total) by mouth 3 (three) times daily as needed for cough. 30 capsule 3  . budesonide-formoterol (SYMBICORT) 160-4.5 MCG/ACT inhaler Inhale 2 puffs into the lungs 2 (two) times daily.    . clonazePAM (KLONOPIN) 0.5 MG tablet Take 0.75 mg by mouth daily as needed for anxiety.    . clonazePAM (KLONOPIN) 0.5 MG tablet Take 1.5 tablets (0.75 mg total) by mouth at bedtime. And daily as needed for anxiety 10 tablet 0  . Coenzyme Q-10 200 MG CAPS Take 200 mg by mouth 2 (two) times daily.     Marland Kitchen dofetilide (TIKOSYN) 125 MCG capsule Take 125 mcg by mouth 2 (two) times daily.    Marland Kitchen FeFum-FePo-FA-B Cmp-C-Zn-Mn-Cu (TANDEM PLUS PO) Take 1 tablet by mouth daily.    . furosemide (LASIX) 40 MG tablet Take 1 tablet (40 mg total) by mouth daily. 30 tablet 0  . gabapentin (NEURONTIN) 100  MG capsule Take 100 mg by mouth 3 (three) times daily.    Marland Kitchen guaifenesin (ROBITUSSIN) 100 MG/5ML syrup Take 100 mg by mouth 4 (four) times daily as needed for cough.    . hydroxychloroquine (PLAQUENIL) 200 MG tablet Take by mouth daily.    Marland Kitchen levothyroxine (SYNTHROID, LEVOTHROID) 88 MCG tablet Take 88 mcg by mouth daily before breakfast.    . loperamide (IMODIUM A-D) 2 MG tablet Take 2 mg by mouth 4 (four) times daily as needed for diarrhea or loose stools.    Marland Kitchen loratadine (CLARITIN) 10 MG tablet Take 10 mg by mouth daily.    . memantine (NAMENDA) 10 MG tablet Take 10 mg by mouth 2 (two) times daily.      . metoprolol tartrate (LOPRESSOR) 25 MG tablet Take 25 mg by mouth daily.     . mirtazapine (REMERON) 15 MG tablet Take 7.5 mg by mouth at bedtime.     . pantoprazole (PROTONIX) 40 MG tablet Take 40 mg by mouth daily.    . polyethylene glycol (MIRALAX / GLYCOLAX) packet Take 17 g by mouth daily as needed for mild constipation.    . potassium chloride SA (K-DUR,KLOR-CON) 20 MEQ tablet Take 20 mEq by mouth daily.     . predniSONE (DELTASONE) 10 MG tablet Prednisone dosing: Take  Prednisone 40mg  (4 tabs) x 2 days, then taper to 30mg  (3 tabs) x 2 days, then 20mg  (2 tabs) x 2 days, then 10mg  (1 tab) x2 days, then continue maintenance dose of prednisone 5 mg daily 21 tablet 0  . predniSONE (DELTASONE) 5 MG tablet Take 5 mg by mouth daily with breakfast.    . rivastigmine (EXELON) 4.6 mg/24hr Place 4.6 mg onto the skin daily.    . sertraline (ZOLOFT) 100 MG tablet Take 100 mg by mouth daily.    . sodium chloride 1 g tablet Take 1 g by mouth 2 (two) times daily with a meal.    . tiotropium (SPIRIVA) 18 MCG inhalation capsule Place 18 mcg into inhaler and inhale daily.    . traMADol (ULTRAM) 50 MG tablet Take 1 tablet (50 mg total) by mouth 2 (two) times daily. 10 tablet 0   No current facility-administered medications for this visit.     SURGICAL HISTORY:  Past Surgical History:  Procedure Laterality Date  . ABDOMINAL HYSTERECTOMY    . CORONARY ARTERY BYPASS GRAFT    . OOPHORECTOMY    . TONSILLECTOMY      REVIEW OF SYSTEMS:  A comprehensive review of systems was negative except for: Respiratory: positive for cough and dyspnea on exertion   PHYSICAL EXAMINATION: General appearance: alert, cooperative and no distress Head: Normocephalic, without obvious abnormality, atraumatic Neck: no adenopathy, no JVD, supple, symmetrical, trachea midline and thyroid not enlarged, symmetric, no tenderness/mass/nodules Lymph nodes: Cervical, supraclavicular, and axillary nodes normal. Resp: clear to  auscultation bilaterally Back: symmetric, no curvature. ROM normal. No CVA tenderness. Cardio: regular rate and rhythm, S1, S2 normal, no murmur, click, rub or gallop GI: soft, non-tender; bowel sounds normal; no masses,  no organomegaly Extremities: extremities normal, atraumatic, no cyanosis or edema  ECOG PERFORMANCE STATUS: 1 - Symptomatic but completely ambulatory  Blood pressure 111/62, pulse 71, temperature 98.3 F (36.8 C), temperature source Oral, resp. rate 18, height 5\' 5"  (1.651 m), weight 105 lb 4.8 oz (47.8 kg), SpO2 98 %.  LABORATORY DATA: Lab Results  Component Value Date   WBC 9.7 05/19/2017   HGB 10.9 (L) 05/19/2017   HCT  35.2 (L) 05/19/2017   MCV 98.1 05/19/2017   PLT 229 05/19/2017      Chemistry      Component Value Date/Time   NA 142 05/20/2017 0618   K 4.8 05/20/2017 0618   CL 103 05/20/2017 0618   CO2 31 05/20/2017 0618   BUN 46 (H) 05/20/2017 0618   CREATININE 1.37 (H) 05/20/2017 0618      Component Value Date/Time   CALCIUM 9.1 05/20/2017 0618   ALKPHOS 63 03/25/2017 1028   AST 20 03/25/2017 1028   ALT 20 03/25/2017 1028   BILITOT 0.3 03/25/2017 1028   BILITOT 0.4 10/31/2016 0852       RADIOGRAPHIC STUDIES: Dg Chest 2 View  Result Date: 05/13/2017 CLINICAL DATA:  Shortness of breath.  COPD. EXAM: CHEST - 2 VIEW COMPARISON:  CT chest 03/25/2017. FINDINGS: The heart size and mediastinal contours are within normal limits. Both lungs are free of consolidation or edema. The visualized skeletal structures are unremarkable. Old rib fractures. Prior CABG. Thoracic atherosclerosis. IMPRESSION: COPD.  No active disease. Electronically Signed   By: Staci Righter M.D.   On: 05/13/2017 19:05   Ct Chest Wo Contrast  Result Date: 05/13/2017 CLINICAL DATA:  Shortness of breath. COPD. Stroke [place] cell carcinoma of the LEFT lung. EXAM: CT CHEST WITHOUT CONTRAST TECHNIQUE: Multidetector CT imaging of the chest was performed following the standard protocol  without IV contrast. COMPARISON:  CT chest 03/25/2017.  Chest radiograph earlier today. FINDINGS: Cardiovascular: Heart size is within normal limits. No significant pericardial fluid or thickening. Three-vessel coronary atherosclerosis. Previous CABG. Atherosclerotic nonaneurysmal thoracic aorta. Pulmonary artery caliber is within normal limits. LEFT internal jugular Port-A-Cath terminates at the cavoatrial junction. Mediastinum/Nodes: No discrete thyroid nodules. Unremarkable esophagus. No pathologically enlarged nodes, within limits for detection on noncontrast exam. Lungs/Pleura: No pneumothorax or pleural effusion. Severe centrilobular emphysema. New patchy nodular peribronchial consolidation in the lateral basal segment RIGHT lower lobe. Previous posterior basal infiltrate is improved. Stable mildly thickened apical LEFT upper lobe parenchymal band. Stable bullous lesion medial LEFT upper lobe. Upper Abdomen: No acute findings. Musculoskeletal: Old rib fractures.  Thoracic spondylosis. IMPRESSION: New patchy nodular airspace opacities RIGHT lower lobe lateral basal segment consistent with acute pneumonia. This is difficult to appreciate on the earlier chest radiograph but represents a change from previous CT in March. There is resolution of the previous RIGHT lower lobe posterior basal segment infiltrate. Aortic Atherosclerosis (ICD10-I70.0) and Emphysema (ICD10-J43.9). Electronically Signed   By: Staci Righter M.D.   On: 05/13/2017 22:09   Nm Pulmonary Perfusion  Result Date: 05/14/2017 CLINICAL DATA:  83 year old female with chronic respiratory disease on home oxygen. Stage IV lung cancer. Progressive shortness of breath for 2 days with nonproductive cough. EXAM: NUCLEAR MEDICINE PERFUSION SCAN TECHNIQUE: Perfusion images were obtained in multiple projections after intravenous injection of radiopharmaceutical. RADIOPHARMACEUTICALS:  4.0 mCi Tc30m MAA-IV only (no ventilation agent) COMPARISON:  Chest CT  05/13/2017. FINDINGS: Ventilation: Not performed. Perfusion: Large lung volumes. Gradient like decreased perfusion radiotracer activity in the upper lobes, more so the right. Focal increased radiotracer activity in the right lower lobe. Additionally, there are peripheral wedge-shaped areas of decreased perfusion activity in the left lateral and upper lung, best demonstrated on the posterior image. IMPRESSION: Perfusion-only imaging which is complicated by chronic lung disease, but peripheral wedge-shaped perfusion defects in the left lung are suspicious for acute pulmonary embolus. Electronically Signed   By: Genevie Ann M.D.   On: 05/14/2017 19:02    ASSESSMENT AND  PLAN: This is a very pleasant 83 years old white female with a stage IV non-small cell lung cancer status post treatment with Nivolumab for 11 cycles discontinued in May 2016 secondary to intolerance but the patient had good response to the treatment at that time.  She is currently on observation. The recent CT scan of the chest performed during her hospitalization showed persistent airspace disease in the right lower lobe suspicious for pneumonia.  She also has concerning findings for pulmonary embolism and she was a started on Eliquis 5 mg p.o. twice daily. I discussed the scan results with the patient and her son and recommended for her to continue on observation.  She will have repeat CT scan of the chest next month for reevaluation and to make sure that she has resolution of her pneumonia. She was advised to call immediately if she has any concerning symptoms in the interval. The patient voices understanding of current disease status and treatment options and is in agreement with the current care plan. All questions were answered. The patient knows to call the clinic with any problems, questions or concerns. We can certainly see the patient much sooner if necessary.  I spent 10 minutes counseling the patient face to face. The total time spent  in the appointment was 15 minutes.  Disclaimer: This note was dictated with voice recognition software. Similar sounding words can inadvertently be transcribed and may not be corrected upon review.

## 2017-05-28 ENCOUNTER — Encounter: Payer: Medicare Other | Admitting: Hospice and Palliative Medicine

## 2017-05-29 ENCOUNTER — Telehealth: Payer: Self-pay

## 2017-05-29 NOTE — Telephone Encounter (Signed)
Message left for patient's son to reschedule meeting with Rodena Piety NP.

## 2017-05-29 NOTE — Progress Notes (Signed)
HPI: FU atrial fibrillation. Patient also had coronary artery bypass graft in 2001. Nuclear study 2015 showed no evidence of infarct or ischemia and ejection fraction 68%. Echocardiogram March 2018 showed ejection fraction 55-60% and grade 1 diastolic dysfunction. Mild left atrial enlargement. Trace aortic and mitral regurgitation. Based on outside notes she has a history of GI bleed on xarelto. She also was on tikosyn previously for atrial fibrillation. At previous office visit we decided risk of anticoagulation outweighed benefit given her frail body habitus and mild dementia.  Patient is being treated for stage IV non-small cell lung cancer.  She is now on apixaban for pulmonary embolus.  Recently hospitalized for pneumonia.  Since last seen patient denies dyspnea, chest pain, palpitations or syncope.  No bleeding.  She is now on oxygen.  Current Outpatient Medications  Medication Sig Dispense Refill  . acetaminophen (TYLENOL) 650 MG CR tablet Take 650 mg by mouth every 6 (six) hours.     Marland Kitchen albuterol (PROVENTIL HFA;VENTOLIN HFA) 108 (90 Base) MCG/ACT inhaler Inhale 2 puffs into the lungs every 6 (six) hours as needed for wheezing or shortness of breath. 1 Inhaler 2  . apixaban (ELIQUIS) 5 MG TABS tablet Take 2 tablets (10 mg total) by mouth 2 (two) times daily. X 7 days 28 tablet 0  . apixaban (ELIQUIS) 5 MG TABS tablet Take 1 tablet (5 mg total) by mouth 2 (two) times daily. 60 tablet 5  . atorvastatin (LIPITOR) 40 MG tablet Take 1 tablet (40 mg total) by mouth daily. 90 tablet 3  . benzonatate (TESSALON) 100 MG capsule Take 1 capsule (100 mg total) by mouth 3 (three) times daily as needed for cough. 30 capsule 3  . budesonide-formoterol (SYMBICORT) 160-4.5 MCG/ACT inhaler Inhale 2 puffs into the lungs 2 (two) times daily.    . clonazePAM (KLONOPIN) 0.5 MG tablet Take 1.5 tablets (0.75 mg total) by mouth at bedtime. And daily as needed for anxiety 10 tablet 0  . Coenzyme Q-10 200 MG CAPS  Take 200 mg by mouth 2 (two) times daily.     Marland Kitchen dofetilide (TIKOSYN) 125 MCG capsule Take 125 mcg by mouth 2 (two) times daily.    Marland Kitchen FeFum-FePo-FA-B Cmp-C-Zn-Mn-Cu (TANDEM PLUS PO) Take 1 tablet by mouth daily.    . furosemide (LASIX) 40 MG tablet Take 1 tablet (40 mg total) by mouth daily. 30 tablet 0  . gabapentin (NEURONTIN) 100 MG capsule Take 100 mg by mouth 3 (three) times daily.    Marland Kitchen guaifenesin (ROBITUSSIN) 100 MG/5ML syrup Take 100 mg by mouth 4 (four) times daily as needed for cough.    . hydroxychloroquine (PLAQUENIL) 200 MG tablet Take by mouth daily.    Marland Kitchen levothyroxine (SYNTHROID, LEVOTHROID) 88 MCG tablet Take 88 mcg by mouth daily before breakfast.    . loperamide (IMODIUM A-D) 2 MG tablet Take 2 mg by mouth 4 (four) times daily as needed for diarrhea or loose stools.    Marland Kitchen loratadine (CLARITIN) 10 MG tablet Take 10 mg by mouth daily.    . memantine (NAMENDA) 10 MG tablet Take 10 mg by mouth 2 (two) times daily.    . metoprolol tartrate (LOPRESSOR) 25 MG tablet Take 25 mg by mouth daily.     . mirtazapine (REMERON) 15 MG tablet Take 7.5 mg by mouth at bedtime.     . pantoprazole (PROTONIX) 40 MG tablet Take 40 mg by mouth daily.    . polyethylene glycol (MIRALAX / GLYCOLAX) packet Take  17 g by mouth daily as needed for mild constipation.    . potassium chloride SA (K-DUR,KLOR-CON) 20 MEQ tablet Take 20 mEq by mouth daily.     . predniSONE (DELTASONE) 10 MG tablet Prednisone dosing: Take  Prednisone 40mg  (4 tabs) x 2 days, then taper to 30mg  (3 tabs) x 2 days, then 20mg  (2 tabs) x 2 days, then 10mg  (1 tab) x2 days, then continue maintenance dose of prednisone 5 mg daily 21 tablet 0  . rivastigmine (EXELON) 4.6 mg/24hr Place 4.6 mg onto the skin daily.    . sertraline (ZOLOFT) 100 MG tablet Take 100 mg by mouth daily.    . sodium chloride 1 g tablet Take 1 g by mouth 2 (two) times daily with a meal.    . tiotropium (SPIRIVA) 18 MCG inhalation capsule Place 18 mcg into inhaler and  inhale daily.    . traMADol (ULTRAM) 50 MG tablet Take 1 tablet (50 mg total) by mouth 2 (two) times daily. 10 tablet 0   No current facility-administered medications for this visit.      Past Medical History:  Diagnosis Date  . Allergy   . Anxiety   . Arthritis   . CAD (coronary artery disease)   . Cataract   . COPD (chronic obstructive pulmonary disease) ([place])   . Dementia   . Depression   . Myocardial infarction ([place])   . Stage IV squamous cell carcinoma of left lung ([place]) 09/25/2016    Past Surgical History:  Procedure Laterality Date  . ABDOMINAL HYSTERECTOMY    . CORONARY ARTERY BYPASS GRAFT    . OOPHORECTOMY    . TONSILLECTOMY      Social History   Socioeconomic History  . Marital status: Divorced    Spouse name: Not on file  . Number of children: 1  . Years of education: Not on file  . Highest education level: Not on file  Occupational History  . Not on file  Social Needs  . Financial resource strain: Not on file  . Food insecurity:    Worry: Not on file    Inability: Not on file  . Transportation needs:    Medical: Not on file    Non-medical: Not on file  Tobacco Use  . Smoking status: Former Smoker    Packs/day: 1.00    Years: 40.00    Pack years: 40.00    Types: Cigarettes    Last attempt to quit: 03/22/1996    Years since quitting: 21.2  . Smokeless tobacco: Never Used  Substance and Sexual Activity  . Alcohol use: No  . Drug use: No  . Sexual activity: Not on file  Lifestyle  . Physical activity:    Days per week: Not on file    Minutes per session: Not on file  . Stress: Not on file  Relationships  . Social connections:    Talks on phone: Not on file    Gets together: Not on file    Attends religious service: Not on file    Active member of club or organization: Not on file    Attends meetings of clubs or organizations: Not on file    Relationship status: Not on file  . Intimate partner violence:    Fear of current or ex partner:  Not on file    Emotionally abused: Not on file    Physically abused: Not on file    Forced sexual activity: Not on file  Other Topics Concern  .  Not on file  Social History Narrative  . Not on file    Family History  Problem Relation Age of Onset  . Parkinsonism Father     ROS: no fevers or chills, productive cough, hemoptysis, dysphasia, odynophagia, melena, hematochezia, dysuria, hematuria, rash, seizure activity, orthopnea, PND, pedal edema, claudication. Remaining systems are negative.  Physical Exam: Well-developed well-nourished in no acute distress.  Skin is warm and dry.  HEENT is normal.  Neck is supple.  Chest  with diminished breath sounds Cardiovascular exam is irregular Abdominal exam nontender or distended. No masses palpated. Extremities show no edema. neuro grossly intact  ECG-atrial fibrillation at a rate of 56.  Incomplete left bundle branch block.  Personally reviewed  A/P  1 coronary artery disease status post coronary artery bypass graft-patient doing well with no chest pain.  Continue medical therapy including statin.  No aspirin given need for apixaban.  2 paroxysmal atrial fibrillation-patient is in recurrent atrial fibrillation today.  Her rate is controlled.  She is not symptomatic.  I would therefore favor continued rate control and anticoagulation.  Discontinue Tikosyn.  Continue metoprolol.  Her apixaban dosing is for pulmonary embolus.    3 hyperlipidemia-continue statin.  Kirk Ruths, MD

## 2017-06-04 ENCOUNTER — Ambulatory Visit (INDEPENDENT_AMBULATORY_CARE_PROVIDER_SITE_OTHER): Payer: Medicare Other | Admitting: Cardiology

## 2017-06-04 ENCOUNTER — Encounter: Payer: Self-pay | Admitting: Cardiology

## 2017-06-04 VITALS — BP 92/43 | HR 56 | Ht 65.0 in | Wt 107.2 lb

## 2017-06-04 DIAGNOSIS — I481 Persistent atrial fibrillation: Secondary | ICD-10-CM

## 2017-06-04 DIAGNOSIS — I2510 Atherosclerotic heart disease of native coronary artery without angina pectoris: Secondary | ICD-10-CM

## 2017-06-04 DIAGNOSIS — E7800 Pure hypercholesterolemia, unspecified: Secondary | ICD-10-CM | POA: Diagnosis not present

## 2017-06-04 DIAGNOSIS — I4819 Other persistent atrial fibrillation: Secondary | ICD-10-CM

## 2017-06-04 NOTE — Patient Instructions (Signed)
Medication Instructions:   STOP DEFOTILIDE   Follow-Up:  Your physician wants you to follow-up in: [place] will receive a reminder letter in the mail two months in advance. If you don't receive a letter, please call our office to schedule the follow-up appointment.   If you need a refill on your cardiac medications before your next appointment, please call your pharmacy.

## 2017-06-28 ENCOUNTER — Telehealth: Payer: Self-pay | Admitting: Internal Medicine

## 2017-06-28 NOTE — Telephone Encounter (Signed)
Patient son came into scheduling to r/s ct and lab on 6/25 and f/u on 6/26 due to wrong date. Per patient son he was unaware of appt change and wants to r/s to a different day , per son nursing facility was not suppose to reschedule. - pt son is aware of new appts day and time.

## 2017-07-01 ENCOUNTER — Other Ambulatory Visit: Payer: Medicare Other

## 2017-07-01 ENCOUNTER — Ambulatory Visit (HOSPITAL_COMMUNITY): Payer: Medicare Other

## 2017-07-02 ENCOUNTER — Ambulatory Visit (HOSPITAL_COMMUNITY): Payer: Medicare Other

## 2017-07-02 ENCOUNTER — Inpatient Hospital Stay: Payer: Medicare Other

## 2017-07-03 ENCOUNTER — Ambulatory Visit: Payer: Medicare Other | Admitting: Internal Medicine

## 2017-07-04 ENCOUNTER — Ambulatory Visit (HOSPITAL_COMMUNITY)
Admission: RE | Admit: 2017-07-04 | Discharge: 2017-07-04 | Disposition: A | Discharge status: Home / Self Care | Payer: Medicare Other | Source: Ambulatory Visit | Attending: Internal Medicine | Admitting: Internal Medicine

## 2017-07-04 ENCOUNTER — Inpatient Hospital Stay: Payer: Medicare Other | Attending: Internal Medicine

## 2017-07-04 DIAGNOSIS — Z951 Presence of aortocoronary bypass graft: Secondary | ICD-10-CM | POA: Insufficient documentation

## 2017-07-04 DIAGNOSIS — R918 Other nonspecific abnormal finding of lung field: Secondary | ICD-10-CM | POA: Diagnosis not present

## 2017-07-04 DIAGNOSIS — C3490 Malignant neoplasm of unspecified part of unspecified bronchus or lung: Secondary | ICD-10-CM

## 2017-07-04 DIAGNOSIS — Z85118 Personal history of other malignant neoplasm of bronchus and lung: Secondary | ICD-10-CM | POA: Diagnosis not present

## 2017-07-04 LAB — CMP (CANCER CENTER ONLY)
ALK PHOS: 61 U/L (ref 38–126)
ALT: 27 U/L (ref 0–44)
ANION GAP: 8 (ref 5–15)
AST: 24 U/L (ref 15–41)
Albumin: 3.9 g/dL (ref 3.5–5.0)
BILIRUBIN TOTAL: 0.3 mg/dL (ref 0.3–1.2)
BUN: 24 mg/dL — ABNORMAL HIGH (ref 8–23)
CALCIUM: 9.5 mg/dL (ref 8.9–10.3)
CO2: 33 mmol/L — ABNORMAL HIGH (ref 22–32)
Chloride: 103 mmol/L (ref 98–111)
Creatinine: 1.54 mg/dL — ABNORMAL HIGH (ref 0.44–1.00)
GFR, Est AFR Am: 36 mL/min — ABNORMAL LOW (ref >60)
GFR, Estimated: 31 mL/min — ABNORMAL LOW (ref >60)
Glucose, Bld: 89 mg/dL (ref 70–99)
POTASSIUM: 3.8 mmol/L (ref 3.5–5.1)
Sodium: 144 mmol/L (ref 135–145)
TOTAL PROTEIN: 6.6 g/dL (ref 6.5–8.1)

## 2017-07-04 LAB — CBC WITH DIFFERENTIAL (CANCER CENTER ONLY)
BASOS PCT: 1 %
Basophils Absolute: 0.1 10*3/uL (ref 0.0–0.1)
Eosinophils Absolute: 0.2 10*3/uL (ref 0.0–0.5)
Eosinophils Relative: 3 %
HEMATOCRIT: 34 % — AB (ref 34.8–46.6)
Hemoglobin: 10.5 g/dL — ABNORMAL LOW (ref 11.6–15.9)
LYMPHS PCT: 16 %
Lymphs Abs: 1.3 10*3/uL (ref 0.9–3.3)
MCH: 31.1 pg (ref 25.1–34.0)
MCHC: 30.9 g/dL — AB (ref 31.5–36.0)
MCV: 100.6 fL (ref 79.5–101.0)
MONO ABS: 0.7 10*3/uL (ref 0.1–0.9)
Monocytes Relative: 9 %
NEUTROS ABS: 5.4 10*3/uL (ref 1.5–6.5)
Neutrophils Relative %: 71 %
Platelet Count: 267 10*3/uL (ref 145–400)
RBC: 3.38 MIL/uL — ABNORMAL LOW (ref 3.70–5.45)
RDW: 15.9 % — AB (ref 11.2–14.5)
WBC Count: 7.6 10*3/uL (ref 3.9–10.3)

## 2017-07-08 NOTE — Progress Notes (Signed)
[place] OFFICE PROGRESS NOTE  [address] [place] [address]  DIAGNOSIS: Stage IV non-small cell lung cancer presented with bilateral pulmonary nodules in addition to left hilar and mediastinal lymphadenopathy diagnosed in October 2015  PRIOR THERAPY: Status post 11 cycles of treatment with Nivolumab discontinued in May 2016 secondary to intolerance but the patient had good response to this treatment at that time.  CURRENT THERAPY: Observation for the last few years. She has significant dementia.  INTERVAL HISTORY: Kristin Peterson 83 y.o. female returns for routine follow-up visit accompanied by her son.  The patient is feeling fine today and has no specific complaints.  She continues to reside at an assisted living facility.  The patient denies fevers and chills.  Denies chest pain, shortness of breath, cough, hemoptysis.  She continues to wear home oxygen.  Denies nausea, vomiting, constipation, diarrhea.  Denies recent weight loss or night sweats.  She had a recent restaging CT scan of the chest and is here to discuss the results.  MEDICAL HISTORY: Past Medical History:  Diagnosis Date  . Allergy   . Anxiety   . Arthritis   . CAD (coronary artery disease)   . Cataract   . COPD (chronic obstructive pulmonary disease) ([place])   . Dementia   . Depression   . Myocardial infarction ([place])   . Stage IV squamous cell carcinoma of left lung ([place]) 09/25/2016    ALLERGIES:  is allergic to sulfa antibiotics and contrast media [iodinated diagnostic agents].  MEDICATIONS:  Current Outpatient Medications  Medication Sig Dispense Refill  . acetaminophen (TYLENOL) 650 MG CR tablet Take 650 mg by mouth every 6 (six) hours.     Marland Kitchen albuterol (PROVENTIL HFA;VENTOLIN HFA) 108 (90 Base) MCG/ACT inhaler Inhale 2 puffs into the lungs every 6 (six) hours as needed for wheezing or shortness of breath. 1 Inhaler 2  . apixaban (ELIQUIS) 5 MG TABS  tablet Take 1 tablet (5 mg total) by mouth 2 (two) times daily. 60 tablet 5  . atorvastatin (LIPITOR) 40 MG tablet Take 1 tablet (40 mg total) by mouth daily. 90 tablet 3  . benzonatate (TESSALON) 100 MG capsule Take 1 capsule (100 mg total) by mouth 3 (three) times daily as needed for cough. 30 capsule 3  . budesonide-formoterol (SYMBICORT) 160-4.5 MCG/ACT inhaler Inhale 2 puffs into the lungs 2 (two) times daily.    . clonazePAM (KLONOPIN) 0.5 MG tablet Take 1.5 tablets (0.75 mg total) by mouth at bedtime. And daily as needed for anxiety 10 tablet 0  . Coenzyme Q-10 200 MG CAPS Take 200 mg by mouth 2 (two) times daily.     Marland Kitchen FeFum-FePo-FA-B Cmp-C-Zn-Mn-Cu (TANDEM PLUS PO) Take 1 tablet by mouth daily.    . furosemide (LASIX) 40 MG tablet Take 1 tablet (40 mg total) by mouth daily. 30 tablet 0  . gabapentin (NEURONTIN) 100 MG capsule Take 100 mg by mouth 3 (three) times daily.    Marland Kitchen guaifenesin (ROBITUSSIN) 100 MG/5ML syrup Take 100 mg by mouth 4 (four) times daily as needed for cough.    . hydroxychloroquine (PLAQUENIL) 200 MG tablet Take by mouth daily.    Marland Kitchen levothyroxine (SYNTHROID, LEVOTHROID) 88 MCG tablet Take 88 mcg by mouth daily before breakfast.    . loperamide (IMODIUM A-D) 2 MG tablet Take 2 mg by mouth 4 (four) times daily as needed for diarrhea or loose stools.    Marland Kitchen loratadine (CLARITIN) 10 MG tablet  Take 10 mg by mouth daily.    . memantine (NAMENDA) 10 MG tablet Take 10 mg by mouth 2 (two) times daily.    . metoprolol tartrate (LOPRESSOR) 25 MG tablet Take 25 mg by mouth daily.     . mirtazapine (REMERON) 15 MG tablet Take 7.5 mg by mouth at bedtime.     . pantoprazole (PROTONIX) 40 MG tablet Take 40 mg by mouth daily.    . polyethylene glycol (MIRALAX / GLYCOLAX) packet Take 17 g by mouth daily as needed for mild constipation.    . potassium chloride SA (K-DUR,KLOR-CON) 20 MEQ tablet Take 20 mEq by mouth daily.     . predniSONE (DELTASONE) 10 MG tablet Prednisone dosing: Take   Prednisone 40mg  (4 tabs) x 2 days, then taper to 30mg  (3 tabs) x 2 days, then 20mg  (2 tabs) x 2 days, then 10mg  (1 tab) x2 days, then continue maintenance dose of prednisone 5 mg daily 21 tablet 0  . rivastigmine (EXELON) 4.6 mg/24hr Place 4.6 mg onto the skin daily.    . sertraline (ZOLOFT) 100 MG tablet Take 100 mg by mouth daily.    . sodium chloride 1 g tablet Take 1 g by mouth 2 (two) times daily with a meal.    . tiotropium (SPIRIVA) 18 MCG inhalation capsule Place 18 mcg into inhaler and inhale daily.    . traMADol (ULTRAM) 50 MG tablet Take 1 tablet (50 mg total) by mouth 2 (two) times daily. 10 tablet 0   No current facility-administered medications for this visit.     SURGICAL HISTORY:  Past Surgical History:  Procedure Laterality Date  . ABDOMINAL HYSTERECTOMY    . CORONARY ARTERY BYPASS GRAFT    . OOPHORECTOMY    . TONSILLECTOMY      REVIEW OF SYSTEMS:   Review of Systems  Constitutional: Negative for appetite change, chills, fatigue, fever and unexpected weight change.  HENT:   Negative for mouth sores, nosebleeds, sore throat and trouble swallowing.   Eyes: Negative for eye problems and icterus.  Respiratory: Negative for cough, hemoptysis, shortness of breath and wheezing.   Cardiovascular: Negative for chest pain and leg swelling.  Gastrointestinal: Negative for abdominal pain, constipation, diarrhea, nausea and vomiting.  Genitourinary: Negative for bladder incontinence, difficulty urinating, dysuria, frequency and hematuria.   Musculoskeletal: Negative for back pain, neck pain and neck stiffness.  Skin: Negative for itching and rash.  Neurological: Negative for dizziness, extremity weakness, headaches, light-headedness and seizures.  Hematological: Negative for adenopathy. Does not bruise/bleed easily.  Psychiatric/Behavioral: Positive for confusion related to her dementia. The patient is not nervous/anxious.     PHYSICAL EXAMINATION:  Blood pressure 91/61, pulse  67, temperature 98.5 F (36.9 C), temperature source Oral, resp. rate 18, height 5\' 5"  (1.651 m), weight 109 lb 6.4 oz (49.6 kg), SpO2 95 %.  ECOG PERFORMANCE STATUS: 1 - Symptomatic but completely ambulatory  Physical Exam  Constitutional: Oriented to person, place, and time. No distress.  HENT:  Head: Normocephalic and atraumatic.  Mouth/Throat: Oropharynx is clear and moist. No oropharyngeal exudate.  Eyes: Conjunctivae are normal. Right eye exhibits no discharge. Left eye exhibits no discharge. No scleral icterus.  Neck: Normal range of motion. Neck supple.  Cardiovascular: Normal rate, regular rhythm, normal heart sounds and intact distal pulses.   Pulmonary/Chest: Effort normal and breath sounds normal. No respiratory distress. No wheezes. No rales.  Abdominal: Soft. Bowel sounds are normal. Exhibits no distension and no mass. There is no tenderness.  Musculoskeletal: Normal range of motion. Exhibits no edema.  Lymphadenopathy:    No cervical adenopathy.  Neurological: Alert and oriented to person, place, and time. Exhibits normal muscle tone. Gait normal. Coordination normal.  Skin: Skin is warm and dry. No rash noted. Not diaphoretic. No erythema. No pallor.  Psychiatric: Mood, memory and judgment normal.  Vitals reviewed.  LABORATORY DATA: Lab Results  Component Value Date   WBC 7.6 07/04/2017   HGB 10.5 (L) 07/04/2017   HCT 34.0 (L) 07/04/2017   MCV 100.6 07/04/2017   PLT 267 07/04/2017      Chemistry      Component Value Date/Time   NA 144 07/04/2017 0834   K 3.8 07/04/2017 0834   CL 103 07/04/2017 0834   CO2 33 (H) 07/04/2017 0834   BUN 24 (H) 07/04/2017 0834   CREATININE 1.54 (H) 07/04/2017 0834      Component Value Date/Time   CALCIUM 9.5 07/04/2017 0834   ALKPHOS 61 07/04/2017 0834   AST 24 07/04/2017 0834   ALT 27 07/04/2017 0834   BILITOT 0.3 07/04/2017 0834       RADIOGRAPHIC STUDIES:  Ct Chest Wo Contrast  Result Date: 07/05/2017 CLINICAL  DATA:  Lung cancer, chemotherapy altered after 1 treatment. Immunotherapy through 2016. EXAM: CT CHEST WITHOUT CONTRAST TECHNIQUE: Multidetector CT imaging of the chest was performed following the standard protocol without IV contrast. COMPARISON:  None. FINDINGS: Cardiovascular: Coronary artery calcification and aortic atherosclerotic calcification. Port in the anterior chest wall with tip in distal SVC. CABG anatomy Mediastinum/Nodes: No axillary or supraclavicular adenopathy. No mediastinal hilar adenopathy. No pericardial effusion. Esophagus normal. Lungs/Pleura: Centrilobular emphysema the upper lobes. No pulmonary infection or pulmonary infarction. No suspicious nodularity. Upper Abdomen: Limited view of the liver, kidneys, pancreas are unremarkable. Normal adrenal glands. Musculoskeletal: No aggressive osseous lesion. IMPRESSION: 1. No evidence lung cancer recurrence. 2. Emphysematous change 3. Post CABG. Electronically Signed   By: Suzy Bouchard M.D.   On: 07/05/2017 10:48     ASSESSMENT/PLAN:  Stage IV squamous cell carcinoma of left lung [facility]) This is a very pleasant 83 year old white female with a stage IV non-small cell lung cancer status post treatment with Nivolumab for 11 cycles discontinued in May 2016 secondary to intolerance but the patient had good response to the treatment at that time.  She is currently on observation. She had a restaging CT scan of the chest and is here to discuss the results.  CT scan results were discussed with the patient and her son which showed no evidence of disease recurrence.  Recommend that she continue on observation. She will have a restaging CT scan of the chest in approximately 6 months.  She will follow-up 1 to 2 days after the CT scan to discuss the results.  She was advised to call immediately if she has any concerning symptoms in the interval. The patient voices understanding of current disease status and treatment options and is in agreement  with the current care plan. All questions were answered. The patient knows to call the clinic with any problems, questions or concerns. We can certainly see the patient much sooner if necessary.   Orders Placed This Encounter  Procedures  . CT CHEST W CONTRAST    Standing Status:   Future    Standing Expiration Date:   07/10/2018    Order Specific Question:   If indicated for the ordered procedure, I authorize the administration of contrast media per Radiology protocol  Answer:   Yes    Order Specific Question:   Preferred imaging location?    Answer:   [facility]    Order Specific Question:   Radiology Contrast Protocol - do NOT remove file path    Answer:   [email or web address]    Order Specific Question:   ** REASON FOR EXAM (FREE TEXT)    Answer:   lung cancer. restaging  . CBC with Differential (Cancer Center Only)    Standing Status:   Future    Standing Expiration Date:   07/10/2018  . CMP ([place] only)    Standing Status:   Future    Standing Expiration Date:   07/10/2018   Mikey Bussing, [place], AGPCNP-BC, AOCNP 07/09/17 with a stage IV non-small cell lung cancer status post 11 cycle treatment with Nivolumab and she has been on observation for several years now and doing fine.  The patient came to the clinic today accompanied by her son.  She had a repeat CT scan of the chest for restaging of her disease.  Her scan showed no concerning findings for disease progression. I discussed the scan results with the patient and her son I recommended for her to continue on observation with repeat CT scan of the chest in 6 months.  The patient was advised to call immediately if she has any concerning symptoms in the interval.  Disclaimer: This note was dictated with voice recognition software. Similar sounding words can inadvertently be transcribed and may be missed upon review. Eilleen Kempf, MD 07/09/17   ADDENDUM: Hematology/Oncology  Attending: I had a face-to-face encounter with the patient.  I recommended her care plan.  This is a very pleasant 83 years old white female

## 2017-07-09 ENCOUNTER — Inpatient Hospital Stay: Payer: Medicare Other | Attending: Internal Medicine | Admitting: Oncology

## 2017-07-09 ENCOUNTER — Encounter: Payer: Self-pay | Admitting: Oncology

## 2017-07-09 ENCOUNTER — Telehealth: Payer: Self-pay

## 2017-07-09 VITALS — BP 91/61 | HR 67 | Temp 98.5°F | Resp 18 | Ht 65.0 in | Wt 109.4 lb

## 2017-07-09 DIAGNOSIS — Z79899 Other long term (current) drug therapy: Secondary | ICD-10-CM | POA: Diagnosis not present

## 2017-07-09 DIAGNOSIS — I252 Old myocardial infarction: Secondary | ICD-10-CM | POA: Insufficient documentation

## 2017-07-09 DIAGNOSIS — I2510 Atherosclerotic heart disease of native coronary artery without angina pectoris: Secondary | ICD-10-CM | POA: Diagnosis not present

## 2017-07-09 DIAGNOSIS — M129 Arthropathy, unspecified: Secondary | ICD-10-CM | POA: Insufficient documentation

## 2017-07-09 DIAGNOSIS — F418 Other specified anxiety disorders: Secondary | ICD-10-CM | POA: Insufficient documentation

## 2017-07-09 DIAGNOSIS — F0390 Unspecified dementia without behavioral disturbance: Secondary | ICD-10-CM | POA: Insufficient documentation

## 2017-07-09 DIAGNOSIS — C3492 Malignant neoplasm of unspecified part of left bronchus or lung: Secondary | ICD-10-CM

## 2017-07-09 DIAGNOSIS — Z85118 Personal history of other malignant neoplasm of bronchus and lung: Secondary | ICD-10-CM | POA: Diagnosis present

## 2017-07-09 DIAGNOSIS — J449 Chronic obstructive pulmonary disease, unspecified: Secondary | ICD-10-CM | POA: Diagnosis not present

## 2017-07-09 NOTE — Assessment & Plan Note (Signed)
This is a very pleasant 83 year old white female with a stage IV non-small cell lung cancer status post treatment with Nivolumab for 11 cycles discontinued in May 2016 secondary to intolerance but the patient had good response to the treatment at that time.  She is currently on observation. She had a restaging CT scan of the chest and is here to discuss the results.  CT scan results were discussed with the patient and her son which showed no evidence of disease recurrence.  Recommend that she continue on observation. She will have a restaging CT scan of the chest in approximately 6 months.  She will follow-up 1 to 2 days after the CT scan to discuss the results.  She was advised to call immediately if she has any concerning symptoms in the interval. The patient voices understanding of current disease status and treatment options and is in agreement with the current care plan. All questions were answered. The patient knows to call the clinic with any problems, questions or concerns. We can certainly see the patient much sooner if necessary.

## 2017-07-09 NOTE — Telephone Encounter (Signed)
PRINTED AVS AND CALENDER OF UPCOMMING APPOINTMENT. PER 7/2 LOS

## 2017-08-22 ENCOUNTER — Inpatient Hospital Stay (HOSPITAL_COMMUNITY)
Admission: EM | Admit: 2017-08-22 | Discharge: 2017-08-26 | DRG: 641 | Disposition: A | Discharge status: Skilled Nursing Facility | Payer: Medicare Other | Source: Skilled Nursing Facility | Attending: Internal Medicine | Admitting: Internal Medicine

## 2017-08-22 ENCOUNTER — Emergency Department (HOSPITAL_COMMUNITY): Payer: Medicare Other

## 2017-08-22 ENCOUNTER — Encounter (HOSPITAL_COMMUNITY): Payer: Self-pay

## 2017-08-22 DIAGNOSIS — T68XXXA Hypothermia, initial encounter: Secondary | ICD-10-CM | POA: Diagnosis not present

## 2017-08-22 DIAGNOSIS — Z90710 Acquired absence of both cervix and uterus: Secondary | ICD-10-CM | POA: Diagnosis not present

## 2017-08-22 DIAGNOSIS — Z820 Family history of epilepsy and other diseases of the nervous system: Secondary | ICD-10-CM | POA: Diagnosis not present

## 2017-08-22 DIAGNOSIS — Z681 Body mass index (BMI) 19 or less, adult: Secondary | ICD-10-CM | POA: Diagnosis not present

## 2017-08-22 DIAGNOSIS — Z882 Allergy status to sulfonamides status: Secondary | ICD-10-CM | POA: Diagnosis not present

## 2017-08-22 DIAGNOSIS — M1990 Unspecified osteoarthritis, unspecified site: Secondary | ICD-10-CM | POA: Diagnosis present

## 2017-08-22 DIAGNOSIS — Z66 Do not resuscitate: Secondary | ICD-10-CM | POA: Diagnosis not present

## 2017-08-22 DIAGNOSIS — I5032 Chronic diastolic (congestive) heart failure: Secondary | ICD-10-CM | POA: Diagnosis present

## 2017-08-22 DIAGNOSIS — I2510 Atherosclerotic heart disease of native coronary artery without angina pectoris: Secondary | ICD-10-CM | POA: Diagnosis present

## 2017-08-22 DIAGNOSIS — R197 Diarrhea, unspecified: Secondary | ICD-10-CM

## 2017-08-22 DIAGNOSIS — N183 Chronic kidney disease, stage 3 unspecified: Secondary | ICD-10-CM | POA: Diagnosis present

## 2017-08-22 DIAGNOSIS — C3492 Malignant neoplasm of unspecified part of left bronchus or lung: Secondary | ICD-10-CM | POA: Diagnosis present

## 2017-08-22 DIAGNOSIS — I447 Left bundle-branch block, unspecified: Secondary | ICD-10-CM | POA: Diagnosis present

## 2017-08-22 DIAGNOSIS — Z91041 Radiographic dye allergy status: Secondary | ICD-10-CM

## 2017-08-22 DIAGNOSIS — R680 Hypothermia, not associated with low environmental temperature: Secondary | ICD-10-CM | POA: Diagnosis present

## 2017-08-22 DIAGNOSIS — J449 Chronic obstructive pulmonary disease, unspecified: Secondary | ICD-10-CM | POA: Diagnosis present

## 2017-08-22 DIAGNOSIS — Z79890 Hormone replacement therapy (postmenopausal): Secondary | ICD-10-CM

## 2017-08-22 DIAGNOSIS — J441 Chronic obstructive pulmonary disease with (acute) exacerbation: Secondary | ICD-10-CM | POA: Diagnosis not present

## 2017-08-22 DIAGNOSIS — Z515 Encounter for palliative care: Secondary | ICD-10-CM | POA: Diagnosis not present

## 2017-08-22 DIAGNOSIS — C3490 Malignant neoplasm of unspecified part of unspecified bronchus or lung: Secondary | ICD-10-CM

## 2017-08-22 DIAGNOSIS — Z86711 Personal history of pulmonary embolism: Secondary | ICD-10-CM

## 2017-08-22 DIAGNOSIS — Z7951 Long term (current) use of inhaled steroids: Secondary | ICD-10-CM | POA: Diagnosis not present

## 2017-08-22 DIAGNOSIS — Z951 Presence of aortocoronary bypass graft: Secondary | ICD-10-CM

## 2017-08-22 DIAGNOSIS — Z7901 Long term (current) use of anticoagulants: Secondary | ICD-10-CM | POA: Diagnosis not present

## 2017-08-22 DIAGNOSIS — I4581 Long QT syndrome: Secondary | ICD-10-CM | POA: Diagnosis present

## 2017-08-22 DIAGNOSIS — I252 Old myocardial infarction: Secondary | ICD-10-CM

## 2017-08-22 DIAGNOSIS — F0390 Unspecified dementia without behavioral disturbance: Secondary | ICD-10-CM | POA: Diagnosis present

## 2017-08-22 DIAGNOSIS — E039 Hypothyroidism, unspecified: Secondary | ICD-10-CM | POA: Diagnosis present

## 2017-08-22 DIAGNOSIS — R627 Adult failure to thrive: Secondary | ICD-10-CM | POA: Diagnosis present

## 2017-08-22 DIAGNOSIS — E162 Hypoglycemia, unspecified: Secondary | ICD-10-CM | POA: Diagnosis present

## 2017-08-22 DIAGNOSIS — E1122 Type 2 diabetes mellitus with diabetic chronic kidney disease: Secondary | ICD-10-CM | POA: Diagnosis present

## 2017-08-22 DIAGNOSIS — R402132 Coma scale, eyes open, to sound, at arrival to emergency department: Secondary | ICD-10-CM | POA: Diagnosis present

## 2017-08-22 DIAGNOSIS — R402352 Coma scale, best motor response, localizes pain, at arrival to emergency department: Secondary | ICD-10-CM | POA: Diagnosis present

## 2017-08-22 DIAGNOSIS — J42 Unspecified chronic bronchitis: Secondary | ICD-10-CM

## 2017-08-22 DIAGNOSIS — I480 Paroxysmal atrial fibrillation: Secondary | ICD-10-CM | POA: Diagnosis present

## 2017-08-22 DIAGNOSIS — Z87891 Personal history of nicotine dependence: Secondary | ICD-10-CM | POA: Diagnosis not present

## 2017-08-22 DIAGNOSIS — G9340 Encephalopathy, unspecified: Secondary | ICD-10-CM | POA: Diagnosis present

## 2017-08-22 DIAGNOSIS — R400 Somnolence: Secondary | ICD-10-CM

## 2017-08-22 DIAGNOSIS — Z9221 Personal history of antineoplastic chemotherapy: Secondary | ICD-10-CM

## 2017-08-22 DIAGNOSIS — R6510 Systemic inflammatory response syndrome (SIRS) of non-infectious origin without acute organ dysfunction: Secondary | ICD-10-CM | POA: Diagnosis present

## 2017-08-22 DIAGNOSIS — R402242 Coma scale, best verbal response, confused conversation, at arrival to emergency department: Secondary | ICD-10-CM | POA: Diagnosis present

## 2017-08-22 LAB — CBC WITH DIFFERENTIAL/PLATELET
Abs Immature Granulocytes: 0.1 10*3/uL (ref 0.0–0.1)
Basophils Absolute: 0.1 10*3/uL (ref 0.0–0.1)
Basophils Relative: 0 %
EOS ABS: 0.1 10*3/uL (ref 0.0–0.7)
EOS PCT: 1 %
HEMATOCRIT: 34 % — AB (ref 36.0–46.0)
Hemoglobin: 10.3 g/dL — ABNORMAL LOW (ref 12.0–15.0)
Immature Granulocytes: 0 %
LYMPHS ABS: 0.4 10*3/uL — AB (ref 0.7–4.0)
LYMPHS PCT: 4 %
MCH: 31.7 pg (ref 26.0–34.0)
MCHC: 30.3 g/dL (ref 30.0–36.0)
MCV: 104.6 fL — AB (ref 78.0–100.0)
MONO ABS: 0.6 10*3/uL (ref 0.1–1.0)
MONOS PCT: 6 %
Neutro Abs: 9.9 10*3/uL — ABNORMAL HIGH (ref 1.7–7.7)
Neutrophils Relative %: 89 %
Platelets: 283 10*3/uL (ref 150–400)
RBC: 3.25 MIL/uL — ABNORMAL LOW (ref 3.87–5.11)
RDW: 14.1 % (ref 11.5–15.5)
WBC: 11.2 10*3/uL — AB (ref 4.0–10.5)

## 2017-08-22 LAB — GLUCOSE, CAPILLARY
GLUCOSE-CAPILLARY: 46 mg/dL — AB (ref 70–99)
GLUCOSE-CAPILLARY: 71 mg/dL (ref 70–99)
GLUCOSE-CAPILLARY: 96 mg/dL (ref 70–99)
GLUCOSE-CAPILLARY: 136 mg/dL — AB (ref 70–99)
GLUCOSE-CAPILLARY: 125 mg/dL — AB (ref 70–99)
Glucose-Capillary: 28 mg/dL — CL (ref 70–99)
Glucose-Capillary: 180 mg/dL — ABNORMAL HIGH (ref 70–99)
Glucose-Capillary: 139 mg/dL — ABNORMAL HIGH (ref 70–99)
Glucose-Capillary: 57 mg/dL — ABNORMAL LOW (ref 70–99)
Glucose-Capillary: 71 mg/dL (ref 70–99)
Glucose-Capillary: 166 mg/dL — ABNORMAL HIGH (ref 70–99)
Glucose-Capillary: 87 mg/dL (ref 70–99)
Glucose-Capillary: 159 mg/dL — ABNORMAL HIGH (ref 70–99)
Glucose-Capillary: 61 mg/dL — ABNORMAL LOW (ref 70–99)
Glucose-Capillary: 83 mg/dL (ref 70–99)
Glucose-Capillary: 100 mg/dL — ABNORMAL HIGH (ref 70–99)
Glucose-Capillary: 115 mg/dL — ABNORMAL HIGH (ref 70–99)

## 2017-08-22 LAB — URINALYSIS, ROUTINE W REFLEX MICROSCOPIC
Bilirubin Urine: NEGATIVE
GLUCOSE, UA: NEGATIVE mg/dL
HGB URINE DIPSTICK: NEGATIVE
KETONES UR: NEGATIVE mg/dL
LEUKOCYTES UA: NEGATIVE
Nitrite: NEGATIVE
PROTEIN: NEGATIVE mg/dL
Specific Gravity, Urine: 1.011 (ref 1.005–1.030)
pH: 5 (ref 5.0–8.0)

## 2017-08-22 LAB — I-STAT CHEM 8, ED
BUN: 28 mg/dL — AB (ref 8–23)
CREATININE: 1.3 mg/dL — AB (ref 0.44–1.00)
Calcium, Ion: 1.21 mmol/L (ref 1.15–1.40)
Chloride: 102 mmol/L (ref 98–111)
GLUCOSE: 143 mg/dL — AB (ref 70–99)
HEMATOCRIT: 31 % — AB (ref 36.0–46.0)
HEMOGLOBIN: 10.5 g/dL — AB (ref 12.0–15.0)
Potassium: 3.6 mmol/L (ref 3.5–5.1)
Sodium: 142 mmol/L (ref 135–145)
TCO2: 28 mmol/L (ref 22–32)

## 2017-08-22 LAB — I-STAT TROPONIN, ED: Troponin i, poc: 0.02 ng/mL (ref 0.00–0.08)

## 2017-08-22 LAB — COMPREHENSIVE METABOLIC PANEL
ALK PHOS: 62 U/L (ref 38–126)
ALT: 24 U/L (ref 0–44)
AST: 33 U/L (ref 15–41)
Albumin: 3.5 g/dL (ref 3.5–5.0)
Anion gap: 8 (ref 5–15)
BUN: 25 mg/dL — AB (ref 8–23)
CALCIUM: 8.8 mg/dL — AB (ref 8.9–10.3)
CHLORIDE: 105 mmol/L (ref 98–111)
CO2: 29 mmol/L (ref 22–32)
CREATININE: 1.36 mg/dL — AB (ref 0.44–1.00)
GFR calc non Af Amer: 36 mL/min — ABNORMAL LOW (ref >60)
GFR, EST AFRICAN AMERICAN: 41 mL/min — AB (ref >60)
GLUCOSE: 149 mg/dL — AB (ref 70–99)
Potassium: 3.8 mmol/L (ref 3.5–5.1)
Sodium: 142 mmol/L (ref 135–145)
Total Bilirubin: 0.4 mg/dL (ref 0.3–1.2)
Total Protein: 6.1 g/dL — ABNORMAL LOW (ref 6.5–8.1)

## 2017-08-22 LAB — GASTROINTESTINAL PANEL BY PCR, STOOL (REPLACES STOOL CULTURE)

## 2017-08-22 LAB — PROTIME-INR
INR: 1.19
Prothrombin Time: 15 seconds (ref 11.4–15.2)

## 2017-08-22 LAB — LACTIC ACID, PLASMA: LACTIC ACID, VENOUS: 1.3 mmol/L (ref 0.5–1.9)

## 2017-08-22 LAB — TSH: TSH: 8.269 u[IU]/mL — ABNORMAL HIGH (ref 0.350–4.500)

## 2017-08-22 LAB — TROPONIN I
Troponin I: <0.03 ng/mL (ref <0.03)
Troponin I: <0.03 ng/mL (ref <0.03)

## 2017-08-22 LAB — CBG MONITORING, ED
Glucose-Capillary: 111 mg/dL — ABNORMAL HIGH (ref 70–99)
Glucose-Capillary: 192 mg/dL — ABNORMAL HIGH (ref 70–99)

## 2017-08-22 LAB — BETA-HYDROXYBUTYRIC ACID: BETA-HYDROXYBUTYRIC ACID: 0.06 mmol/L (ref 0.05–0.27)

## 2017-08-22 LAB — APTT: aPTT: 34 seconds (ref 24–36)

## 2017-08-22 LAB — I-STAT CG4 LACTIC ACID, ED: LACTIC ACID, VENOUS: 1.44 mmol/L (ref 0.5–1.9)

## 2017-08-22 LAB — CK: CK TOTAL: 108 U/L (ref 38–234)

## 2017-08-22 LAB — CORTISOL: Cortisol, Plasma: 13 ug/dL

## 2017-08-22 LAB — T4, FREE: Free T4: 0.55 ng/dL — ABNORMAL LOW (ref 0.82–1.77)

## 2017-08-22 LAB — MRSA PCR SCREENING: MRSA by PCR: NEGATIVE

## 2017-08-22 MED ORDER — LEVOTHYROXINE SODIUM 100 MCG IV SOLR
65.0000 ug | Freq: Every day | INTRAVENOUS | Status: DC
  Administered 2017-08-22: 65 ug via INTRAVENOUS
  Filled 2017-08-22 (×2): qty 5

## 2017-08-22 MED ORDER — FAMOTIDINE IN NACL 20-0.9 MG/50ML-% IV SOLN
20.0000 mg | INTRAVENOUS | Status: DC
  Administered 2017-08-22: 20 mg via INTRAVENOUS
  Filled 2017-08-22 (×2): qty 50

## 2017-08-22 MED ORDER — DEXTROSE 50 % IV SOLN
1.0000 | INTRAVENOUS | Status: DC | PRN
  Administered 2017-08-22 (×3): 50 mL via INTRAVENOUS
  Filled 2017-08-22 (×3): qty 50

## 2017-08-22 MED ORDER — VANCOMYCIN HCL IN DEXTROSE 1-5 GM/200ML-% IV SOLN
1000.0000 mg | Freq: Once | INTRAVENOUS | Status: AC
  Administered 2017-08-22: 1000 mg via INTRAVENOUS
  Filled 2017-08-22: qty 200

## 2017-08-22 MED ORDER — ONDANSETRON HCL 4 MG/2ML IJ SOLN: 4.0000 mg | Freq: Four times a day (QID) | INTRAMUSCULAR | Status: DC | PRN

## 2017-08-22 MED ORDER — ACETAMINOPHEN 325 MG PO TABS
650.0000 mg | ORAL_TABLET | Freq: Four times a day (QID) | ORAL | Status: DC | PRN
  Administered 2017-08-22 – 2017-08-23 (×3): 650 mg via ORAL
  Filled 2017-08-22 (×3): qty 2

## 2017-08-22 MED ORDER — SODIUM CHLORIDE 0.9 % IV SOLN
500.0000 mg | INTRAVENOUS | Status: DC
  Administered 2017-08-23: 500 mg via INTRAVENOUS
  Filled 2017-08-22 (×2): qty 500

## 2017-08-22 MED ORDER — MOMETASONE FURO-FORMOTEROL FUM 200-5 MCG/ACT IN AERO
2.0000 | INHALATION_SPRAY | Freq: Two times a day (BID) | RESPIRATORY_TRACT | Status: DC
  Administered 2017-08-22 – 2017-08-25 (×7): 2 via RESPIRATORY_TRACT
  Filled 2017-08-22 (×2): qty 8.8

## 2017-08-22 MED ORDER — TIOTROPIUM BROMIDE MONOHYDRATE 18 MCG IN CAPS
18.0000 ug | ORAL_CAPSULE | Freq: Every day | RESPIRATORY_TRACT | Status: DC
  Administered 2017-08-22 – 2017-08-25 (×4): 18 ug via RESPIRATORY_TRACT
  Filled 2017-08-22: qty 5

## 2017-08-22 MED ORDER — SODIUM CHLORIDE 0.9 % IV SOLN
1.0000 g | INTRAVENOUS | Status: DC
  Administered 2017-08-23 (×2): 1 g via INTRAVENOUS
  Filled 2017-08-22 (×2): qty 1

## 2017-08-22 MED ORDER — SODIUM CHLORIDE 0.9 % IV SOLN: INTRAVENOUS | Status: DC

## 2017-08-22 MED ORDER — NALOXONE HCL 0.4 MG/ML IJ SOLN
INTRAMUSCULAR | Status: AC
  Administered 2017-08-22: 0.4 mg via INTRAVENOUS
  Filled 2017-08-22: qty 1

## 2017-08-22 MED ORDER — SODIUM CHLORIDE 0.9% FLUSH
3.0000 mL | Freq: Two times a day (BID) | INTRAVENOUS | Status: DC
  Administered 2017-08-22 – 2017-08-26 (×9): 3 mL via INTRAVENOUS

## 2017-08-22 MED ORDER — DEXTROSE 50 % IV SOLN
INTRAVENOUS | Status: AC
  Administered 2017-08-22: 50 mL
  Filled 2017-08-22: qty 50

## 2017-08-22 MED ORDER — ACETAMINOPHEN 650 MG RE SUPP: 650.0000 mg | Freq: Four times a day (QID) | RECTAL | Status: DC | PRN

## 2017-08-22 MED ORDER — HYDROCORTISONE NA SUCCINATE PF 100 MG IJ SOLR
100.0000 mg | Freq: Three times a day (TID) | INTRAMUSCULAR | Status: DC
  Administered 2017-08-22 – 2017-08-23 (×3): 100 mg via INTRAVENOUS
  Filled 2017-08-22 (×3): qty 2

## 2017-08-22 MED ORDER — LEVOTHYROXINE SODIUM 100 MCG IV SOLR: 75.0000 ug | Freq: Every day | INTRAVENOUS | Status: DC

## 2017-08-22 MED ORDER — FAMOTIDINE IN NACL 20-0.9 MG/50ML-% IV SOLN: 20.0000 mg | Freq: Two times a day (BID) | INTRAVENOUS | Status: DC

## 2017-08-22 MED ORDER — METRONIDAZOLE IN NACL 5-0.79 MG/ML-% IV SOLN
500.0000 mg | Freq: Three times a day (TID) | INTRAVENOUS | Status: DC
  Administered 2017-08-22 – 2017-08-24 (×8): 500 mg via INTRAVENOUS
  Filled 2017-08-22 (×8): qty 100

## 2017-08-22 MED ORDER — APIXABAN 5 MG PO TABS: 5.0000 mg | ORAL_TABLET | Freq: Two times a day (BID) | ORAL | Status: DC

## 2017-08-22 MED ORDER — ALBUTEROL SULFATE (2.5 MG/3ML) 0.083% IN NEBU
2.5000 mg | INHALATION_SOLUTION | Freq: Four times a day (QID) | RESPIRATORY_TRACT | Status: DC | PRN
  Filled 2017-08-22: qty 3

## 2017-08-22 MED ORDER — NALOXONE HCL 0.4 MG/ML IJ SOLN
0.4000 mg | Freq: Once | INTRAMUSCULAR | Status: AC
  Administered 2017-08-22: 0.4 mg via INTRAVENOUS

## 2017-08-22 MED ORDER — DEXTROSE 10 % IV SOLN
INTRAVENOUS | Status: DC
  Administered 2017-08-22 – 2017-08-23 (×5): via INTRAVENOUS
  Filled 2017-08-22 (×3): qty 1000

## 2017-08-22 MED ORDER — APIXABAN 2.5 MG PO TABS
2.5000 mg | ORAL_TABLET | Freq: Two times a day (BID) | ORAL | Status: DC
  Administered 2017-08-22 – 2017-08-26 (×9): 2.5 mg via ORAL
  Filled 2017-08-22 (×9): qty 1

## 2017-08-22 MED ORDER — SODIUM CHLORIDE 0.9 % IV SOLN
INTRAVENOUS | Status: DC
  Administered 2017-08-22 – 2017-08-23 (×2): via INTRAVENOUS

## 2017-08-22 MED ORDER — ONDANSETRON HCL 4 MG PO TABS: 4.0000 mg | ORAL_TABLET | Freq: Four times a day (QID) | ORAL | Status: DC | PRN

## 2017-08-22 MED ORDER — SODIUM CHLORIDE 0.9 % IV BOLUS (SEPSIS)
1000.0000 mL | Freq: Once | INTRAVENOUS | Status: AC
  Administered 2017-08-22: 1000 mL via INTRAVENOUS

## 2017-08-22 MED ORDER — SODIUM CHLORIDE 0.9 % IV SOLN
2.0000 g | Freq: Once | INTRAVENOUS | Status: AC
  Administered 2017-08-22: 2 g via INTRAVENOUS
  Filled 2017-08-22: qty 2

## 2017-08-22 NOTE — ED Notes (Signed)
[name], Thomes Dinning, requested to rescind pt's DNR; states he wants everything that can be done for mother.

## 2017-08-22 NOTE — Progress Notes (Signed)
PROGRESS NOTE                                                                                                                                                                                                             Patient Demographics:    Kristin Peterson, is a 83 y.o. female, DOB - 08-31-1934, HQI:[id number]  Admit date - 08/22/2017   Admitting Physician Vianne Bulls, MD  Outpatient Primary MD for the patient is [facility]  LOS - 0   Chief Complaint  Patient presents with  . Hypoglycemia       Brief Narrative    This is a no charge note as patient admitted earlier today 83 y.o. female with medical history significant for dementia, coronary artery disease, paroxysmal atrial fibrillation on Eliquis, COPD, hypothyroidism, chronic kidney disease stage III, chronic diastolic CHF, and lung cancer, now presenting from her nursing facility after she was found down on the floor unresponsive with CBG 30, and hypothermia.    Subjective:    Kristin Peterson today denies any complaints, other than poor appetite, and generalized weakness .   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)   Chronic obstructive pulmonary disease (HCC)   CAD (coronary artery disease)   PAF (paroxysmal atrial fibrillation) (HCC)   CKD (chronic kidney disease) stage 3, GFR 30-59 ml/min (HCC)   History of pulmonary embolus (PE)   Hypothermia   SIRS (systemic inflammatory response syndrome) (HCC)   Acute encephalopathy   Hypothermia/hypoglycemia  -So far no clear etiology could be identified, infectious etiology is a possibility, but urine analysis and chest x-ray not suggestive of infection, abd exam is benign, no meningismus, no infected wounds identified , follow on blood cultures, continue with the broad-spectrum antibiotic coverage  including cefepime, Flagyl and vancomycin. -His ALT level is 13, within normal range, but may be low for her current stress, for now I will start empirically on IV hydrocortisone. -Please see discussion below regarding hypothyroidism as it may be contributing. -Continue to hold Plaquenil it may be causing hypoglycemia. -She is on bear hugger, currently temperature 100.4, has been discontinued. -Continue with D10 W, she still requiring as needed D50 pushes for hypoglycemia, if continues  will start on glucagon drip. -Follow on insulin, pro-insulin, c-peptide,  sulfonylurea panel  SIRS  - There is hypothermia and leukocytosis on admission  - UA and CXR reviewed and not suggestive of infection, - Blood cultures were collected in ED and she was started on empiric broad spectrum antibiotics  - Etiology of hypoglycemia not yet identified and sepsis remains on differential, so plan to continue empiric antibiotics for now    Acute encephalopathy  - Head CT is negative for acute findings  - Likely secondary to hypothermia, reassess after warmed   Hypothyroidism -TSH elevated at 8.2, free T4 is low at 0.5, she is on Synthroid 88 mcg daily, I will start on IV Synthroid 75 mcg daily, in the setting of hypothermia and hypoglycemia.  Stage IV adenocarcinoma of lung  - Diagnosed in 2015 and treated with chemotherapy until May 2016 when it was stopped d/t intolerance   - She continues to follow with oncology for observation - No evidence for recurrence on recent CT, planned for repeat scan in 6 months    Paroxysmal atrial fibrillation  - CHADS-VASc at least 5 (age x2, gender, CHF, CAD)  - Resume Eliquis   CAD - Hx of CABG, chronic LBBB - Troponin is normal in ED  - Resume statin once she is more appropriate for oral intake    COPD  - No wheezing on admission  - Continue ICS/LABA, Spiriva, and prn albuterol    CKD stage III  - Cr is 1.36 on admission, consistent with her baseline  -  Renally-dose medications   Chronic diastolic CHF  - Appears compensated  - Hydrating gently in setting of possible infection and while NPO d/t depressed level of consciousness  - Hold Lasix, follow I/O's and daily wts     Code Status : DNR  Family Communication  : None at bedside  Disposition Plan  : pending further work-up  Consults  :  none  Procedures  : none  DVT Prophylaxis  :  On Eliquis  Lab Results  Component Value Date   PLT 283 08/22/2017    Antibiotics  :    Anti-infectives (From admission, onward)   Start     Dose/Rate Route Frequency Ordered Stop   08/22/17 2200  vancomycin (VANCOCIN) 500 mg in sodium chloride 0.9 % 100 mL IVPB     500 mg 100 mL/hr over 60 Minutes Intravenous Every 24 hours 08/22/17 0401     08/22/17 2200  ceFEPIme (MAXIPIME) 1 g in sodium chloride 0.9 % 100 mL IVPB     1 g 200 mL/hr over 30 Minutes Intravenous Every 24 hours 08/22/17 0401     08/22/17 0115  ceFEPIme (MAXIPIME) 2 g in sodium chloride 0.9 % 100 mL IVPB     2 g 200 mL/hr over 30 Minutes Intravenous  Once 08/22/17 0105 08/22/17 0234   08/22/17 0115  metroNIDAZOLE (FLAGYL) IVPB 500 mg     500 mg 100 mL/hr over 60 Minutes Intravenous Every 8 hours 08/22/17 0105     08/22/17 0115  vancomycin (VANCOCIN) IVPB 1000 mg/200 mL premix     1,000 mg 200 mL/hr over 60 Minutes Intravenous  Once 08/22/17 0105 08/22/17 0416        Objective:   Vitals:   08/22/17 0603 08/22/17 0700 08/22/17 0800 08/22/17 0846  BP: (!) 87/56 (!) 98/53 (!) 95/51 (!) 106/49  Pulse: (!) 57 66 63 75  Resp: 17 17 18 19   Temp:   99.1 F (  37.3 C)   TempSrc:      SpO2: 100% 100% 100% 100%  Weight:      Height:        Wt Readings from Last 3 Encounters:  08/22/17 54.4 kg  07/09/17 49.6 kg  06/04/17 48.6 kg     Intake/Output Summary (Last 24 hours) at 08/22/2017 0954 Last data filed at 08/22/2017 0234 Gross per 24 hour  Intake 101.23 ml  Output -  Net 101.23 ml     Physical  Exam  Sleeping comfortably, somnolent , but wakes up to verbal stimuli answers questions appropriately and go back to sleep . Symmetrical Chest wall movement, Good air movement bilaterally, CTAB RRR,No Gallops,Rubs or new Murmurs, No Parasternal Heave +ve B.Sounds, Abd Soft, No tenderness,  No rebound - guarding or rigidity.    Data Review:    CBC Recent Labs  Lab 08/22/17 0031 08/22/17 0034  WBC 11.2*  --   HGB 10.3* 10.5*  HCT 34.0* 31.0*  PLT 283  --   MCV 104.6*  --   MCH 31.7  --   MCHC 30.3  --   RDW 14.1  --   LYMPHSABS 0.4*  --   MONOABS 0.6  --   EOSABS 0.1  --   BASOSABS 0.1  --     Chemistries  Recent Labs  Lab 08/22/17 0019 08/22/17 0034  NA 142 142  K 3.8 3.6  CL 105 102  CO2 29  --   GLUCOSE 149* 143*  BUN 25* 28*  CREATININE 1.36* 1.30*  CALCIUM 8.8*  --   AST 33  --   ALT 24  --   ALKPHOS 62  --   BILITOT 0.4  --    ------------------------------------------------------------------------------------------------------------------ No results for input(s): CHOL, HDL, LDLCALC, TRIG, CHOLHDL, LDLDIRECT in the last 72 hours.  No results found for: HGBA1C ------------------------------------------------------------------------------------------------------------------ Recent Labs    08/22/17 0333  TSH 8.269*   ------------------------------------------------------------------------------------------------------------------ No results for input(s): VITAMINB12, FOLATE, FERRITIN, TIBC, IRON, RETICCTPCT in the last 72 hours.  Coagulation profile Recent Labs  Lab 08/22/17 0333  INR 1.19    No results for input(s): DDIMER in the last 72 hours.  Cardiac Enzymes Recent Labs  Lab 08/22/17 0333 08/22/17 0538  TROPONINI <0.03 <0.03   ------------------------------------------------------------------------------------------------------------------    Component Value Date/Time   BNP 196.1 (H) 05/13/2017 1813    Inpatient  Medications  Scheduled Meds: . hydrocortisone sod succinate (SOLU-CORTEF) inj  100 mg Intravenous Q8H  . levothyroxine  65 mcg Intravenous Daily  . mometasone-formoterol  2 puff Inhalation BID  . sodium chloride flush  3 mL Intravenous Q12H  . tiotropium  18 mcg Inhalation Daily   Continuous Infusions: . sodium chloride 10 mL/hr at 08/22/17 0738  . ceFEPime (MAXIPIME) IV    . dextrose 100 mL/hr at 08/22/17 0604  . famotidine (PEPCID) IV    . metronidazole 500 mg (08/22/17 0943)  . vancomycin     PRN Meds:.acetaminophen **OR** acetaminophen, albuterol, dextrose, ondansetron **OR** ondansetron (ZOFRAN) IV  Micro Results No results found for this or any previous visit (from the past 240 hour(s)).  Radiology Reports Ct Head Wo Contrast  Result Date: 08/22/2017 CLINICAL DATA:  Altered mental status. Found on the floor. Posttraumatic headache. Hypoglycemia. EXAM: CT HEAD WITHOUT CONTRAST TECHNIQUE: Contiguous axial images were obtained from the base of the skull through the vertex without intravenous contrast. COMPARISON:  None. FINDINGS: Brain: Diffuse cerebral atrophy. Ventricular dilatation consistent with central atrophy. Low-attenuation changes throughout the  deep white matter consistent with small vessel ischemia. Diffusely prominent CSF space likely due to atrophy. No mass effect or midline shift. No abnormal extra-axial fluid collections. Gray-white matter junctions are distinct. Basal cisterns are not effaced. No acute intracranial hemorrhage. Vascular: Intracranial arterial vascular calcifications are present. Skull: Calvarium appears intact. Sinuses/Orbits: Paranasal sinuses and mastoid air cells are clear. Other: None. IMPRESSION: No acute intracranial abnormalities. Chronic atrophy and small vessel ischemic changes. Electronically Signed   By: Lucienne Capers M.D.   On: 08/22/2017 01:48   Dg Chest Portable 1 View  Result Date: 08/22/2017 CLINICAL DATA:  Hypoglycemia EXAM:  PORTABLE CHEST 1 VIEW COMPARISON:  07/04/2017 FINDINGS: Cardiac shadow is within normal limits. Postsurgical changes are noted. Left chest wall port is noted in satisfactory position. Aortic calcifications are noted. Multiple calcified hilar lymph nodes are seen. The lungs are clear bilaterally. Old rib fractures are noted on the right. IMPRESSION: Chronic changes without acute abnormality. Electronically Signed   By: Inez Catalina M.D.   On: 08/22/2017 01:06     Phillips Climes M.D on 08/22/2017 at 9:54 AM  Between 7am to 7pm - Pager - [phone]  After 7pm go to [email or web address] - password [facility]  [facility] -  Office  [phone]

## 2017-08-22 NOTE — Consult Note (Addendum)
Consultation Note Date: 08/22/2017   Patient Name: Kristin Peterson  DOB: [birth year]  MRN: [id number]  Age / Sex: 83 y.o., female  PCP: [facility] Referring Physician: Albertine Patricia, MD  Reason for Consultation: Establishing goals of care and Psychosocial/spiritual support  HPI/Patient Profile: 83 y.o. female  admitted on 08/22/2017 with a past medical history significant for dementia, coronary artery disease, paroxysmal atrial fibrillation on Eliquis, COPD, hypothyroidism, chronic kidney disease stage III, chronic diastolic CHF, and lung cancer, now presenting from her nursing facility after she was found down on the floor unresponsive with CBG 30.   Patient currently is a resident of [facility] for the past 3 years.  Spoke with staff at [facility] this morning and they speak to patient's son and high regards to his attentiveness and care of his mother.  They report the patient is still able to help herself with activities of daily living and participates in some activities at the facility.  Patient was found to be unresponsive on the floor at her residence last night, EMS was called and she was found to have CBG of 30.  She was treated with dextrose and transported to the ED.  Per EMR Upon arrival to the ED, patient is found to be hypothermic with temperature 33.9 C, saturating adequately on room air, and with vitals otherwise normal.   Chest x-ray is negative for acute findings and noncontrast head CT is negative for acute intracranial abnormality.  Chemistry panel is notable for creatinine 1.36, similar to priors.  CBC features a leukocytosis to 11,200 and a stable anemia with hemoglobin 10.3.  Urinalysis is unremarkable.  Troponin is normal.  Blood cultures were collected, warming blankets applied, and the patient was started on empiric vancomycin, cefepime, and Flagyl in the ED.    Continued intermittent episodes of hypothermia/ hypoglycemia with no clear etiology. Plaquenil on hold.  Her son tells me today that she has had continued cognitive decline over the past few years.  Family face treatment option decisions, advanced directive decisions and anticipatory care needs.   Clinical Assessment and Goals of Care:  This NP Wadie Lessen reviewed medical records, received report from team, assessed the patient and then meet at the patient's bedside along with her son/Kristin Peterson to discuss diagnosis, prognosis, GOC, EOL wishes disposition and options.  Discussed components of current medical situation and concept of overall failure to thrive in light of a dementia diagnosis.  Concept of  Palliative Care were discussed  A  discussion was had today regarding advanced directives.  Concepts specific to code status, artifical feeding and hydration, continued IV antibiotics and rehospitalization was had.  The difference between a aggressive medical intervention path  and a palliative comfort care path for this patient at this time was had.  Values and goals of care important to patient and family were attempted to be elicited.  MOST form introduced and Hard Choices booklet left for review.   Questions and concerns addressed.   Family encouraged to call  with questions or concerns.    PMT will continue to support holistically.   HCPOA    SUMMARY OF RECOMMENDATIONS    Code Status/Advance Care Planning:  DNR  Apparently there was some confusion last night regarding CODE STATUS however the son is very confident in the decision for the patient to remain a DNR and he wishes to honor his mother's advanced directive.   Additional Recommendations (Limitations, Scope, Preferences):  Full Scope Treatment--treat the treatable and hope for improvement.  Ultimate goal is return to baseline and back to her assisted living  Psycho-social/Spiritual:   Emotional support  offered.  Kristin Peterson shares his heavy responsibility to multiple elderly family members to include his wife's parents and his own father  Prognosis:   Unable to determine  Discharge Planning: To Be Determined      Primary Diagnoses: Present on Admission: . Stage IV squamous cell carcinoma of left lung ([place]) . Chronic obstructive pulmonary disease ([place]) . CAD (coronary artery disease) . PAF (paroxysmal atrial fibrillation) ([place]) . CKD (chronic kidney disease) stage 3, GFR 30-59 ml/min (HCC) . History of pulmonary embolus (PE) . Hypothermia . Hypoglycemia without diagnosis of diabetes mellitus . SIRS (systemic inflammatory response syndrome) (HCC) . Acute encephalopathy   I have reviewed the medical record, interviewed the patient and family, and examined the patient. The following aspects are pertinent.  Past Medical History:  Diagnosis Date  . Allergy   . Anxiety   . Arthritis   . CAD (coronary artery disease)   . Cataract   . COPD (chronic obstructive pulmonary disease) ([place])   . Dementia   . Depression   . Myocardial infarction ([place])   . Stage IV squamous cell carcinoma of left lung ([place]) 09/25/2016   Social History   Socioeconomic History  . Marital status: Divorced    Spouse name: Not on file  . Number of children: 1  . Years of education: Not on file  . Highest education level: Not on file  Occupational History  . Not on file  Social Needs  . Financial resource strain: Not on file  . Food insecurity:    Worry: Not on file    Inability: Not on file  . Transportation needs:    Medical: Not on file    Non-medical: Not on file  Tobacco Use  . Smoking status: Former Smoker    Packs/day: 1.00    Years: 40.00    Pack years: 40.00    Types: Cigarettes    Last attempt to quit: 03/22/1996    Years since quitting: 21.4  . Smokeless tobacco: Never Used  Substance and Sexual Activity  . Alcohol use: No  . Drug use: No  . Sexual activity: Not on file  Lifestyle    . Physical activity:    Days per week: Not on file    Minutes per session: Not on file  . Stress: Not on file  Relationships  . Social connections:    Talks on phone: Not on file    Gets together: Not on file    Attends religious service: Not on file    Active member of club or organization: Not on file    Attends meetings of clubs or organizations: Not on file    Relationship status: Not on file  Other Topics Concern  . Not on file  Social History Narrative  . Not on file   Family History  Problem Relation Age of Onset  . Parkinsonism Father  Scheduled Meds: . mometasone-formoterol  2 puff Inhalation BID  . sodium chloride flush  3 mL Intravenous Q12H  . tiotropium  18 mcg Inhalation Daily   Continuous Infusions: . sodium chloride 10 mL/hr at 08/22/17 0738  . ceFEPime (MAXIPIME) IV    . dextrose 100 mL/hr at 08/22/17 0604  . famotidine (PEPCID) IV    . metronidazole Stopped (08/22/17 0415)  . vancomycin     PRN Meds:.acetaminophen **OR** acetaminophen, albuterol, dextrose, ondansetron **OR** ondansetron (ZOFRAN) IV Medications Prior to Admission:  Prior to Admission medications   Medication Sig Start Date End Date Taking? Authorizing Provider  acetaminophen (TYLENOL) 650 MG CR tablet Take 650 mg by mouth every 6 (six) hours.    Yes [provider]  albuterol (PROVENTIL HFA;VENTOLIN HFA) 108 (90 Base) MCG/ACT inhaler Inhale 2 puffs into the lungs every 6 (six) hours as needed for wheezing or shortness of breath. 05/20/17  Yes Rai, Ripudeep K, MD  apixaban (ELIQUIS) 5 MG TABS tablet Take 1 tablet (5 mg total) by mouth 2 (two) times daily. 05/27/17  Yes Rai, Ripudeep K, MD  atorvastatin (LIPITOR) 40 MG tablet Take 1 tablet (40 mg total) by mouth daily. 10/04/16 10/04/17 Yes Lelon Perla, MD  benzonatate (TESSALON) 100 MG capsule Take 1 capsule (100 mg total) by mouth 3 (three) times daily as needed for cough. 05/20/17  Yes Rai, Ripudeep K, MD   budesonide-formoterol (SYMBICORT) 160-4.5 MCG/ACT inhaler Inhale 2 puffs into the lungs 2 (two) times daily.   Yes [provider]  clonazePAM (KLONOPIN) 0.5 MG tablet Take 1.5 tablets (0.75 mg total) by mouth at bedtime. And daily as needed for anxiety Patient taking differently: Take 0.75 mg by mouth See admin instructions. At bedtime and daily as needed for anxiety 05/20/17  Yes Rai, Ripudeep K, MD  Coenzyme Q-10 200 MG CAPS Take 200 mg by mouth 2 (two) times daily.    Yes [provider]  furosemide (LASIX) 40 MG tablet Take 1 tablet (40 mg total) by mouth daily. 05/20/17  Yes Rai, Ripudeep K, MD  gabapentin (NEURONTIN) 100 MG capsule Take 100 mg by mouth 3 (three) times daily.   Yes [provider]  guaifenesin (ROBITUSSIN) 100 MG/5ML syrup Take 100 mg by mouth 4 (four) times daily as needed for cough.   Yes [provider]  hydroxychloroquine (PLAQUENIL) 200 MG tablet Take 200 mg by mouth daily.    Yes [provider]  levothyroxine (SYNTHROID, LEVOTHROID) 88 MCG tablet Take 88 mcg by mouth daily before breakfast.   Yes [provider]  loperamide (IMODIUM A-D) 2 MG tablet Take 2 mg by mouth 4 (four) times daily as needed for diarrhea or loose stools.   Yes [provider]  loratadine (CLARITIN) 10 MG tablet Take 10 mg by mouth daily.   Yes [provider]  memantine (NAMENDA) 10 MG tablet Take 10 mg by mouth 2 (two) times daily.   Yes [provider]  metoprolol tartrate (LOPRESSOR) 25 MG tablet Take 25 mg by mouth daily.    Yes [provider]  mirtazapine (REMERON) 15 MG tablet Take 7.5 mg by mouth at bedtime.    Yes [provider]  pantoprazole (PROTONIX) 40 MG tablet Take 40 mg by mouth daily.   Yes [provider]  polyethylene glycol (MIRALAX / GLYCOLAX) packet Take 17 g by mouth daily as needed for mild constipation.   Yes [provider]  sodium chloride 1 g tablet Take  1  g by mouth 2 (two) times daily with a meal.   Yes [provider]  tiotropium (SPIRIVA) 18 MCG inhalation capsule Place 18 mcg into inhaler and inhale daily.   Yes [provider]  traMADol (ULTRAM) 50 MG tablet Take 1 tablet (50 mg total) by mouth 2 (two) times daily. 05/20/17  Yes Rai, Vernelle Emerald, MD   Allergies  Allergen Reactions  . Sulfa Antibiotics Other (See Comments)    unknown  . Contrast Media [Iodinated Diagnostic Agents] Itching and Rash   Review of Systems  Unable to perform ROS: Acuity of condition    Physical Exam  Constitutional: She appears well-developed.  Cardiovascular: Normal rate, regular rhythm and normal heart sounds.  Pulmonary/Chest: Effort normal and breath sounds normal.  Neurological: She is alert.  Skin: Skin is warm and dry.    Vital Signs: BP (!) 106/49   Pulse 75   Temp 99.1 F (37.3 C)   Resp 19   Ht 5\' 5"  (1.651 m)   Wt 54.4 kg   SpO2 100%   BMI 19.97 kg/m  Pain Scale: 0-10   Pain Score: 3    SpO2: SpO2: 100 % O2 Device:SpO2: 100 % O2 Flow Rate: .O2 Flow Rate (L/min): 2 L/min  IO: Intake/output summary:   Intake/Output Summary (Last 24 hours) at 08/22/2017 0858 Last data filed at 08/22/2017 0234 Gross per 24 hour  Intake 101.23 ml  Output -  Net 101.23 ml    LBM: Last BM Date: 08/22/17 Baseline Weight: Weight: 54.4 kg Most recent weight: Weight: 54.4 kg     Palliative Assessment/Data: 30 % currently   Discussed with Dr Waldron Labs.  This NP will follow up with family on Monday.  Family is encouraged to call with questions or concerns through the weekend for correspondence with the palliative medicine team providers, contact information given  Time In: 1300 Time Out: 1415 Time Total: 75 minutes Greater than 50%  of this time was spent counseling and coordinating care related to the above assessment and plan.  Signed by: Wadie Lessen, NP   Please contact Palliative Medicine Team phone at [phone] for  questions and concerns.  For individual provider: See Shea Evans

## 2017-08-22 NOTE — Progress Notes (Signed)
Hypoglycemic Event  CBG: 57  Treatment: D50 IV 25 mL  Symptoms: None  Follow-up CBG: Time:0758 CBG Result166  Possible Reasons for Event: Unknown   Comments/MD notified: Dr. Elgergawy     [place]

## 2017-08-22 NOTE — ED Triage Notes (Signed)
Pt BIB [facility] for eval of hypoglycemia, found incoherent on floor @ 2230 by staff at SNF ([facility] Sugar found to be 30 by fire, received 1 tube of oral glucose by fire and an amp of D50 by EMS. CBG on arrival 192. Pt appears extremely pale, lethargic and diaphoretic on arrival. Conversational on arrival.

## 2017-08-22 NOTE — ED Triage Notes (Signed)
Bair hugger applied for rectal temp of 93

## 2017-08-22 NOTE — Progress Notes (Signed)
Informed Dr. Waldron Labs about patients BS dropping several times overnight and requiring several doses of d50 to bring sugar back up. Per MD let him know when 2 more doses of d50 have been given because a glucagon drip will need to be started.

## 2017-08-22 NOTE — ED Provider Notes (Signed)
[facility] PROGRESSIVE CARE Provider Note   CSN: [id number] Arrival date & time: 08/22/17  0004     History   Chief Complaint Chief Complaint  Patient presents with  . Hypoglycemia    HPI Kristin Peterson is a 83 y.o. female.  The history is provided by the EMS personnel and a relative. The history is limited by the condition of the patient.  Hypoglycemia  Initial blood sugar:  30 Blood sugar after intervention:  192 Severity:  Severe Onset quality:  Unable to specify Timing:  Constant Progression:  Improving Chronicity:  New Diabetic status:  Non-diabetic Context: recent illness   Context: not decreased oral intake, not ingestion and not treatment noncompliance   Relieved by:  IV glucose Ineffective treatments:  None tried Associated symptoms: altered mental status   Risk factors: no alcohol abuse   Altered Mental Status   This is a new problem. The current episode started 1 to 2 hours ago. The problem has not changed since onset.Associated symptoms include somnolence. Pertinent negatives include no self-injury. Risk factors include a recent illness. Her past medical history is significant for dementia. Her past medical history does not include diabetes.    Past Medical History:  Diagnosis Date  . Allergy   . Anxiety   . Arthritis   . CAD (coronary artery disease)   . Cataract   . COPD (chronic obstructive pulmonary disease) ([place])   . Dementia   . Depression   . Myocardial infarction ([place])   . Stage IV squamous cell carcinoma of left lung ([place]) 09/25/2016    Patient Active Problem List   Diagnosis Date Noted  . Hypothermia 08/22/2017  . Hypoglycemia without diagnosis of diabetes mellitus 08/22/2017  . SIRS (systemic inflammatory response syndrome) ([place]) 08/22/2017  . Hypoglycemia 08/22/2017  . Acute encephalopathy 08/22/2017  . History of pulmonary embolus (PE) 05/27/2017  . Goals of care, counseling/discussion   . Palliative care by specialist   .  Acute respiratory failure with hypoxia ([place]) 05/14/2017  . CAD (coronary artery disease) 05/14/2017  . PAF (paroxysmal atrial fibrillation) ([place]) 05/14/2017  . CKD (chronic kidney disease) stage 3, GFR 30-59 ml/min (HCC) 05/14/2017  . Chronic obstructive pulmonary disease ([place]) 09/27/2016  . Stage IV squamous cell carcinoma of left lung ([place]) 09/25/2016    Past Surgical History:  Procedure Laterality Date  . ABDOMINAL HYSTERECTOMY    . CORONARY ARTERY BYPASS GRAFT    . OOPHORECTOMY    . TONSILLECTOMY       OB History   None      Home Medications    Prior to Admission medications   Medication Sig Start Date End Date Taking? Authorizing Provider  acetaminophen (TYLENOL) 650 MG CR tablet Take 650 mg by mouth every 6 (six) hours.    Yes [provider]  albuterol (PROVENTIL HFA;VENTOLIN HFA) 108 (90 Base) MCG/ACT inhaler Inhale 2 puffs into the lungs every 6 (six) hours as needed for wheezing or shortness of breath. 05/20/17  Yes Rai, Ripudeep K, MD  apixaban (ELIQUIS) 5 MG TABS tablet Take 1 tablet (5 mg total) by mouth 2 (two) times daily. 05/27/17  Yes Rai, Ripudeep K, MD  atorvastatin (LIPITOR) 40 MG tablet Take 1 tablet (40 mg total) by mouth daily. 10/04/16 10/04/17 Yes Lelon Perla, MD  benzonatate (TESSALON) 100 MG capsule Take 1 capsule (100 mg total) by mouth 3 (three) times daily as needed for cough. 05/20/17  Yes Rai, Vernelle Emerald, MD  budesonide-formoterol (SYMBICORT)  160-4.5 MCG/ACT inhaler Inhale 2 puffs into the lungs 2 (two) times daily.   Yes [provider]  clonazePAM (KLONOPIN) 0.5 MG tablet Take 1.5 tablets (0.75 mg total) by mouth at bedtime. And daily as needed for anxiety Patient taking differently: Take 0.75 mg by mouth See admin instructions. At bedtime and daily as needed for anxiety 05/20/17  Yes Rai, Ripudeep K, MD  Coenzyme Q-10 200 MG CAPS Take 200 mg by mouth 2 (two) times daily.    Yes [provider]  furosemide (LASIX) 40 MG  tablet Take 1 tablet (40 mg total) by mouth daily. 05/20/17  Yes Rai, Ripudeep K, MD  gabapentin (NEURONTIN) 100 MG capsule Take 100 mg by mouth 3 (three) times daily.   Yes [provider]  guaifenesin (ROBITUSSIN) 100 MG/5ML syrup Take 100 mg by mouth 4 (four) times daily as needed for cough.   Yes [provider]  hydroxychloroquine (PLAQUENIL) 200 MG tablet Take 200 mg by mouth daily.    Yes [provider]  levothyroxine (SYNTHROID, LEVOTHROID) 88 MCG tablet Take 88 mcg by mouth daily before breakfast.   Yes [provider]  loperamide (IMODIUM A-D) 2 MG tablet Take 2 mg by mouth 4 (four) times daily as needed for diarrhea or loose stools.   Yes [provider]  loratadine (CLARITIN) 10 MG tablet Take 10 mg by mouth daily.   Yes [provider]  memantine (NAMENDA) 10 MG tablet Take 10 mg by mouth 2 (two) times daily.   Yes [provider]  metoprolol tartrate (LOPRESSOR) 25 MG tablet Take 25 mg by mouth daily.    Yes [provider]  mirtazapine (REMERON) 15 MG tablet Take 7.5 mg by mouth at bedtime.    Yes [provider]  pantoprazole (PROTONIX) 40 MG tablet Take 40 mg by mouth daily.   Yes [provider]  polyethylene glycol (MIRALAX / GLYCOLAX) packet Take 17 g by mouth daily as needed for mild constipation.   Yes [provider]  sodium chloride 1 g tablet Take 1 g by mouth 2 (two) times daily with a meal.   Yes [provider]  tiotropium (SPIRIVA) 18 MCG inhalation capsule Place 18 mcg into inhaler and inhale daily.   Yes [provider]  traMADol (ULTRAM) 50 MG tablet Take 1 tablet (50 mg total) by mouth 2 (two) times daily. 05/20/17  Yes Rai, Vernelle Emerald, MD    Family History Family History  Problem Relation Age of Onset  . Parkinsonism Father     Social History Social History   Tobacco Use  . Smoking status: Former Smoker    Packs/day: 1.00    Years: 40.00     Pack years: 40.00    Types: Cigarettes    Last attempt to quit: 03/22/1996    Years since quitting: 21.4  . Smokeless tobacco: Never Used  Substance Use Topics  . Alcohol use: No  . Drug use: No     Allergies   Sulfa antibiotics and Contrast media [iodinated diagnostic agents]   Review of Systems Review of Systems  Unable to perform ROS: Mental status change  Neurological: Positive for syncope.  Psychiatric/Behavioral: Negative for self-injury.     Physical Exam Updated Vital Signs BP (!) 101/57 (BP Location: Right Arm)   Pulse 61   Temp (!) 94.8 F (34.9 C) (Bladder)   Resp 17   Ht 5\' 5"  (1.651 m)   Wt 54.4 kg   SpO2 100%  BMI 19.97 kg/m   Physical Exam  Constitutional: She appears well-developed and well-nourished.  HENT:  Head: Normocephalic and atraumatic.  Right Ear: External ear normal.  Left Ear: External ear normal.  Nose: Nose normal.  Mouth/Throat: Oropharynx is clear and moist.  Eyes: Pupils are equal, round, and reactive to light. Conjunctivae are normal.  Neck: Normal range of motion. Neck supple. No JVD present.  Cardiovascular: Normal rate, regular rhythm, normal heart sounds and intact distal pulses.  Pulmonary/Chest: Breath sounds normal. No respiratory distress. She has no wheezes. She has no rales.  Abdominal: Soft. Bowel sounds are normal. She exhibits no mass. There is no tenderness. There is no guarding.  Musculoskeletal: She exhibits no edema or tenderness.  Lymphadenopathy:    She has no cervical adenopathy.  Neurological: She displays normal reflexes. GCS eye subscore is 3. GCS verbal subscore is 4. GCS motor subscore is 5.  Skin: Skin is warm and dry. Capillary refill takes less than 2 seconds. She is not diaphoretic.     ED Treatments / Results  Labs (all labs ordered are listed, but only abnormal results are displayed) Results for orders placed or performed during the hospital encounter of 08/22/17  Comprehensive metabolic  panel  Result Value Ref Range   Sodium 142 135 - 145 mmol/L   Potassium 3.8 3.5 - 5.1 mmol/L   Chloride 105 98 - 111 mmol/L   CO2 29 22 - 32 mmol/L   Glucose, Bld 149 (H) 70 - 99 mg/dL   BUN 25 (H) 8 - 23 mg/dL   Creatinine, Ser 1.36 (H) 0.44 - 1.00 mg/dL   Calcium 8.8 (L) 8.9 - 10.3 mg/dL   Total Protein 6.1 (L) 6.5 - 8.1 g/dL   Albumin 3.5 3.5 - 5.0 g/dL   AST 33 15 - 41 U/L   ALT 24 0 - 44 U/L   Alkaline Phosphatase 62 38 - 126 U/L   Total Bilirubin 0.4 0.3 - 1.2 mg/dL   GFR calc non Af Amer 36 (L) >60 mL/min   GFR calc Af Amer 41 (L) >60 mL/min   Anion gap 8 5 - 15  CBC with Differential/Platelet  Result Value Ref Range   WBC 11.2 (H) 4.0 - 10.5 K/uL   RBC 3.25 (L) 3.87 - 5.11 MIL/uL   Hemoglobin 10.3 (L) 12.0 - 15.0 g/dL   HCT 34.0 (L) 36.0 - 46.0 %   MCV 104.6 (H) 78.0 - 100.0 fL   MCH 31.7 26.0 - 34.0 pg   MCHC 30.3 30.0 - 36.0 g/dL   RDW 14.1 11.5 - 15.5 %   Platelets 283 150 - 400 K/uL   Neutrophils Relative % 89 %   Neutro Abs 9.9 (H) 1.7 - 7.7 K/uL   Lymphocytes Relative 4 %   Lymphs Abs 0.4 (L) 0.7 - 4.0 K/uL   Monocytes Relative 6 %   Monocytes Absolute 0.6 0.1 - 1.0 K/uL   Eosinophils Relative 1 %   Eosinophils Absolute 0.1 0.0 - 0.7 K/uL   Basophils Relative 0 %   Basophils Absolute 0.1 0.0 - 0.1 K/uL   Immature Granulocytes 0 %   Abs Immature Granulocytes 0.1 0.0 - 0.1 K/uL  Urinalysis, Routine w reflex microscopic  Result Value Ref Range   Color, Urine YELLOW YELLOW   APPearance CLEAR CLEAR   Specific Gravity, Urine 1.011 1.005 - 1.030   pH 5.0 5.0 - 8.0   Glucose, UA NEGATIVE NEGATIVE mg/dL   Hgb urine dipstick NEGATIVE NEGATIVE  Bilirubin Urine NEGATIVE NEGATIVE   Ketones, ur NEGATIVE NEGATIVE mg/dL   Protein, ur NEGATIVE NEGATIVE mg/dL   Nitrite NEGATIVE NEGATIVE   Leukocytes, UA NEGATIVE NEGATIVE  CBG monitoring, ED  Result Value Ref Range   Glucose-Capillary 192 (H) 70 - 99 mg/dL  CBG monitoring, ED  Result Value Ref Range    Glucose-Capillary 111 (H) 70 - 99 mg/dL  I-stat chem 8, ed  Result Value Ref Range   Sodium 142 135 - 145 mmol/L   Potassium 3.6 3.5 - 5.1 mmol/L   Chloride 102 98 - 111 mmol/L   BUN 28 (H) 8 - 23 mg/dL   Creatinine, Ser 1.30 (H) 0.44 - 1.00 mg/dL   Glucose, Bld 143 (H) 70 - 99 mg/dL   Calcium, Ion 1.21 1.15 - 1.40 mmol/L   TCO2 28 22 - 32 mmol/L   Hemoglobin 10.5 (L) 12.0 - 15.0 g/dL   HCT 31.0 (L) 36.0 - 46.0 %  I-stat troponin, ED  Result Value Ref Range   Troponin i, poc 0.02 0.00 - 0.08 ng/mL   Comment 3          I-Stat CG4 Lactic Acid, ED  (not at  [facility])  Result Value Ref Range   Lactic Acid, Venous 1.44 0.5 - 1.9 mmol/L   Ct Head Wo Contrast  Result Date: 08/22/2017 CLINICAL DATA:  Altered mental status. Found on the floor. Posttraumatic headache. Hypoglycemia. EXAM: CT HEAD WITHOUT CONTRAST TECHNIQUE: Contiguous axial images were obtained from the base of the skull through the vertex without intravenous contrast. COMPARISON:  None. FINDINGS: Brain: Diffuse cerebral atrophy. Ventricular dilatation consistent with central atrophy. Low-attenuation changes throughout the deep white matter consistent with small vessel ischemia. Diffusely prominent CSF space likely due to atrophy. No mass effect or midline shift. No abnormal extra-axial fluid collections. Gray-white matter junctions are distinct. Basal cisterns are not effaced. No acute intracranial hemorrhage. Vascular: Intracranial arterial vascular calcifications are present. Skull: Calvarium appears intact. Sinuses/Orbits: Paranasal sinuses and mastoid air cells are clear. Other: None. IMPRESSION: No acute intracranial abnormalities. Chronic atrophy and small vessel ischemic changes. Electronically Signed   By: Lucienne Capers M.D.   On: 08/22/2017 01:48   Dg Chest Portable 1 View  Result Date: 08/22/2017 CLINICAL DATA:  Hypoglycemia EXAM: PORTABLE CHEST 1 VIEW COMPARISON:  07/04/2017 FINDINGS: Cardiac shadow is within normal  limits. Postsurgical changes are noted. Left chest wall port is noted in satisfactory position. Aortic calcifications are noted. Multiple calcified hilar lymph nodes are seen. The lungs are clear bilaterally. Old rib fractures are noted on the right. IMPRESSION: Chronic changes without acute abnormality. Electronically Signed   By: Inez Catalina M.D.   On: 08/22/2017 01:06    EKG EKG Interpretation  Date/Time:  Thursday August 22 2017 00:20:53 EDT Ventricular Rate:  62 PR Interval:    QRS Duration: 134 QT Interval:  515 QTC Calculation: 524 R Axis:   97 Text Interpretation:  Right and left arm electrode reversal, interpretation assumes no reversal Sinus or ectopic atrial rhythm Left bundle branch block Prolonged QT interval Confirmed by Dory Horn) on 08/22/2017 12:33:17 AM   Radiology Ct Head Wo Contrast  Result Date: 08/22/2017 CLINICAL DATA:  Altered mental status. Found on the floor. Posttraumatic headache. Hypoglycemia. EXAM: CT HEAD WITHOUT CONTRAST TECHNIQUE: Contiguous axial images were obtained from the base of the skull through the vertex without intravenous contrast. COMPARISON:  None. FINDINGS: Brain: Diffuse cerebral atrophy. Ventricular dilatation consistent with central atrophy. Low-attenuation changes throughout the  deep white matter consistent with small vessel ischemia. Diffusely prominent CSF space likely due to atrophy. No mass effect or midline shift. No abnormal extra-axial fluid collections. Gray-white matter junctions are distinct. Basal cisterns are not effaced. No acute intracranial hemorrhage. Vascular: Intracranial arterial vascular calcifications are present. Skull: Calvarium appears intact. Sinuses/Orbits: Paranasal sinuses and mastoid air cells are clear. Other: None. IMPRESSION: No acute intracranial abnormalities. Chronic atrophy and small vessel ischemic changes. Electronically Signed   By: Lucienne Capers M.D.   On: 08/22/2017 01:48   Dg Chest  Portable 1 View  Result Date: 08/22/2017 CLINICAL DATA:  Hypoglycemia EXAM: PORTABLE CHEST 1 VIEW COMPARISON:  07/04/2017 FINDINGS: Cardiac shadow is within normal limits. Postsurgical changes are noted. Left chest wall port is noted in satisfactory position. Aortic calcifications are noted. Multiple calcified hilar lymph nodes are seen. The lungs are clear bilaterally. Old rib fractures are noted on the right. IMPRESSION: Chronic changes without acute abnormality. Electronically Signed   By: Inez Catalina M.D.   On: 08/22/2017 01:06    Procedures Procedures (including critical care time)  Medications Ordered in ED Medications  metroNIDAZOLE (FLAGYL) IVPB 500 mg (500 mg Intravenous New Bag/Given 08/22/17 0236)  vancomycin (VANCOCIN) IVPB 1000 mg/200 mL premix (1,000 mg Intravenous New Bag/Given 08/22/17 0208)  tiotropium (SPIRIVA) inhalation capsule 18 mcg (has no administration in time range)  mometasone-formoterol (DULERA) 200-5 MCG/ACT inhaler 2 puff (has no administration in time range)  albuterol (PROVENTIL) (2.5 MG/3ML) 0.083% nebulizer solution 2.5 mg (has no administration in time range)  famotidine (PEPCID) IVPB 20 mg premix (has no administration in time range)  sodium chloride flush (NS) 0.9 % injection 3 mL (has no administration in time range)  0.9 %  sodium chloride infusion (has no administration in time range)  acetaminophen (TYLENOL) tablet 650 mg (has no administration in time range)    Or  acetaminophen (TYLENOL) suppository 650 mg (has no administration in time range)  ondansetron (ZOFRAN) tablet 4 mg (has no administration in time range)    Or  ondansetron (ZOFRAN) injection 4 mg (has no administration in time range)  sodium chloride 0.9 % bolus 1,000 mL (has no administration in time range)  ceFEPIme (MAXIPIME) 2 g in sodium chloride 0.9 % 100 mL IVPB (0 g Intravenous Stopped 08/22/17 0234)  naloxone (NARCAN) injection 0.4 mg (0.4 mg Intravenous Given 08/22/17 0045)     MDM Reviewed: previous chart, nursing note and vitals Interpretation: labs, ECG and x-ray (No PNA on CXR, negative troponin, normal lactate) Total time providing critical care: 75-105 minutes (multiple antibiotics initated for sepsis). This excludes time spent performing separately reportable procedures and services. Consults: admitting MD    Final Clinical Impressions(s) / ED Diagnoses   Final diagnoses:  Somnolence  Diarrhea, unspecified type  SIRS (systemic inflammatory response syndrome) ([place])  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)    Narcan did not improve patient's level of alertness.   Given history of heart failure will not give 30 cc/kg bolus as this will make patient worse.  Have started iv antibiotics.     Katlin Bortner, MD 08/22/17 [phone]

## 2017-08-22 NOTE — Progress Notes (Signed)
Pharmacy Antibiotic Note  Kristin Peterson is a 83 y.o. female admitted on 08/22/2017 with sepsis.  Pharmacy has been consulted for Vancomycin/Cefepime dosing. Pt found down at nursing facility with hypoglycemia. WBC 11.2. Noted bump in Scr.   Plan: Vancomycin 500 mg IV q24h Cefepime 1g IV q24h Trend WBC, temp, renal function  F/U infectious work-up Drug levels as indicated   Height: 5\' 5"  (165.1 cm) Weight: 120 lb (54.4 kg) IBW/kg (Calculated) : 57  Temp (24hrs), Avg:93.9 F (34.4 C), Min:93 F (33.9 C), Max:94.8 F (34.9 C)  Recent Labs  Lab 08/22/17 0019 08/22/17 0031 08/22/17 0034 08/22/17 0142  WBC  --  11.2*  --   --   CREATININE 1.36*  --  1.30*  --   LATICACIDVEN  --   --   --  1.44    Estimated Creatinine Clearance: 29.6 mL/min (A) (by C-G formula based on SCr of 1.3 mg/dL (H)).    Allergies  Allergen Reactions  . Sulfa Antibiotics Other (See Comments)    unknown  . Contrast Media [Iodinated Diagnostic Agents] Itching and Rash     Narda Bonds 08/22/2017 3:58 AM

## 2017-08-22 NOTE — Progress Notes (Signed)
Hypoglycemic Event  CBG:61  Treatment: D50 IV 25 mL  Symptoms: Shaky  Follow-up CBG: Time:1025 CBG Result: 159  Possible Reasons for Event: Unknown  Comments/MD notified: Dr; Elgergawy     [place] N Ngai Parcell

## 2017-08-22 NOTE — H&P (Signed)
History and Physical    NAILYN Peterson EHU:[id number] DOB: 12-Aug-1934 DOA: 08/22/2017  PCP: [facility]   Patient coming from: Nursing facility   Chief Complaint: Found down with hypoglycemia   HPI: Kristin Peterson is a 83 y.o. female with medical history significant for dementia, coronary artery disease, paroxysmal atrial fibrillation on Eliquis, COPD, hypothyroidism, chronic kidney disease stage III, chronic diastolic CHF, and lung cancer, now presenting from her nursing facility after she was found down on the floor unresponsive with CBG 30.  Patient was found to be unresponsive on the floor at her residence last night, EMS was called and she was found to have CBG of 30.  She was treated with dextrose and transported to the ED.  Patient is unable to contribute to the history due to her clinical condition.  She seemed to be in her usual state earlier in the day yesterday and had not complained of anything.  ED Course: Upon arrival to the ED, patient is found to be hypothermic with temperature 33.9 C, saturating adequately on room air, and with vitals otherwise normal.  EKG features a sinus or ectopic atrial rhythm with chronic LBBB and QTc interval of 524 ms.  Chest x-ray is negative for acute findings and noncontrast head CT is negative for acute intracranial abnormality.  Chemistry panel is notable for creatinine 1.36, similar to priors.  CBC features a leukocytosis to 11,200 and a stable anemia with hemoglobin 10.3.  Urinalysis is unremarkable.  Troponin is normal.  Blood cultures were collected, warming blankets applied, and the patient was started on empiric vancomycin, cefepime, and Flagyl in the ED.  Her son arrived in the ED, indicated that he was the power of attorney, rescinded her DNR while acknowledging that the patient herself did not want to be resuscitated, and left.  Patient remains somnolent, hemodynamically stable, in no apparent respiratory distress, and will be  admitted for ongoing evaluation and management.  Review of Systems:  Unable to complete ROS secondary to the patient's clinical condition.  Past Medical History:  Diagnosis Date  . Allergy   . Anxiety   . Arthritis   . CAD (coronary artery disease)   . Cataract   . COPD (chronic obstructive pulmonary disease) ([place])   . Dementia   . Depression   . Myocardial infarction ([place])   . Stage IV squamous cell carcinoma of left lung ([place]) 09/25/2016    Past Surgical History:  Procedure Laterality Date  . ABDOMINAL HYSTERECTOMY    . CORONARY ARTERY BYPASS GRAFT    . OOPHORECTOMY    . TONSILLECTOMY       reports that she quit smoking about 21 years ago. Her smoking use included cigarettes. She has a 40.00 pack-year smoking history. She has never used smokeless tobacco. She reports that she does not drink alcohol or use drugs.  Allergies  Allergen Reactions  . Sulfa Antibiotics Other (See Comments)    unknown  . Contrast Media [Iodinated Diagnostic Agents] Itching and Rash    Family History  Problem Relation Age of Onset  . Parkinsonism Father      Prior to Admission medications   Medication Sig Start Date End Date Taking? Authorizing Provider  acetaminophen (TYLENOL) 650 MG CR tablet Take 650 mg by mouth every 6 (six) hours.    Yes [provider]  albuterol (PROVENTIL HFA;VENTOLIN HFA) 108 (90 Base) MCG/ACT inhaler Inhale 2 puffs into the lungs every 6 (six) hours as needed for wheezing or shortness  of breath. 05/20/17  Yes Rai, Ripudeep K, MD  apixaban (ELIQUIS) 5 MG TABS tablet Take 1 tablet (5 mg total) by mouth 2 (two) times daily. 05/27/17  Yes Rai, Ripudeep K, MD  atorvastatin (LIPITOR) 40 MG tablet Take 1 tablet (40 mg total) by mouth daily. 10/04/16 10/04/17 Yes Lelon Perla, MD  benzonatate (TESSALON) 100 MG capsule Take 1 capsule (100 mg total) by mouth 3 (three) times daily as needed for cough. 05/20/17  Yes Rai, Ripudeep K, MD  budesonide-formoterol  (SYMBICORT) 160-4.5 MCG/ACT inhaler Inhale 2 puffs into the lungs 2 (two) times daily.   Yes [provider]  clonazePAM (KLONOPIN) 0.5 MG tablet Take 1.5 tablets (0.75 mg total) by mouth at bedtime. And daily as needed for anxiety Patient taking differently: Take 0.75 mg by mouth See admin instructions. At bedtime and daily as needed for anxiety 05/20/17  Yes Rai, Ripudeep K, MD  Coenzyme Q-10 200 MG CAPS Take 200 mg by mouth 2 (two) times daily.    Yes [provider]  furosemide (LASIX) 40 MG tablet Take 1 tablet (40 mg total) by mouth daily. 05/20/17  Yes Rai, Ripudeep K, MD  gabapentin (NEURONTIN) 100 MG capsule Take 100 mg by mouth 3 (three) times daily.   Yes [provider]  guaifenesin (ROBITUSSIN) 100 MG/5ML syrup Take 100 mg by mouth 4 (four) times daily as needed for cough.   Yes [provider]  hydroxychloroquine (PLAQUENIL) 200 MG tablet Take 200 mg by mouth daily.    Yes [provider]  levothyroxine (SYNTHROID, LEVOTHROID) 88 MCG tablet Take 88 mcg by mouth daily before breakfast.   Yes [provider]  loperamide (IMODIUM A-D) 2 MG tablet Take 2 mg by mouth 4 (four) times daily as needed for diarrhea or loose stools.   Yes [provider]  loratadine (CLARITIN) 10 MG tablet Take 10 mg by mouth daily.   Yes [provider]  memantine (NAMENDA) 10 MG tablet Take 10 mg by mouth 2 (two) times daily.   Yes [provider]  metoprolol tartrate (LOPRESSOR) 25 MG tablet Take 25 mg by mouth daily.    Yes [provider]  mirtazapine (REMERON) 15 MG tablet Take 7.5 mg by mouth at bedtime.    Yes [provider]  pantoprazole (PROTONIX) 40 MG tablet Take 40 mg by mouth daily.   Yes [provider]  polyethylene glycol (MIRALAX / GLYCOLAX) packet Take 17 g by mouth daily as needed for mild constipation.   Yes [provider]  sodium chloride 1 g tablet Take 1 g by mouth 2 (two)  times daily with a meal.   Yes [provider]  tiotropium (SPIRIVA) 18 MCG inhalation capsule Place 18 mcg into inhaler and inhale daily.   Yes [provider]  traMADol (ULTRAM) 50 MG tablet Take 1 tablet (50 mg total) by mouth 2 (two) times daily. 05/20/17  Yes Mendel Corning, MD    Physical Exam: Vitals:   08/22/17 0012 08/22/17 0014 08/22/17 0200 08/22/17 0211  BP: (!) 117/58  (!) 101/57 (!) 101/57  Pulse: 62  61 61  Resp: 17  16 17   Temp: (S) (!) 93 F (33.9 C)   (!) 94.8 F (34.9 C)  TempSrc: Rectal   Bladder  SpO2: 100%  100% 100%  Weight:  54.4 kg    Height:  5\' 5"  (1.651 m)        Constitutional: NAD, somnolent   Eyes: PERTLA,  lids and conjunctivae normal ENMT: Mucous membranes are moist. Posterior pharynx clear of any exudate or lesions.   Neck: normal, supple, no masses, no thyromegaly Respiratory: clear to auscultation bilaterally, no wheezing, no crackles. Normal respiratory effort.    Cardiovascular: S1 & S2 heard, regular rate and rhythm. No extremity edema. No significant JVD. Abdomen: No distension, no tenderness, soft. Bowel sounds normal.  Musculoskeletal: no clubbing / cyanosis. No joint deformity upper and lower extremities.   Skin: Cool. Ecchymosis to lower legs. No rashes noted. Neurologic: CN 2-12 grossly intact. Moving all extremities spontaneously. Somnolent, wakes and makes brief eye-contact but not answering simple questions.    Labs on Admission: I have personally reviewed following labs and imaging studies  CBC: Recent Labs  Lab 08/22/17 0031 08/22/17 0034  WBC 11.2*  --   NEUTROABS 9.9*  --   HGB 10.3* 10.5*  HCT 34.0* 31.0*  MCV 104.6*  --   PLT 283  --    Basic Metabolic Panel: Recent Labs  Lab 08/22/17 0019 08/22/17 0034  NA 142 142  K 3.8 3.6  CL 105 102  CO2 29  --   GLUCOSE 149* 143*  BUN 25* 28*  CREATININE 1.36* 1.30*  CALCIUM 8.8*  --    GFR: Estimated Creatinine Clearance: 29.6 mL/min (A) (by  C-G formula based on SCr of 1.3 mg/dL (H)). Liver Function Tests: Recent Labs  Lab 08/22/17 0019  AST 33  ALT 24  ALKPHOS 62  BILITOT 0.4  PROT 6.1*  ALBUMIN 3.5   No results for input(s): LIPASE, AMYLASE in the last 168 hours. No results for input(s): AMMONIA in the last 168 hours. Coagulation Profile: No results for input(s): INR, PROTIME in the last 168 hours. Cardiac Enzymes: No results for input(s): CKTOTAL, CKMB, CKMBINDEX, TROPONINI in the last 168 hours. BNP (last 3 results) No results for input(s): PROBNP in the last 8760 hours. HbA1C: No results for input(s): HGBA1C in the last 72 hours. CBG: Recent Labs  Lab 08/22/17 0007 08/22/17 0047  GLUCAP 192* 111*   Lipid Profile: No results for input(s): CHOL, HDL, LDLCALC, TRIG, CHOLHDL, LDLDIRECT in the last 72 hours. Thyroid Function Tests: No results for input(s): TSH, T4TOTAL, FREET4, T3FREE, THYROIDAB in the last 72 hours. Anemia Panel: No results for input(s): VITAMINB12, FOLATE, FERRITIN, TIBC, IRON, RETICCTPCT in the last 72 hours. Urine analysis:    Component Value Date/Time   COLORURINE YELLOW 08/22/2017 0031   APPEARANCEUR CLEAR 08/22/2017 0031   LABSPEC 1.011 08/22/2017 0031   PHURINE 5.0 08/22/2017 0031   GLUCOSEU NEGATIVE 08/22/2017 0031   HGBUR NEGATIVE 08/22/2017 0031   BILIRUBINUR NEGATIVE 08/22/2017 0031   KETONESUR NEGATIVE 08/22/2017 0031   PROTEINUR NEGATIVE 08/22/2017 0031   NITRITE NEGATIVE 08/22/2017 0031   LEUKOCYTESUR NEGATIVE 08/22/2017 0031   Sepsis Labs: @LABRCNTIP (procalcitonin:4,lacticidven:4) )No results found for this or any previous visit (from the past 240 hour(s)).   Radiological Exams on Admission: Ct Head Wo Contrast  Result Date: 08/22/2017 CLINICAL DATA:  Altered mental status. Found on the floor. Posttraumatic headache. Hypoglycemia. EXAM: CT HEAD WITHOUT CONTRAST TECHNIQUE: Contiguous axial images were obtained from the base of the skull through the vertex without  intravenous contrast. COMPARISON:  None. FINDINGS: Brain: Diffuse cerebral atrophy. Ventricular dilatation consistent with central atrophy. Peterson-attenuation changes throughout the deep white matter consistent with small vessel ischemia. Diffusely prominent CSF space likely due to atrophy. No mass effect or midline shift. No abnormal extra-axial fluid collections. Gray-white matter junctions are distinct. Basal cisterns  are not effaced. No acute intracranial hemorrhage. Vascular: Intracranial arterial vascular calcifications are present. Skull: Calvarium appears intact. Sinuses/Orbits: Paranasal sinuses and mastoid air cells are clear. Other: None. IMPRESSION: No acute intracranial abnormalities. Chronic atrophy and small vessel ischemic changes. Electronically Signed   By: Lucienne Capers M.D.   On: 08/22/2017 01:48   Dg Chest Portable 1 View  Result Date: 08/22/2017 CLINICAL DATA:  Hypoglycemia EXAM: PORTABLE CHEST 1 VIEW COMPARISON:  07/04/2017 FINDINGS: Cardiac shadow is within normal limits. Postsurgical changes are noted. Left chest wall port is noted in satisfactory position. Aortic calcifications are noted. Multiple calcified hilar lymph nodes are seen. The lungs are clear bilaterally. Old rib fractures are noted on the right. IMPRESSION: Chronic changes without acute abnormality. Electronically Signed   By: Inez Catalina M.D.   On: 08/22/2017 01:06    EKG: Independently reviewed. Sinus or ectopic atrial rhythm, chronic LBBB, QTc 524 ms.   Assessment/Plan   1. Hypoglycemia  - Patient was found down at her nursing facility with CBG 30, treated in the field with dextrose  - She does not have hx of diabetes  - She takes Plaquenil which has been associated with severe hypoglycemia in patients not on antidiabetic agents   - Most likely secondary to Plaquenil or sepsis, though Ddx is broad  - Hold Plaquenil, continue empiric antibiotics while following cultures, check cortisol, TSH, insulin,  pro-insulin, c-peptide, beta-hydroxybutyrate, sulfonylurea panel - Check frequent CBG's and start dextrose fluids if recurs    2. Hypothermia  - Temp 33.9 on arrival to ED  - Likely secondary to hypoglycemia  - Continue warming blankets, check sugars, continue antibiotics for now   3. SIRS  - There is hypothermia and leukocytosis on admission  - UA and CXR reviewed and not suggestive of infection, abd exam is benign, no meningismus, no infected wounds identified  - Blood cultures were collected in ED and she was started on empiric broad spectrum antibiotics  - Etiology of hypoglycemia not yet identified and sepsis remains on differential, so plan to continue empiric antibiotics for now    4. Acute encephalopathy  - Head CT is negative for acute findings  - Likely secondary to hypothermia, reassess after warmed  - Unclear how long she was on the floor, will check CK   5. Stage IV adenocarcinoma of lung  - Diagnosed in 2015 and treated with chemotherapy until May 2016 when it was stopped d/t intolerance   - She continues to follow with oncology for observation - No evidence for recurrence on recent CT, planned for repeat scan in 6 months    6. Paroxysmal atrial fibrillation  - CHADS-VASc at least 5 (age x2, gender, CHF, CAD)  - Resume Eliquis once more alert; consider parenteral anticoagulation if she remains NPO   7. CAD - Hx of CABG, chronic LBBB - Troponin is normal in ED  - Resume statin once she is more appropriate for oral intake    8. COPD  - No wheezing on admission  - Continue ICS/LABA, Spiriva, and prn albuterol    9. CKD stage III  - SCr is 1.36 on admission, consistent with her baseline  - Renally-dose medications   10. Chronic diastolic CHF  - Appears compensated  - Hydrating gently in setting of possible infection and while NPO d/t depressed level of consciousness  - Hold Lasix, follow I/O's and daily wts    DVT prophylaxis: Eliquis pta  Code Status: Full    Family Communication: Son  updated  Consults called: None  Admission status: Inpatient     Vianne Bulls, MD [facility] Pager [phone]  If 7PM-7AM, please contact night-coverage [email or web address] Password [facility]  08/22/2017, 2:39 AM

## 2017-08-23 DIAGNOSIS — J441 Chronic obstructive pulmonary disease with (acute) exacerbation: Secondary | ICD-10-CM

## 2017-08-23 DIAGNOSIS — G9340 Encephalopathy, unspecified: Secondary | ICD-10-CM

## 2017-08-23 LAB — GLUCOSE, CAPILLARY
GLUCOSE-CAPILLARY: 123 mg/dL — AB (ref 70–99)
GLUCOSE-CAPILLARY: 156 mg/dL — AB (ref 70–99)
GLUCOSE-CAPILLARY: 167 mg/dL — AB (ref 70–99)
GLUCOSE-CAPILLARY: 214 mg/dL — AB (ref 70–99)
Glucose-Capillary: 119 mg/dL — ABNORMAL HIGH (ref 70–99)
Glucose-Capillary: 120 mg/dL — ABNORMAL HIGH (ref 70–99)
Glucose-Capillary: 126 mg/dL — ABNORMAL HIGH (ref 70–99)
Glucose-Capillary: 140 mg/dL — ABNORMAL HIGH (ref 70–99)
Glucose-Capillary: 162 mg/dL — ABNORMAL HIGH (ref 70–99)
Glucose-Capillary: 265 mg/dL — ABNORMAL HIGH (ref 70–99)

## 2017-08-23 LAB — CBC
HCT: 31.7 % — ABNORMAL LOW (ref 36.0–46.0)
Hemoglobin: 9.9 g/dL — ABNORMAL LOW (ref 12.0–15.0)
MCH: 31.6 pg (ref 26.0–34.0)
MCHC: 31.2 g/dL (ref 30.0–36.0)
MCV: 101.3 fL — AB (ref 78.0–100.0)
PLATELETS: 276 10*3/uL (ref 150–400)
RBC: 3.13 MIL/uL — AB (ref 3.87–5.11)
RDW: 14.2 % (ref 11.5–15.5)
WBC: 12 10*3/uL — ABNORMAL HIGH (ref 4.0–10.5)

## 2017-08-23 LAB — BASIC METABOLIC PANEL
Anion gap: 10 (ref 5–15)
BUN: 10 mg/dL (ref 8–23)
CHLORIDE: 110 mmol/L (ref 98–111)
CO2: 22 mmol/L (ref 22–32)
Calcium: 8.9 mg/dL (ref 8.9–10.3)
Creatinine, Ser: 1.1 mg/dL — ABNORMAL HIGH (ref 0.44–1.00)
GFR calc Af Amer: 53 mL/min — ABNORMAL LOW (ref >60)
GFR, EST NON AFRICAN AMERICAN: 46 mL/min — AB (ref >60)
Glucose, Bld: 116 mg/dL — ABNORMAL HIGH (ref 70–99)
POTASSIUM: 3.5 mmol/L (ref 3.5–5.1)
Sodium: 142 mmol/L (ref 135–145)

## 2017-08-23 LAB — C-PEPTIDE: C-Peptide: 24.4 ng/mL — ABNORMAL HIGH (ref 1.1–4.4)

## 2017-08-23 MED ORDER — ATORVASTATIN CALCIUM 40 MG PO TABS
40.0000 mg | ORAL_TABLET | Freq: Every day | ORAL | Status: DC
  Administered 2017-08-23 – 2017-08-26 (×4): 40 mg via ORAL
  Filled 2017-08-23 (×5): qty 1

## 2017-08-23 MED ORDER — LORAZEPAM 2 MG/ML IJ SOLN
0.5000 mg | INTRAMUSCULAR | Status: DC | PRN
  Administered 2017-08-23 – 2017-08-25 (×2): 0.5 mg via INTRAVENOUS
  Filled 2017-08-23: qty 1

## 2017-08-23 MED ORDER — VANCOMYCIN HCL IN DEXTROSE 750-5 MG/150ML-% IV SOLN: 750.0000 mg | INTRAVENOUS | Status: DC

## 2017-08-23 MED ORDER — POTASSIUM CHLORIDE CRYS ER 20 MEQ PO TBCR
30.0000 meq | EXTENDED_RELEASE_TABLET | ORAL | Status: AC
  Administered 2017-08-23 (×2): 30 meq via ORAL
  Filled 2017-08-23 (×2): qty 1

## 2017-08-23 MED ORDER — LORAZEPAM 2 MG/ML IJ SOLN
INTRAMUSCULAR | Status: AC
  Administered 2017-08-23: 0.5 mg via INTRAVENOUS
  Filled 2017-08-23: qty 1

## 2017-08-23 MED ORDER — METOPROLOL TARTRATE 25 MG PO TABS
25.0000 mg | ORAL_TABLET | Freq: Every day | ORAL | Status: DC
Start: 2017-08-23 — End: 2017-08-27
  Administered 2017-08-23 – 2017-08-26 (×4): 25 mg via ORAL
  Filled 2017-08-23 (×4): qty 1

## 2017-08-23 MED ORDER — LEVOTHYROXINE SODIUM 112 MCG PO TABS
112.0000 ug | ORAL_TABLET | Freq: Every day | ORAL | Status: DC
  Administered 2017-08-23 – 2017-08-26 (×4): 112 ug via ORAL
  Filled 2017-08-23 (×4): qty 1

## 2017-08-23 MED ORDER — IPRATROPIUM-ALBUTEROL 0.5-2.5 (3) MG/3ML IN SOLN
3.0000 mL | Freq: Four times a day (QID) | RESPIRATORY_TRACT | Status: DC
  Administered 2017-08-23 – 2017-08-24 (×6): 3 mL via RESPIRATORY_TRACT
  Filled 2017-08-23 (×6): qty 3

## 2017-08-23 MED ORDER — HALOPERIDOL LACTATE 5 MG/ML IJ SOLN: 2.0000 mg | Freq: Four times a day (QID) | INTRAMUSCULAR | Status: DC | PRN

## 2017-08-23 MED ORDER — METHYLPREDNISOLONE SODIUM SUCC 125 MG IJ SOLR
80.0000 mg | Freq: Three times a day (TID) | INTRAMUSCULAR | Status: DC
  Administered 2017-08-23 – 2017-08-24 (×4): 80 mg via INTRAVENOUS
  Filled 2017-08-23 (×4): qty 2

## 2017-08-23 MED ORDER — LIP MEDEX EX OINT
TOPICAL_OINTMENT | CUTANEOUS | Status: DC | PRN
  Filled 2017-08-23: qty 7

## 2017-08-23 MED ORDER — PANTOPRAZOLE SODIUM 40 MG PO TBEC
40.0000 mg | DELAYED_RELEASE_TABLET | Freq: Every day | ORAL | Status: DC
  Administered 2017-08-23 – 2017-08-26 (×4): 40 mg via ORAL
  Filled 2017-08-23 (×4): qty 1

## 2017-08-23 MED ORDER — FUROSEMIDE 10 MG/ML IJ SOLN
40.0000 mg | Freq: Once | INTRAMUSCULAR | Status: AC
  Administered 2017-08-23: 40 mg via INTRAVENOUS
  Filled 2017-08-23: qty 4

## 2017-08-23 MED ORDER — IPRATROPIUM-ALBUTEROL 0.5-2.5 (3) MG/3ML IN SOLN
3.0000 mL | RESPIRATORY_TRACT | Status: DC
  Administered 2017-08-23: 3 mL via RESPIRATORY_TRACT
  Filled 2017-08-23: qty 3

## 2017-08-23 NOTE — Progress Notes (Signed)
PROGRESS NOTE                                                                                                                                                                                                             Patient Demographics:    Kristin Peterson, is a 83 y.o. female, DOB - 02-Aug-1934, JOI:[id number]  Admit date - 08/22/2017   Admitting Physician Vianne Bulls, MD  Outpatient Primary MD for the patient is [facility]  LOS - 1   Chief Complaint  Patient presents with  . Hypoglycemia       Brief Narrative    83 y.o. female with medical history significant for dementia, coronary artery disease, paroxysmal atrial fibrillation on Eliquis, COPD, hypothyroidism, chronic kidney disease stage III, chronic diastolic CHF, and lung cancer, now presenting from her nursing facility after she was found down on the floor unresponsive with CBG 30, and hypothermia.  Admitted to stepdown unit, started on dextrose drip, kept in the morning blanket, hypothermia improved within the first day, she had recurrent hypoglycemia and despite dextrose drip required D50 pushes.    Subjective:    Kristin Peterson today with some confusion overnight, this morning she reports some dyspnea, actually she has some wheezing .   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)   Chronic obstructive pulmonary disease (HCC)   CAD (coronary artery disease)   PAF (paroxysmal atrial fibrillation) (HCC)   CKD (chronic kidney disease) stage 3, GFR 30-59 ml/min (HCC)   History of pulmonary embolus (PE)   Hypothermia   SIRS (systemic inflammatory response syndrome) (HCC)   Acute encephalopathy   Somnolence   DNR (do not resuscitate)   Hypothermia/hypoglycemia  -So far no clear etiology could be identified, infectious etiology is a  possibility, but urine analysis and chest x-ray not suggestive of infection, abd exam is benign, no meningismus, no infected wounds identified , he is empirically on vancomycin, cefepime and Flagyl, given negative blood culture and MRSA PCR screening negative, will discontinue vancomycin today. -Her cortisol level is 13, within normal range, but may be low for her current stress, she was started empirically on IV hydrocortisone, has been switched to IV Solu-Medrol 08/23/2017 given  COPD exacerbation. -Please see discussion below regarding hypothyroidism as it may be contributing. -Continue to hold Plaquenil it may be causing hypoglycemia. -Initially requiring bear hugger, hypothermia resolved - Continue with D10 W, initially requiring as needed D50 pushes for hypoglycemia, overall it has been improving, decrease to 75 cc -Follow on insulin, pro-insulin, c-peptide,  sulfonylurea panel  SIRS  - There is hypothermia and leukocytosis on admission  - UA and CXR reviewed and not suggestive of infection, - Blood cultures were collected in ED and she was started on empiric broad spectrum antibiotics , please see above discussion, has stopped vancomycin    Acute encephalopathy  - Head CT is negative for acute findings  - Was initially lethargic, but this has improved after normalizing of her temperature, she is confused this morning, this is most likely in the setting of hospital delirium  COPD exacerbation -He is a significant wheezing this morning, has no requirement of oxygen, started on scheduled duo nebs, IV steroids. - Continue ICS/LABA, Spiriva, and prn albuterol    Hypothyroidism -TSH elevated at 8.2, free T4 is low at 0.5, she is on Synthroid 88 mcg daily, increased hair Synthroid to 112 mcg dailyStage IV adenocarcinoma of lung  - Diagnosed in 2015 and treated with chemotherapy until May 2016 when it was stopped d/t intolerance   - She continues to follow with oncology for observation - No  evidence for recurrence on recent CT, planned for repeat scan in 6 months    Paroxysmal atrial fibrillation  - CHADS-VASc at least 5 (age x2, gender, CHF, CAD)  - Continue with Eliquis, dose adjusted,  CAD - Hx of CABG, chronic LBBB - Troponin is normal in ED  - Resume statin once she is more appropriate for oral intake    CKD stage III  -Baseline - Renally-dose medications   Chronic diastolic CHF  - Appears compensated , she is on aggressive IV fluids for hypoglycemia, I will give 1 dose of Lasix today, dose as needed.    Code Status : DNR  Family Communication  : Discussed with son 08/22/2017  Disposition Plan  : pending further work-up  Consults  :  none  Procedures  : none  DVT Prophylaxis  :  On Eliquis  Lab Results  Component Value Date   PLT 276 08/23/2017    Antibiotics  :    Anti-infectives (From admission, onward)   Start     Dose/Rate Route Frequency Ordered Stop   08/23/17 2300  vancomycin (VANCOCIN) IVPB 750 mg/150 ml premix  Status:  Discontinued     750 mg 150 mL/hr over 60 Minutes Intravenous Every 24 hours 08/23/17 0741 08/23/17 0836   08/22/17 2200  vancomycin (VANCOCIN) 500 mg in sodium chloride 0.9 % 100 mL IVPB  Status:  Discontinued     500 mg 100 mL/hr over 60 Minutes Intravenous Every 24 hours 08/22/17 0401 08/23/17 0741   08/22/17 2200  ceFEPIme (MAXIPIME) 1 g in sodium chloride 0.9 % 100 mL IVPB     1 g 200 mL/hr over 30 Minutes Intravenous Every 24 hours 08/22/17 0401     08/22/17 0115  ceFEPIme (MAXIPIME) 2 g in sodium chloride 0.9 % 100 mL IVPB     2 g 200 mL/hr over 30 Minutes Intravenous  Once 08/22/17 0105 08/22/17 0234   08/22/17 0115  metroNIDAZOLE (FLAGYL) IVPB 500 mg     500 mg 100 mL/hr over 60 Minutes Intravenous Every 8 hours 08/22/17 0105  08/22/17 0115  vancomycin (VANCOCIN) IVPB 1000 mg/200 mL premix     1,000 mg 200 mL/hr over 60 Minutes Intravenous  Once 08/22/17 0105 08/22/17 0416        Objective:    Vitals:   08/23/17 0000 08/23/17 0400 08/23/17 0450 08/23/17 0831  BP:   (!) 158/73 (!) 136/115  Pulse: 90 81 (!) 101 (!) 105  Resp: 19 17 (!) 23 17  Temp: 99.3 F (37.4 C) 99.3 F (37.4 C) 99.1 F (37.3 C)   TempSrc:   Bladder   SpO2: 96% 100% 99% 98%  Weight:      Height:        Wt Readings from Last 3 Encounters:  08/22/17 54.4 kg  07/09/17 49.6 kg  06/04/17 48.6 kg     Intake/Output Summary (Last 24 hours) at 08/23/2017 0839 Last data filed at 08/23/2017 0646 Gross per 24 hour  Intake 2976.73 ml  Output 1675 ml  Net 1301.73 ml     Physical Exam  More awake today, confused, trying to get out of bed, alert x1  Has diminished air entry bilaterally, with diffuse wheezing, but no use of accessory muscles . Regular rate and rhythm, no rubs murmurs gallops +ve B.Sounds, Abd Soft, No tenderness,  No rebound - guarding or rigidity. Extremities with no edema, clubbing or cyanosis    Data Review:    CBC Recent Labs  Lab 08/22/17 0031 08/22/17 0034 08/23/17 0516  WBC 11.2*  --  12.0*  HGB 10.3* 10.5* 9.9*  HCT 34.0* 31.0* 31.7*  PLT 283  --  276  MCV 104.6*  --  101.3*  MCH 31.7  --  31.6  MCHC 30.3  --  31.2  RDW 14.1  --  14.2  LYMPHSABS 0.4*  --   --   MONOABS 0.6  --   --   EOSABS 0.1  --   --   BASOSABS 0.1  --   --     Chemistries  Recent Labs  Lab 08/22/17 0019 08/22/17 0034 08/23/17 0516  NA 142 142 142  K 3.8 3.6 3.5  CL 105 102 110  CO2 29  --  22  GLUCOSE 149* 143* 116*  BUN 25* 28* 10  CREATININE 1.36* 1.30* 1.10*  CALCIUM 8.8*  --  8.9  AST 33  --   --   ALT 24  --   --   ALKPHOS 62  --   --   BILITOT 0.4  --   --    ------------------------------------------------------------------------------------------------------------------ No results for input(s): CHOL, HDL, LDLCALC, TRIG, CHOLHDL, LDLDIRECT in the last 72 hours.  No results found for:  HGBA1C ------------------------------------------------------------------------------------------------------------------ Recent Labs    08/22/17 0333  TSH 8.269*   ------------------------------------------------------------------------------------------------------------------ No results for input(s): VITAMINB12, FOLATE, FERRITIN, TIBC, IRON, RETICCTPCT in the last 72 hours.  Coagulation profile Recent Labs  Lab 08/22/17 0333  INR 1.19    No results for input(s): DDIMER in the last 72 hours.  Cardiac Enzymes Recent Labs  Lab 08/22/17 0333 08/22/17 0538 08/22/17 1454  TROPONINI <0.03 <0.03 <0.03   ------------------------------------------------------------------------------------------------------------------    Component Value Date/Time   BNP 196.1 (H) 05/13/2017 1813    Inpatient Medications  Scheduled Meds: . apixaban  2.5 mg Oral BID  . furosemide  40 mg Intravenous Once  . ipratropium-albuterol  3 mL Nebulization Q4H  . levothyroxine  112 mcg Oral QAC breakfast  . methylPREDNISolone (SOLU-MEDROL) injection  80 mg Intravenous Q8H  . mometasone-formoterol  2  puff Inhalation BID  . pantoprazole  40 mg Oral Daily  . potassium chloride  30 mEq Oral Q4H  . sodium chloride flush  3 mL Intravenous Q12H  . tiotropium  18 mcg Inhalation Daily   Continuous Infusions: . sodium chloride 10 mL/hr at 08/23/17 0600  . ceFEPime (MAXIPIME) IV Stopped (08/23/17 0109)  . dextrose Stopped (08/23/17 0431)  . metronidazole Stopped (08/23/17 0453)   PRN Meds:.acetaminophen **OR** acetaminophen, albuterol, dextrose, ondansetron **OR** ondansetron (ZOFRAN) IV  Micro Results Recent Results (from the past 240 hour(s))  MRSA PCR Screening     Status: None   Collection Time: 08/22/17  6:24 AM  Result Value Ref Range Status   MRSA by PCR NEGATIVE NEGATIVE Final    Comment:        The GeneXpert MRSA Assay (FDA approved for NASAL specimens only), is one component of  a comprehensive MRSA colonization surveillance program. It is not intended to diagnose MRSA infection nor to guide or monitor treatment for MRSA infections. Performed at [facility], [place] [address] [place], [place] [address]   Gastrointestinal Panel by PCR , Stool     Status: None   Collection Time: 08/22/17  6:24 AM  Result Value Ref Range Status   Campylobacter species NOT DETECTED NOT DETECTED Final   Plesimonas shigelloides NOT DETECTED NOT DETECTED Final   Salmonella species NOT DETECTED NOT DETECTED Final   Yersinia enterocolitica NOT DETECTED NOT DETECTED Final   Vibrio species NOT DETECTED NOT DETECTED Final   Vibrio cholerae NOT DETECTED NOT DETECTED Final   Enteroaggregative E coli (EAEC) NOT DETECTED NOT DETECTED Final   Enteropathogenic E coli (EPEC) NOT DETECTED NOT DETECTED Final   Enterotoxigenic E coli (ETEC) NOT DETECTED NOT DETECTED Final   Shiga like toxin producing E coli (STEC) NOT DETECTED NOT DETECTED Final   Shigella/Enteroinvasive E coli (EIEC) NOT DETECTED NOT DETECTED Final   Cryptosporidium NOT DETECTED NOT DETECTED Final   Cyclospora cayetanensis NOT DETECTED NOT DETECTED Final   Entamoeba histolytica NOT DETECTED NOT DETECTED Final   Giardia lamblia NOT DETECTED NOT DETECTED Final   Adenovirus F40/41 NOT DETECTED NOT DETECTED Final   Astrovirus NOT DETECTED NOT DETECTED Final   Norovirus GI/GII NOT DETECTED NOT DETECTED Final   Rotavirus A NOT DETECTED NOT DETECTED Final   Sapovirus (I, II, IV, and V) NOT DETECTED NOT DETECTED Final    Comment: Performed at [facility], [address], [place],  [address]    Radiology Reports Ct Head Wo Contrast  Result Date: 08/22/2017 CLINICAL DATA:  Altered mental status. Found on the floor. Posttraumatic headache. Hypoglycemia. EXAM: CT HEAD WITHOUT CONTRAST TECHNIQUE: Contiguous axial images were obtained from the base of the skull through the vertex without intravenous contrast.  COMPARISON:  None. FINDINGS: Brain: Diffuse cerebral atrophy. Ventricular dilatation consistent with central atrophy. Low-attenuation changes throughout the deep white matter consistent with small vessel ischemia. Diffusely prominent CSF space likely due to atrophy. No mass effect or midline shift. No abnormal extra-axial fluid collections. Gray-white matter junctions are distinct. Basal cisterns are not effaced. No acute intracranial hemorrhage. Vascular: Intracranial arterial vascular calcifications are present. Skull: Calvarium appears intact. Sinuses/Orbits: Paranasal sinuses and mastoid air cells are clear. Other: None. IMPRESSION: No acute intracranial abnormalities. Chronic atrophy and small vessel ischemic changes. Electronically Signed   By: Lucienne Capers M.D.   On: 08/22/2017 01:48   Dg Chest Portable 1 View  Result Date: 08/22/2017 CLINICAL DATA:  Hypoglycemia EXAM: PORTABLE  CHEST 1 VIEW COMPARISON:  07/04/2017 FINDINGS: Cardiac shadow is within normal limits. Postsurgical changes are noted. Left chest wall port is noted in satisfactory position. Aortic calcifications are noted. Multiple calcified hilar lymph nodes are seen. The lungs are clear bilaterally. Old rib fractures are noted on the right. IMPRESSION: Chronic changes without acute abnormality. Electronically Signed   By: Inez Catalina M.D.   On: 08/22/2017 01:06     Phillips Climes M.D on 08/23/2017 at 8:39 AM  Between 7am to 7pm - Pager - [phone]  After 7pm go to [email or web address] - password [facility]  [facility] -  Office  [phone]

## 2017-08-23 NOTE — Progress Notes (Signed)
Pharmacy Antibiotic Note  Kristin Peterson is a 83 y.o. female admitted on 08/22/2017 with sepsis of unknown source.  Pharmacy has been consulted for vancomycin and cefepime dosing.  Patient was found down at nursing facility with hypoglycemia and hypothermia.   Renal function improving, afebrile, WBC 12.   Plan: Increase vanc to 750mg  IV Q24H Continue cefepime 1gm IV Q24H Flagyl 500mg  IV Q8H per MD Monitor renal fxn, clinical progress, vanc trough as indicated  Height: 5\' 5"  (165.1 cm) Weight: 120 lb (54.4 kg) IBW/kg (Calculated) : 57  Temp (24hrs), Avg:99.7 F (37.6 C), Min:98.3 F (36.8 C), Max:100.4 F (38 C)  Recent Labs  Lab 08/22/17 0019 08/22/17 0031 08/22/17 0034 08/22/17 0142 08/22/17 0333 08/23/17 0516  WBC  --  11.2*  --   --   --  12.0*  CREATININE 1.36*  --  1.30*  --   --  1.10*  LATICACIDVEN  --   --   --  1.44 1.3  --     Estimated Creatinine Clearance: 35 mL/min (A) (by C-G formula based on SCr of 1.1 mg/dL (H)).    Allergies  Allergen Reactions  . Sulfa Antibiotics Other (See Comments)    unknown  . Contrast Media [Iodinated Diagnostic Agents] Itching and Rash    Vanc 8/15 >> Cefepime 8/15 >> Flagyl 8/15 >>   8/15 BCx - 8/15 GI PCR - negative 8/15 MRSA PCR - negative   Beautiful Pensyl D. Mina [place], PharmD, BCPS, [place] 08/23/2017, 7:40 AM

## 2017-08-23 NOTE — Plan of Care (Signed)
Pt states she doesn't "have much of an appetite". Pt having increased episodes of attempting to get out of bed unassisted. Pt requires frequent redirection. Pt is pleasant and cooperative.

## 2017-08-23 NOTE — Care Management Note (Signed)
Case Management Note  Patient Details  Name: Kristin Peterson MRN: [id number] Date of Birth: 04-02-1934  Subjective/Objective:       Admitted with hypoglycemia, Hx of dementia, coronary artery disease, paroxysmal atrial fibrillation on Eliquis, COPD, hypothyroidism, chronic kidney disease stage III, chronic diastolic CHF, and lung cancer.        Thomes Dinning ([address] Berniece Salines ([phone]     PCP: [facility]  Action/Plan: Transition to ALF when medically stable. PT evaluation pending.Marland KitchenMarland KitchenNCM to follow up with disposition needs. CSW aware pt from ALF.  Expected Discharge Date:                  Expected Discharge Plan:  Assisted Living / Rest Home  In-House Referral:  Clinical Social Work  Discharge planning Services  CM Consult  Post Acute Care Choice:    Choice offered to:     DME Arranged:    DME Agency:     HH Arranged:    HH Agency:     Status of Service:  In process, will continue to follow  If discussed at Long Length of Stay Meetings, dates discussed:    Additional Comments:  Sharin Mons, RN 08/23/2017, 1:00 PM

## 2017-08-24 LAB — CBC
HCT: 29.2 % — ABNORMAL LOW (ref 36.0–46.0)
HEMOGLOBIN: 9.4 g/dL — AB (ref 12.0–15.0)
MCH: 32 pg (ref 26.0–34.0)
MCHC: 32.2 g/dL (ref 30.0–36.0)
MCV: 99.3 fL (ref 78.0–100.0)
PLATELETS: 267 10*3/uL (ref 150–400)
RBC: 2.94 MIL/uL — AB (ref 3.87–5.11)
RDW: 14.2 % (ref 11.5–15.5)
WBC: 9.7 10*3/uL (ref 4.0–10.5)

## 2017-08-24 LAB — BASIC METABOLIC PANEL
Anion gap: 12 (ref 5–15)
BUN: 9 mg/dL (ref 8–23)
CHLORIDE: 104 mmol/L (ref 98–111)
CO2: 22 mmol/L (ref 22–32)
CREATININE: 1.1 mg/dL — AB (ref 0.44–1.00)
Calcium: 8.9 mg/dL (ref 8.9–10.3)
GFR, EST AFRICAN AMERICAN: 53 mL/min — AB (ref >60)
GFR, EST NON AFRICAN AMERICAN: 46 mL/min — AB (ref >60)
Glucose, Bld: 144 mg/dL — ABNORMAL HIGH (ref 70–99)
POTASSIUM: 3.7 mmol/L (ref 3.5–5.1)
SODIUM: 138 mmol/L (ref 135–145)

## 2017-08-24 LAB — HEMOGLOBIN A1C
Hgb A1c MFr Bld: 5.3 % (ref 4.8–5.6)
Mean Plasma Glucose: 105.41 mg/dL

## 2017-08-24 LAB — GLUCOSE, CAPILLARY
GLUCOSE-CAPILLARY: 120 mg/dL — AB (ref 70–99)
GLUCOSE-CAPILLARY: 122 mg/dL — AB (ref 70–99)
GLUCOSE-CAPILLARY: 133 mg/dL — AB (ref 70–99)
GLUCOSE-CAPILLARY: 136 mg/dL — AB (ref 70–99)
GLUCOSE-CAPILLARY: 144 mg/dL — AB (ref 70–99)
GLUCOSE-CAPILLARY: 149 mg/dL — AB (ref 70–99)
Glucose-Capillary: 141 mg/dL — ABNORMAL HIGH (ref 70–99)
Glucose-Capillary: 140 mg/dL — ABNORMAL HIGH (ref 70–99)

## 2017-08-24 MED ORDER — DEXTROSE 10 % IV SOLN
INTRAVENOUS | Status: DC
  Administered 2017-08-24 (×2): via INTRAVENOUS

## 2017-08-24 MED ORDER — ENSURE ENLIVE PO LIQD
237.0000 mL | Freq: Two times a day (BID) | ORAL | Status: DC
  Administered 2017-08-24: 237 mL via ORAL

## 2017-08-24 MED ORDER — SODIUM CHLORIDE 0.9 % IV SOLN: 2.0000 g | INTRAVENOUS | Status: DC

## 2017-08-24 MED ORDER — AMOXICILLIN-POT CLAVULANATE 875-125 MG PO TABS
1.0000 | ORAL_TABLET | Freq: Two times a day (BID) | ORAL | Status: DC
  Administered 2017-08-24 (×2): 1 via ORAL
  Filled 2017-08-24 (×3): qty 1

## 2017-08-24 MED ORDER — MIRTAZAPINE 15 MG PO TBDP
15.0000 mg | ORAL_TABLET | Freq: Every day | ORAL | Status: DC
  Administered 2017-08-24 – 2017-08-26 (×2): 15 mg via ORAL
  Filled 2017-08-24 (×3): qty 1

## 2017-08-24 MED ORDER — FUROSEMIDE 10 MG/ML IJ SOLN
40.0000 mg | Freq: Once | INTRAMUSCULAR | Status: AC
  Administered 2017-08-24: 40 mg via INTRAVENOUS
  Filled 2017-08-24: qty 4

## 2017-08-24 MED ORDER — FUROSEMIDE 40 MG PO TABS
40.0000 mg | ORAL_TABLET | Freq: Every day | ORAL | Status: DC
  Administered 2017-08-25 – 2017-08-26 (×2): 40 mg via ORAL
  Filled 2017-08-24 (×3): qty 1

## 2017-08-24 MED ORDER — METHYLPREDNISOLONE SODIUM SUCC 40 MG IJ SOLR
40.0000 mg | Freq: Three times a day (TID) | INTRAMUSCULAR | Status: DC
  Administered 2017-08-24 – 2017-08-25 (×4): 40 mg via INTRAVENOUS
  Filled 2017-08-24 (×4): qty 1

## 2017-08-24 NOTE — Plan of Care (Signed)
POC: No acute events throughout shift. VSS, afebrile. Sitter remains at the bedside. PRN medications considered, but not needed throughout shift. Fall prevention protocol maintained, no fall episodes noted. Skin integrity maintained, no new skin issues noted. POC reviewed with patient. RN to continue to monitor.

## 2017-08-24 NOTE — NC FL2 (Addendum)
[place] LEVEL OF CARE SCREENING TOOL     IDENTIFICATION  Patient Name: Kristin Peterson Birthdate: Oct 24, 1934 Sex: female Admission Date (Current Location): 08/22/2017  [facility] and [place] Number:  Herbalist and Address:  The . [facility], [place] [address], [place], [place] [address]      Provider Number: [id number]  Attending Physician Name and Address:  Albertine Patricia, MD  Relative Name and Phone Number:  Thomes Dinning, son, [phone]    Current Level of Care: Hospital Recommended Level of Care: [place] Prior Approval Number:    Date Approved/Denied:   PASRR Number: [id number]  Discharge Plan: SNF    Current Diagnoses: Patient Active Problem List   Diagnosis Date Noted  . COPD with acute exacerbation ([place])   . Hypothermia 08/22/2017  . Hypoglycemia without diagnosis of diabetes mellitus 08/22/2017  . SIRS (systemic inflammatory response syndrome) ([place]) 08/22/2017  . Hypoglycemia 08/22/2017  . Acute encephalopathy 08/22/2017  . Somnolence   . DNR (do not resuscitate)   . History of pulmonary embolus (PE) 05/27/2017  . Goals of care, counseling/discussion   . Palliative care by specialist   . Acute respiratory failure with hypoxia ([place]) 05/14/2017  . CAD (coronary artery disease) 05/14/2017  . PAF (paroxysmal atrial fibrillation) ([place]) 05/14/2017  . CKD (chronic kidney disease) stage 3, GFR 30-59 ml/min (HCC) 05/14/2017  . Chronic obstructive pulmonary disease ([place]) 09/27/2016  . Stage IV squamous cell carcinoma of left lung ([place]) 09/25/2016    Orientation RESPIRATION BLADDER Height & Weight     Self  Normal Incontinent, Indwelling catheter Weight: 120 lb (54.4 kg) Height:  5\' 5"  (165.1 cm)  BEHAVIORAL SYMPTOMS/MOOD NEUROLOGICAL BOWEL NUTRITION STATUS      Continent Diet(see discharge summary)  AMBULATORY STATUS COMMUNICATION OF NEEDS Skin   Extensive Assist Verbally Normal                        Personal Care Assistance Level of Assistance  Bathing, Feeding, Dressing Bathing Assistance: Maximum assistance Feeding assistance: Limited assistance Dressing Assistance: Maximum assistance     Functional Limitations Info             SPECIAL CARE FACTORS FREQUENCY  PT (By licensed PT), OT (By licensed OT)     PT Frequency: 5x week OT Frequency: 5x week            Contractures Contractures Info: Not present    Additional Factors Info  Code Status, Allergies, Psychotropic Code Status Info: DNR Allergies Info: SULFA ANTIBIOTICS, CONTRAST MEDIA IODINATED DIAGNOSTIC AGENTS  Psychotropic Info: mirtazapine (REMERON SOL-TAB) disintegrating tablet 15 mg PO daily at bedtime         Current Medications (08/24/2017):  This is the current hospital active medication list Current Facility-Administered Medications  Medication Dose Route Frequency Provider Last Rate Last Dose  . 0.9 %  sodium chloride infusion   Intravenous Continuous Opyd, Ilene Qua, MD   Stopped at 08/23/17 1800  . acetaminophen (TYLENOL) tablet 650 mg  650 mg Oral Q6H PRN Opyd, Ilene Qua, MD   650 mg at 08/23/17 1907   Or  . acetaminophen (TYLENOL) suppository 650 mg  650 mg Rectal Q6H PRN Opyd, Ilene Qua, MD      . albuterol (PROVENTIL) (2.5 MG/3ML) 0.083% nebulizer solution 2.5 mg  2.5 mg Nebulization Q6H PRN Opyd, Ilene Qua, MD      . apixaban (ELIQUIS) tablet 2.5 mg  2.5 mg  Oral BID Elgergawy, Silver Huguenin, MD   2.5 mg at 08/24/17 1004  . atorvastatin (LIPITOR) tablet 40 mg  40 mg Oral Daily Elgergawy, Silver Huguenin, MD   40 mg at 08/24/17 1004  . ceFEPIme (MAXIPIME) 1 g in sodium chloride 0.9 % 100 mL IVPB  1 g Intravenous Q24H Erenest Blank, RPH 200 mL/hr at 08/23/17 2239 1 g at 08/23/17 2239  . dextrose 10 % infusion   Intravenous Continuous Elgergawy, Silver Huguenin, MD 30 mL/hr at 08/23/17 2100    . dextrose 50 % solution 50 mL  1 ampule Intravenous PRN Gardiner Barefoot, NP   50 mL at 08/22/17 0951  .  ipratropium-albuterol (DUONEB) 0.5-2.5 (3) MG/3ML nebulizer solution 3 mL  3 mL Nebulization Q6H Elgergawy, Silver Huguenin, MD   3 mL at 08/24/17 0835  . levothyroxine (SYNTHROID, LEVOTHROID) tablet 112 mcg  112 mcg Oral QAC breakfast Elgergawy, Silver Huguenin, MD   112 mcg at 08/24/17 1010  . lip balm (CARMEX) ointment   Topical PRN Elgergawy, Silver Huguenin, MD      . LORazepam (ATIVAN) injection 0.5 mg  0.5 mg Intravenous Q4H PRN Elgergawy, Silver Huguenin, MD   0.5 mg at 08/23/17 1644  . methylPREDNISolone sodium succinate (SOLU-MEDROL) 125 mg/2 mL injection 80 mg  80 mg Intravenous Q8H Elgergawy, Silver Huguenin, MD   80 mg at 08/24/17 0508  . metoprolol tartrate (LOPRESSOR) tablet 25 mg  25 mg Oral Daily Elgergawy, Silver Huguenin, MD   25 mg at 08/24/17 1015  . metroNIDAZOLE (FLAGYL) IVPB 500 mg  500 mg Intravenous Q8H Opyd, Ilene Qua, MD 100 mL/hr at 08/24/17 1006 500 mg at 08/24/17 1006  . mirtazapine (REMERON SOL-TAB) disintegrating tablet 15 mg  15 mg Oral QHS Elgergawy, Silver Huguenin, MD      . mometasone-formoterol (DULERA) 200-5 MCG/ACT inhaler 2 puff  2 puff Inhalation BID Opyd, Ilene Qua, MD   2 puff at 08/24/17 0836  . ondansetron (ZOFRAN) tablet 4 mg  4 mg Oral Q6H PRN Opyd, Ilene Qua, MD       Or  . ondansetron (ZOFRAN) injection 4 mg  4 mg Intravenous Q6H PRN Opyd, Ilene Qua, MD      . pantoprazole (PROTONIX) EC tablet 40 mg  40 mg Oral Daily Elgergawy, Silver Huguenin, MD   40 mg at 08/24/17 1004  . sodium chloride flush (NS) 0.9 % injection 3 mL  3 mL Intravenous Q12H Opyd, Ilene Qua, MD   3 mL at 08/24/17 1012  . tiotropium (SPIRIVA) inhalation capsule 18 mcg  18 mcg Inhalation Daily Opyd, Ilene Qua, MD   18 mcg at 08/24/17 6073     Discharge Medications: Please see discharge summary for a list of discharge medications.  Relevant Imaging Results:  Relevant Lab Results:   Additional Information [id number] [place], [place]

## 2017-08-24 NOTE — Progress Notes (Signed)
Pharmacy Antibiotic Note  Kristin Peterson is a 83 y.o. female admitted on 08/22/2017 with sepsis of unknown source.  Pharmacy has been consulted for Cefepime dosing.  SCr 1.1, CrCl~30-40 ml/min. Will increase Cefepime dose slightly today.  Plan: - Adjust Cefepime to 2g IV every 24 hours - Continue Flagyl 500mg  IV Q8H per MD - Will continue to follow renal function, culture results, LOT, and antibiotic de-escalation plans   Height: 5\' 5"  (165.1 cm) Weight: 120 lb (54.4 kg) IBW/kg (Calculated) : 57  Temp (24hrs), Avg:99.7 F (37.6 C), Min:99.5 F (37.5 C), Max:99.9 F (37.7 C)  Recent Labs  Lab 08/22/17 0019 08/22/17 0031 08/22/17 0034 08/22/17 0142 08/22/17 0333 08/23/17 0516 08/24/17 0400  WBC  --  11.2*  --   --   --  12.0* 9.7  CREATININE 1.36*  --  1.30*  --   --  1.10* 1.10*  LATICACIDVEN  --   --   --  1.44 1.3  --   --     Estimated Creatinine Clearance: 35 mL/min (A) (by C-G formula based on SCr of 1.1 mg/dL (H)).    Allergies  Allergen Reactions  . Sulfa Antibiotics Other (See Comments)    unknown  . Contrast Media [Iodinated Diagnostic Agents] Itching and Rash    Vanc 8/15 >> 8/16 Cefepime 8/15 >> Flagyl 8/15 >>   8/15 BCx >> ngtd 8/15 GI PCR - negative 8/15 MRSA PCR - negative  Thank you for allowing pharmacy to be a part of this patient's care.  Alycia Rossetti, PharmD, BCPS Clinical Pharmacist Pager: [phone] Clinical phone for 08/24/2017 from 7a-3:30p: [phone] If after 3:30p, please call main pharmacy at: [phone] Please check AMION for all [place] numbers 08/24/2017 1:04 PM

## 2017-08-24 NOTE — Progress Notes (Signed)
PROGRESS NOTE                                                                                                                                                                                                             Patient Demographics:    Kristin Peterson, is a 83 y.o. female, DOB - 1934/04/10, RSW:[id number]  Admit date - 08/22/2017   Admitting Physician Vianne Bulls, MD  Outpatient Primary MD for the patient is [facility]  LOS - 2   Chief Complaint  Patient presents with  . Hypoglycemia       Brief Narrative    83 y.o. female with medical history significant for dementia, coronary artery disease, paroxysmal atrial fibrillation on Eliquis, COPD, hypothyroidism, chronic kidney disease stage III, chronic diastolic CHF, and lung cancer, now presenting from her nursing facility after she was found down on the floor unresponsive with CBG 30, and hypothermia.  Admitted to stepdown unit, started on dextrose drip, kept in the morning blanket, hypothermia improved within the first day, she had recurrent hypoglycemia and despite dextrose drip required D50 pushes.    Subjective:    [name] today rotation significantly improved yesterday, she had some confusion yesterday where she required a sitter, no significant events overnight, she is more appropriate and awake today.   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)   Chronic obstructive pulmonary disease (HCC)   CAD (coronary artery disease)   PAF (paroxysmal atrial fibrillation) (HCC)   CKD (chronic kidney disease) stage 3, GFR 30-59 ml/min (HCC)   History of pulmonary embolus (PE)   Hypothermia   SIRS (systemic inflammatory response syndrome) (HCC)   Acute encephalopathy   Somnolence   DNR (do not resuscitate)   COPD with acute exacerbation  ([place])   Hypothermia/hypoglycemia  -So far no clear etiology could be identified, infectious etiology is a possibility, but urine analysis and chest x-ray not suggestive of infection, abd exam is benign, no meningismus, no infected wounds identified , he is empirically on vancomycin, cefepime and Flagyl, given negative blood culture and MRSA PCR screening negative, cultures remain negative, vancomycin stopped 08/23/2017, ID will stop cefepime and Flagyl today, and start on p.o. Augmentin . -  Her cortisol level is 13, within normal range, but may be low for her current stress, she was started empirically on IV hydrocortisone, has been switched to IV Solu-Medrol 08/23/2017 given COPD exacerbation. -Please see discussion below regarding hypothyroidism as it may be contributing. -Continue to hold Plaquenil it may be causing hypoglycemia. -Initially requiring bear hugger, hypothermia resolved -Initially she needed D50 pushes on top of D10W drip at 100 cc/h, this has been improving, CBG currently acceptable on 30 cc D10W, I will DC fluids for now and monitor . -Follow on insulin, pro-insulin,,  sulfonylurea panel which are pending, her C-peptide came back elevated as 24,  discussed with radiology what is appropriate imaging to evaluate for insulinoma.  Apparently she will need NM PET gallium Detotate, have discussed with her medicine, able to evaluate if it is possible be done till Monday.  SIRS  - There is hypothermia and leukocytosis on admission  - UA and CXR reviewed and not suggestive of infection, - Blood cultures were collected in ED and she was started on empiric broad spectrum antibiotics , please see above discussion regarding antibiotics  Acute encephalopathy  - Head CT is negative for acute findings  - Was initially lethargic, but this has improved after normalizing of her temperature, she is confused this morning, this is most likely in the setting of hospital delirium, she did improve  today.  COPD exacerbation -She was with significant wheezing yesterday, improved with IV Solu-Medrol, she wheezing has improved, I will decrease her cell Medrol to 40 mg every 8 hours . - Continue ICS/LABA, Spiriva, and prn albuterol    Hypothyroidism -TSH elevated at 8.2, free T4 is low at 0.5, she is on Synthroid 88 mcg daily, increased hair Synthroid to 112 mcg  dailyStage IV adenocarcinoma of lung  - Diagnosed in 2015 and treated with chemotherapy until May 2016 when it was stopped d/t intolerance   - She continues to follow with oncology for observation - No evidence for recurrence on recent CT, planned for repeat scan in 6 months    Paroxysmal atrial fibrillation  - CHADS-VASc at least 5 (age x2, gender, CHF, CAD)  - Continue with Eliquis, dose adjusted,  CAD - Hx of CABG, chronic LBBB - Troponin is normal in ED  - Resume statin once she is more appropriate for oral intake    CKD stage III  -Baseline - Renally-dose medications   Chronic diastolic CHF  - Appears compensated , she is on aggressive IV fluids for hypoglycemia, I will give 1 dose of Lasix today, dose as needed.    Code Status : DNR  Family Communication  : none at bedside  Disposition Plan  : pending further work-up  Consults  :  none  Procedures  : none  DVT Prophylaxis  :  On Eliquis  Lab Results  Component Value Date   PLT 267 08/24/2017    Antibiotics  :    Anti-infectives (From admission, onward)   Start     Dose/Rate Route Frequency Ordered Stop   08/24/17 2200  ceFEPIme (MAXIPIME) 2 g in sodium chloride 0.9 % 100 mL IVPB  Status:  Discontinued     2 g 200 mL/hr over 30 Minutes Intravenous Every 24 hours 08/24/17 1305 08/24/17 1359   08/24/17 1400  amoxicillin-clavulanate (AUGMENTIN) 875-125 MG per tablet 1 tablet     1 tablet Oral Every 12 hours 08/24/17 1359     08/23/17 2300  vancomycin (VANCOCIN) IVPB 750 mg/150 ml premix  Status:  Discontinued     750 mg 150 mL/hr over 60  Minutes Intravenous Every 24 hours 08/23/17 0741 08/23/17 0836   08/22/17 2200  vancomycin (VANCOCIN) 500 mg in sodium chloride 0.9 % 100 mL IVPB  Status:  Discontinued     500 mg 100 mL/hr over 60 Minutes Intravenous Every 24 hours 08/22/17 0401 08/23/17 0741   08/22/17 2200  ceFEPIme (MAXIPIME) 1 g in sodium chloride 0.9 % 100 mL IVPB  Status:  Discontinued     1 g 200 mL/hr over 30 Minutes Intravenous Every 24 hours 08/22/17 0401 08/24/17 1305   08/22/17 0115  ceFEPIme (MAXIPIME) 2 g in sodium chloride 0.9 % 100 mL IVPB     2 g 200 mL/hr over 30 Minutes Intravenous  Once 08/22/17 0105 08/22/17 0234   08/22/17 0115  metroNIDAZOLE (FLAGYL) IVPB 500 mg  Status:  Discontinued     500 mg 100 mL/hr over 60 Minutes Intravenous Every 8 hours 08/22/17 0105 08/24/17 1359   08/22/17 0115  vancomycin (VANCOCIN) IVPB 1000 mg/200 mL premix     1,000 mg 200 mL/hr over 60 Minutes Intravenous  Once 08/22/17 0105 08/22/17 0416        Objective:   Vitals:   08/24/17 0039 08/24/17 0315 08/24/17 0720 08/24/17 0837  BP: (!) 148/81 137/79 137/73   Pulse: 90 99 93 95  Resp: (!) 24 15 20  (!) 21  Temp: 99.7 F (37.6 C) 99.5 F (37.5 C) 99.9 F (37.7 C)   TempSrc: Bladder Bladder Bladder   SpO2: 94% 94% 93% 94%  Weight:      Height:        Wt Readings from Last 3 Encounters:  08/22/17 54.4 kg  07/09/17 49.6 kg  06/04/17 48.6 kg     Intake/Output Summary (Last 24 hours) at 08/24/2017 1359 Last data filed at 08/24/2017 1032 Gross per 24 hour  Intake 1569.75 ml  Output 1700 ml  Net -130.25 ml     Physical Exam  Awake, sitting in recliner, more appropriate and coherent today, she is alert x2, but still confused Symmetrical Chest wall movement, Good air movement bilaterally, CTAB RRR,No Gallops,Rubs or new Murmurs, No Parasternal Heave +ve B.Sounds, Abd Soft, No tenderness, No rebound - guarding or rigidity. No Cyanosis, Clubbing or edema, No new Rash or bruise       Data Review:     CBC Recent Labs  Lab 08/22/17 0031 08/22/17 0034 08/23/17 0516 08/24/17 0400  WBC 11.2*  --  12.0* 9.7  HGB 10.3* 10.5* 9.9* 9.4*  HCT 34.0* 31.0* 31.7* 29.2*  PLT 283  --  276 267  MCV 104.6*  --  101.3* 99.3  MCH 31.7  --  31.6 32.0  MCHC 30.3  --  31.2 32.2  RDW 14.1  --  14.2 14.2  LYMPHSABS 0.4*  --   --   --   MONOABS 0.6  --   --   --   EOSABS 0.1  --   --   --   BASOSABS 0.1  --   --   --     Chemistries  Recent Labs  Lab 08/22/17 0019 08/22/17 0034 08/23/17 0516 08/24/17 0400  NA 142 142 142 138  K 3.8 3.6 3.5 3.7  CL 105 102 110 104  CO2 29  --  22 22  GLUCOSE 149* 143* 116* 144*  BUN 25* 28* 10 9  CREATININE 1.36* 1.30* 1.10* 1.10*  CALCIUM 8.8*  --  8.9  8.9  AST 33  --   --   --   ALT 24  --   --   --   ALKPHOS 62  --   --   --   BILITOT 0.4  --   --   --    ------------------------------------------------------------------------------------------------------------------ No results for input(s): CHOL, HDL, LDLCALC, TRIG, CHOLHDL, LDLDIRECT in the last 72 hours.  No results found for: HGBA1C ------------------------------------------------------------------------------------------------------------------ Recent Labs    08/22/17 0333  TSH 8.269*   ------------------------------------------------------------------------------------------------------------------ No results for input(s): VITAMINB12, FOLATE, FERRITIN, TIBC, IRON, RETICCTPCT in the last 72 hours.  Coagulation profile Recent Labs  Lab 08/22/17 0333  INR 1.19    No results for input(s): DDIMER in the last 72 hours.  Cardiac Enzymes Recent Labs  Lab 08/22/17 0333 08/22/17 0538 08/22/17 1454  TROPONINI <0.03 <0.03 <0.03   ------------------------------------------------------------------------------------------------------------------    Component Value Date/Time   BNP 196.1 (H) 05/13/2017 1813    Inpatient Medications  Scheduled Meds: . amoxicillin-clavulanate  1  tablet Oral Q12H  . apixaban  2.5 mg Oral BID  . atorvastatin  40 mg Oral Daily  . feeding supplement (ENSURE ENLIVE)  237 mL Oral BID BM  . furosemide  40 mg Intravenous Once  . [START ON 08/25/2017] furosemide  40 mg Oral Daily  . ipratropium-albuterol  3 mL Nebulization Q6H  . levothyroxine  112 mcg Oral QAC breakfast  . methylPREDNISolone (SOLU-MEDROL) injection  40 mg Intravenous Q8H  . metoprolol tartrate  25 mg Oral Daily  . mirtazapine  15 mg Oral QHS  . mometasone-formoterol  2 puff Inhalation BID  . pantoprazole  40 mg Oral Daily  . sodium chloride flush  3 mL Intravenous Q12H  . tiotropium  18 mcg Inhalation Daily   Continuous Infusions: . sodium chloride Stopped (08/23/17 1800)   PRN Meds:.acetaminophen **OR** acetaminophen, albuterol, dextrose, lip balm, LORazepam, ondansetron **OR** ondansetron (ZOFRAN) IV  Micro Results Recent Results (from the past 240 hour(s))  Blood Culture (routine x 2)     Status: None (Preliminary result)   Collection Time: 08/22/17  1:29 AM  Result Value Ref Range Status   Specimen Description BLOOD RIGHT WRIST  Final   Special Requests   Final    BOTTLES DRAWN AEROBIC AND ANAEROBIC Blood Culture adequate volume   Culture   Final    NO GROWTH 1 DAY Performed at [facility], [place] [address] [place], [place] [address]    Report Status PENDING  Incomplete  Blood Culture (routine x 2)     Status: None (Preliminary result)   Collection Time: 08/22/17  1:55 AM  Result Value Ref Range Status   Specimen Description BLOOD RIGHT FOREARM  Final   Special Requests   Final    BOTTLES DRAWN AEROBIC AND ANAEROBIC Blood Culture results may not be optimal due to an inadequate volume of blood received in culture bottles   Culture   Final    NO GROWTH 1 DAY Performed at [facility], [place] [address] [place], [place] [address]    Report Status PENDING  Incomplete  MRSA PCR Screening     Status: None   Collection Time: 08/22/17  6:24 AM   Result Value Ref Range Status   MRSA by PCR NEGATIVE NEGATIVE Final    Comment:        The GeneXpert MRSA Assay (FDA approved for NASAL specimens only), is one component of a comprehensive MRSA colonization surveillance program. It is not intended  to diagnose MRSA infection nor to guide or monitor treatment for MRSA infections. Performed at [facility], [place] [address] [place], [place] [address]   Gastrointestinal Panel by PCR , Stool     Status: None   Collection Time: 08/22/17  6:24 AM  Result Value Ref Range Status   Campylobacter species NOT DETECTED NOT DETECTED Final   Plesimonas shigelloides NOT DETECTED NOT DETECTED Final   Salmonella species NOT DETECTED NOT DETECTED Final   Yersinia enterocolitica NOT DETECTED NOT DETECTED Final   Vibrio species NOT DETECTED NOT DETECTED Final   Vibrio cholerae NOT DETECTED NOT DETECTED Final   Enteroaggregative E coli (EAEC) NOT DETECTED NOT DETECTED Final   Enteropathogenic E coli (EPEC) NOT DETECTED NOT DETECTED Final   Enterotoxigenic E coli (ETEC) NOT DETECTED NOT DETECTED Final   Shiga like toxin producing E coli (STEC) NOT DETECTED NOT DETECTED Final   Shigella/Enteroinvasive E coli (EIEC) NOT DETECTED NOT DETECTED Final   Cryptosporidium NOT DETECTED NOT DETECTED Final   Cyclospora cayetanensis NOT DETECTED NOT DETECTED Final   Entamoeba histolytica NOT DETECTED NOT DETECTED Final   Giardia lamblia NOT DETECTED NOT DETECTED Final   Adenovirus F40/41 NOT DETECTED NOT DETECTED Final   Astrovirus NOT DETECTED NOT DETECTED Final   Norovirus GI/GII NOT DETECTED NOT DETECTED Final   Rotavirus A NOT DETECTED NOT DETECTED Final   Sapovirus (I, II, IV, and V) NOT DETECTED NOT DETECTED Final    Comment: Performed at [facility], [address] [place], [place] [address]    Radiology Reports Ct Head Wo Contrast  Result Date: 08/22/2017 CLINICAL DATA:  Altered mental status. Found on the floor. Posttraumatic  headache. Hypoglycemia. EXAM: CT HEAD WITHOUT CONTRAST TECHNIQUE: Contiguous axial images were obtained from the base of the skull through the vertex without intravenous contrast. COMPARISON:  None. FINDINGS: Brain: Diffuse cerebral atrophy. Ventricular dilatation consistent with central atrophy. Low-attenuation changes throughout the deep white matter consistent with small vessel ischemia. Diffusely prominent CSF space likely due to atrophy. No mass effect or midline shift. No abnormal extra-axial fluid collections. Gray-white matter junctions are distinct. Basal cisterns are not effaced. No acute intracranial hemorrhage. Vascular: Intracranial arterial vascular calcifications are present. Skull: Calvarium appears intact. Sinuses/Orbits: Paranasal sinuses and mastoid air cells are clear. Other: None. IMPRESSION: No acute intracranial abnormalities. Chronic atrophy and small vessel ischemic changes. Electronically Signed   By: Lucienne Capers M.D.   On: 08/22/2017 01:48   Dg Chest Portable 1 View  Result Date: 08/22/2017 CLINICAL DATA:  Hypoglycemia EXAM: PORTABLE CHEST 1 VIEW COMPARISON:  07/04/2017 FINDINGS: Cardiac shadow is within normal limits. Postsurgical changes are noted. Left chest wall port is noted in satisfactory position. Aortic calcifications are noted. Multiple calcified hilar lymph nodes are seen. The lungs are clear bilaterally. Old rib fractures are noted on the right. IMPRESSION: Chronic changes without acute abnormality. Electronically Signed   By: Inez Catalina M.D.   On: 08/22/2017 01:06     Phillips Climes M.D on 08/24/2017 at 1:59 PM  Between 7am to 7pm - Pager - [phone]  After 7pm go to [email or web address] - password [email or web address]  [facility] -  Office  [phone]

## 2017-08-24 NOTE — Evaluation (Addendum)
Physical Therapy Evaluation Patient Details Name: Kristin Peterson MRN: [id number] DOB: 07-May-1934 Today's Date: 08/24/2017   History of Present Illness  83 y.o. female with medical history significant for dementia, coronary artery disease, paroxysmal atrial fibrillation on Eliquis, COPD, hypothyroidism, chronic kidney disease stage III, chronic diastolic CHF, and lung cancer, now presenting from her nursing facility after she was found down on the floor unresponsive with CBG 30.    Clinical Impression  Pt admitted with above diagnosis. Pt currently with functional limitations due to the deficits listed below (see PT Problem List). On eval, pt required mod assist bed mobility and max assist stand-pivot transfer bed to recliner. Pt with increased fear of falling, deficits in balance and confusion. Pt tachy during session with resting HR of 105 and max HR of 116 during mobility. BP stable. Pt will benefit from skilled PT to increase their independence and safety with mobility to allow discharge to the venue listed below.       Follow Up Recommendations SNF(can return to ALF, if staff able to provide needed level of assist)    Equipment Recommendations  None recommended by PT    Recommendations for Other Services       Precautions / Restrictions Precautions Precautions: Fall Precaution Comments: AMS (has sitter)      Mobility  Bed Mobility Overal bed mobility: Needs Assistance Bed Mobility: Supine to Sit     Supine to sit: Mod assist;HOB elevated     General bed mobility comments: assist to elevate trunk, use of bed pad to scoot to EOB, increased time and effort  Transfers Overall transfer level: Needs assistance Equipment used: None Transfers: Stand Pivot Transfers   Stand pivot transfers: Max assist       General transfer comment: increased assist to due anxiety/fear of falling   Ambulation/Gait             General Gait Details: unable  Stairs             Wheelchair Mobility    Modified Rankin (Stroke Patients Only)       Balance Overall balance assessment: Needs assistance Sitting-balance support: No upper extremity supported;Feet supported Sitting balance-Leahy Scale: Fair       Standing balance-Leahy Scale: Zero Standing balance comment: max assist to maintain balance during transfer                             Pertinent Vitals/Pain Pain Assessment: No/denies pain    Home Living Family/patient expects to be discharged to:: Assisted living               Home Equipment: Wheelchair - manual      Prior Function           Comments: Pt is an unreliable historian. Per chart, pt independent with transfers and used w/c for mobility.     Hand Dominance   Dominant Hand: Right    Extremity/Trunk Assessment   Upper Extremity Assessment Upper Extremity Assessment: Generalized weakness    Lower Extremity Assessment Lower Extremity Assessment: Generalized weakness    Cervical / Trunk Assessment Cervical / Trunk Assessment: Kyphotic  Communication   Communication: Expressive difficulties  Cognition Arousal/Alertness: Awake/alert Behavior During Therapy: Anxious(fearful of falling) Overall Cognitive Status: No family/caregiver present to determine baseline cognitive functioning Area of Impairment: Orientation;Attention;Memory;Following commands;Safety/judgement;Problem solving;Awareness                 Orientation Level: Disoriented to;Time Current  Attention Level: Sustained Memory: Decreased short-term memory Following Commands: Follows one step commands with increased time Safety/Judgement: Decreased awareness of safety Awareness: Emergent Problem Solving: Difficulty sequencing;Requires verbal cues;Slow processing;Decreased initiation General Comments: word finding difficulty      General Comments      Exercises     Assessment/Plan    PT Assessment Patient needs continued PT  services  PT Problem List Decreased strength;Decreased mobility;Decreased safety awareness;Decreased knowledge of precautions;Decreased activity tolerance;Decreased cognition;Decreased balance       PT Treatment Interventions Therapeutic activities;Cognitive remediation;Gait training;Therapeutic exercise;Patient/family education;Balance training;Functional mobility training    PT Goals (Current goals can be found in the Care Plan section)  Acute Rehab PT Goals Patient Stated Goal: not stated PT Goal Formulation: Patient unable to participate in goal setting Time For Goal Achievement: 09/07/17 Potential to Achieve Goals: Fair    Frequency Min 2X/week   Barriers to discharge        Co-evaluation               AM-PAC PT "6 Clicks" Daily Activity  Outcome Measure Difficulty turning over in bed (including adjusting bedclothes, sheets and blankets)?: A Lot Difficulty moving from lying on back to sitting on the side of the bed? : Unable Difficulty sitting down on and standing up from a chair with arms (e.g., wheelchair, bedside commode, etc,.)?: Unable Help needed moving to and from a bed to chair (including a wheelchair)?: A Lot Help needed walking in hospital room?: Total Help needed climbing 3-5 steps with a railing? : Total 6 Click Score: 8    End of Session Equipment Utilized During Treatment: Gait belt Activity Tolerance: Patient tolerated treatment well Patient left: in chair;with call bell/phone within reach;with nursing/sitter in room(sitter present, mitts left off for pt to eat breakfast) Nurse Communication: Mobility status PT Visit Diagnosis: Unsteadiness on feet (R26.81);Other abnormalities of gait and mobility (R26.89);History of falling (Z91.81);Muscle weakness (generalized) (M62.81)    Time: 9977-4142 PT Time Calculation (min) (ACUTE ONLY): 25 min   Charges:   PT Evaluation $PT Eval Moderate Complexity: 1 Mod PT Treatments $Therapeutic Activity: 8-22  mins        Lorrin Goodell, PT  Office # [phone] Pager [phone]   Lorriane Shire 08/24/2017, 9:49 AM

## 2017-08-24 NOTE — Plan of Care (Signed)
Pt resting in chair comfortably in no apparent distress

## 2017-08-25 LAB — GLUCOSE, CAPILLARY
GLUCOSE-CAPILLARY: 123 mg/dL — AB (ref 70–99)
Glucose-Capillary: 149 mg/dL — ABNORMAL HIGH (ref 70–99)
Glucose-Capillary: 118 mg/dL — ABNORMAL HIGH (ref 70–99)
Glucose-Capillary: 147 mg/dL — ABNORMAL HIGH (ref 70–99)
Glucose-Capillary: 118 mg/dL — ABNORMAL HIGH (ref 70–99)

## 2017-08-25 MED ORDER — IPRATROPIUM-ALBUTEROL 0.5-2.5 (3) MG/3ML IN SOLN: 3.0000 mL | Freq: Four times a day (QID) | RESPIRATORY_TRACT | Status: DC | PRN

## 2017-08-25 MED ORDER — METHYLPREDNISOLONE SODIUM SUCC 40 MG IJ SOLR
40.0000 mg | Freq: Two times a day (BID) | INTRAMUSCULAR | Status: DC
Start: 2017-08-26 — End: 2017-08-27
  Administered 2017-08-26 (×2): 40 mg via INTRAVENOUS
  Filled 2017-08-25 (×2): qty 1

## 2017-08-25 MED ORDER — ENSURE ENLIVE PO LIQD
237.0000 mL | Freq: Three times a day (TID) | ORAL | Status: DC
  Administered 2017-08-25 – 2017-08-26 (×5): 237 mL via ORAL

## 2017-08-25 NOTE — Progress Notes (Signed)
PROGRESS NOTE                                                                                                                                                                                                             Patient Demographics:    Kristin Peterson, is a 83 y.o. female, DOB - Aug 15, 1934, WJX:[id number]  Admit date - 08/22/2017   Admitting Physician Vianne Bulls, MD  Outpatient Primary MD for the patient is [facility]  LOS - 3   Chief Complaint  Patient presents with  . Hypoglycemia       Brief Narrative    83 y.o. female with medical history significant for dementia, coronary artery disease, paroxysmal atrial fibrillation on Eliquis, COPD, hypothyroidism, chronic kidney disease stage III, chronic diastolic CHF, and lung cancer, now presenting from her nursing facility after she was found down on the floor unresponsive with CBG 30, and hypothermia.  Admitted to stepdown unit, started on dextrose drip, to put on warming blanket, hypothermia resolved from day 1, she kept requiring D10W for 3 days .   Subjective:    Kristin Peterson today [place] any complaints, per staff she still have a poor appetite, but more appropriate less agitated and confused.   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  No recurrence -D10W  for hypoglycemia, has been stopped today, improved oral intake, encouraged to drink Ensure, -no Clear etiology, cortisol within normal limits,. -Continue to hold Plaquenil as may be causing hypoglycemia. -BX due to infectious process, kept in spectrum empiric antibiotic coverage initially, all antibiotics has been stopped today as negative infectious work-up. -Follow on insulin, pro-insulin,,  sulfonylurea panel which are pending, her C-peptide came back elevated as  24,  discussed with radiology what is appropriate imaging to evaluate for insulinoma.  Apparently she will need NM PET gallium Detotate, have discussed with her medicine, able to evaluate if it is possible be done till Monday.  SIRS  - Physiology of SIRS has resolved, no evidence of infection, currently off antibiotics.  Acute encephalopathy  - Head CT is negative for acute findings  - Was initially lethargic, but this has improved after normalizing of her temperature, she is confused this morning, this is most likely in the setting of hospital delirium, she did improve today.  COPD exacerbation Wheezing has improved, will change to p.o. prednisone tomorrow- Continue ICS/LABA, Spiriva, and prn albuterol    Hypothyroidism -TSH elevated at 8.2, free T4 is low at 0.5, she is on Synthroid 88 mcg daily, increased hair Synthroid to 112 mcg  dailyStage IV adenocarcinoma of lung  - Diagnosed in 2015 and treated with chemotherapy until May 2016 when it was stopped d/t intolerance   - She continues to follow with oncology for observation - No evidence for recurrence on recent CT, planned for repeat scan in 6 months    Paroxysmal atrial fibrillation  - CHADS-VASc at least 5 (age x2, gender, CHF, CAD)  - Continue with Eliquis, dose adjusted,  CAD - Hx of CABG, chronic LBBB - Troponin is normal in ED  - Resume statin once she is more appropriate for oral intake    CKD stage III  -Baseline - Renally-dose medications   Chronic  diastolic CHF  - Appears compensated , she is on aggressive IV fluids for hypoglycemia, I will give 1 dose of Lasix today, dose as needed.    Code Status : DNR  Family Communication  : Discussed with son  Disposition Plan  : Need SNF placement, in 1 to 2 days  Consults  :  none  Procedures  : none  DVT Prophylaxis  :  On Eliquis  Lab Results  Component Value Date   PLT 267 08/24/2017    Antibiotics  :    Anti-infectives (From admission, onward)   Start     Dose/Rate Route Frequency Ordered Stop   08/24/17 2200  ceFEPIme (MAXIPIME) 2 g in sodium chloride 0.9 % 100 mL IVPB  Status:  Discontinued     2 g 200 mL/hr over 30 Minutes Intravenous Every 24 hours 08/24/17 1305 08/24/17 1359   08/24/17 1400  amoxicillin-clavulanate (AUGMENTIN) 875-125 MG per tablet 1 tablet  Status:  Discontinued     1 tablet Oral Every 12 hours 08/24/17 1359 08/25/17 0715   08/23/17 2300  vancomycin (VANCOCIN) IVPB 750 mg/150 ml premix  Status:  Discontinued     750 mg 150 mL/hr over 60 Minutes Intravenous Every 24 hours 08/23/17 0741 08/23/17 0836   08/22/17 2200  vancomycin (VANCOCIN) 500 mg in sodium chloride 0.9 % 100 mL IVPB  Status:  Discontinued     500 mg 100 mL/hr over 60 Minutes Intravenous Every 24 hours 08/22/17 0401 08/23/17 0741   08/22/17 2200  ceFEPIme (MAXIPIME) 1 g in sodium chloride 0.9 % 100 mL IVPB  Status:  Discontinued     1 g 200 mL/hr over 30 Minutes Intravenous Every 24 hours 08/22/17 0401 08/24/17 1305   08/22/17 0115  ceFEPIme (MAXIPIME) 2 g in sodium chloride 0.9 % 100 mL IVPB     2 g 200 mL/hr over 30 Minutes Intravenous  Once 08/22/17 0105 08/22/17 0234   08/22/17 0115  metroNIDAZOLE (FLAGYL) IVPB 500 mg  Status:  Discontinued     500 mg 100 mL/hr over 60 Minutes Intravenous Every 8 hours 08/22/17 0105 08/24/17 1359   08/22/17 0115  vancomycin (VANCOCIN) IVPB 1000 mg/200 mL premix     1,000 mg 200 mL/hr over 60 Minutes Intravenous  Once 08/22/17 0105 08/22/17 0416          Objective:   Vitals:   08/25/17 0913 08/25/17 0919 08/25/17 1210 08/25/17 1355  BP:  (!) 152/81 114/67   Pulse:  84 71   Resp:  19 17   Temp:   98.3 F (36.8 C) 97.9 F (36.6 C)  TempSrc:   Oral Oral  SpO2: 93% 93% 93%   Weight:      Height:        Wt Readings from Last 3 Encounters:  08/22/17 54.4 kg  07/09/17 49.6 kg  06/04/17 48.6 kg     Intake/Output Summary (Last 24 hours) at 08/25/2017 1440 Last data filed at 08/25/2017 1427 Gross per 24 hour  Intake 636.93 ml  Output 502 ml  Net 134.93 ml     Physical Exam  Awake, pleasant, conversant, x2 Symmetrical Chest wall movement, Good air movement bilaterally, CTAB RRR,No Gallops,Rubs or new Murmurs, No Parasternal Heave +ve B.Sounds, Abd Soft, No tenderness, No rebound - guarding or rigidity. No Cyanosis, Clubbing or edema, No new Rash or bruise        Data Review:    CBC Recent Labs  Lab 08/22/17 0031 08/22/17 0034 08/23/17 0516 08/24/17 0400  WBC 11.2*  --  12.0* 9.7  HGB 10.3* 10.5* 9.9* 9.4*  HCT 34.0* 31.0* 31.7* 29.2*  PLT 283  --  276 267  MCV 104.6*  --  101.3* 99.3  MCH 31.7  --  31.6 32.0  MCHC 30.3  --  31.2 32.2  RDW 14.1  --  14.2 14.2  LYMPHSABS 0.4*  --   --   --   MONOABS 0.6  --   --   --   EOSABS 0.1  --   --   --   BASOSABS 0.1  --   --   --     Chemistries  Recent Labs  Lab 08/22/17 0019 08/22/17 0034 08/23/17 0516 08/24/17 0400  NA 142 142 142 138  K 3.8 3.6 3.5 3.7  CL 105 102 110 104  CO2 29  --  22 22  GLUCOSE 149* 143* 116* 144*  BUN 25* 28* 10 9  CREATININE 1.36* 1.30* 1.10* 1.10*  CALCIUM 8.8*  --  8.9 8.9  AST 33  --   --   --   ALT 24  --   --   --   ALKPHOS 62  --   --   --   BILITOT 0.4  --   --   --    ------------------------------------------------------------------------------------------------------------------ No results for input(s): CHOL, HDL, LDLCALC, TRIG, CHOLHDL, LDLDIRECT in the last 72 hours.  Lab Results  Component Value  Date   HGBA1C 5.3 08/24/2017   ------------------------------------------------------------------------------------------------------------------ No results for input(s): TSH, T4TOTAL, T3FREE, THYROIDAB in the last 72 hours.  Invalid input(s): FREET3 ------------------------------------------------------------------------------------------------------------------ No results for input(s): VITAMINB12, FOLATE, FERRITIN, TIBC, IRON, RETICCTPCT in the last 72 hours.  Coagulation profile Recent Labs  Lab 08/22/17 0333  INR 1.19    No results for input(s): DDIMER in the last 72 hours.  Cardiac Enzymes Recent Labs  Lab 08/22/17 0333 08/22/17 0538 08/22/17 1454  TROPONINI <0.03 <0.03 <0.03   ------------------------------------------------------------------------------------------------------------------  Component Value Date/Time   BNP 196.1 (H) 05/13/2017 1813    Inpatient Medications  Scheduled Meds: . apixaban  2.5 mg Oral BID  . atorvastatin  40 mg Oral Daily  . feeding supplement (ENSURE ENLIVE)  237 mL Oral TID BM  . furosemide  40 mg Oral Daily  . levothyroxine  112 mcg Oral QAC breakfast  . methylPREDNISolone (SOLU-MEDROL) injection  40 mg Intravenous Q8H  . metoprolol tartrate  25 mg Oral Daily  . mirtazapine  15 mg Oral QHS  . mometasone-formoterol  2 puff Inhalation BID  . pantoprazole  40 mg Oral Daily  . sodium chloride flush  3 mL Intravenous Q12H  . tiotropium  18 mcg Inhalation Daily   Continuous Infusions: . sodium chloride Stopped (08/23/17 1800)   PRN Meds:.acetaminophen **OR** acetaminophen, albuterol, dextrose, ipratropium-albuterol, lip balm, LORazepam, ondansetron **OR** ondansetron (ZOFRAN) IV  Micro Results Recent Results (from the past 240 hour(s))  Blood Culture (routine x 2)     Status: None (Preliminary result)   Collection Time: 08/22/17  1:29 AM  Result Value Ref Range Status   Specimen Description BLOOD RIGHT WRIST  Final   Special  Requests   Final    BOTTLES DRAWN AEROBIC AND ANAEROBIC Blood Culture adequate volume   Culture   Final    NO GROWTH 2 DAYS Performed at [facility], [address], [place], [place] [address]    Report Status PENDING  Incomplete  Blood Culture (routine x 2)     Status: None (Preliminary result)   Collection Time: 08/22/17  1:55 AM  Result Value Ref Range Status   Specimen Description BLOOD RIGHT FOREARM  Final   Special Requests   Final    BOTTLES DRAWN AEROBIC AND ANAEROBIC Blood Culture results may not be optimal due to an inadequate volume of blood received in culture bottles   Culture   Final    NO GROWTH 2 DAYS Performed at [facility], [place] [address] [place], [place] [address]    Report Status PENDING  Incomplete  MRSA PCR Screening     Status: None   Collection Time: 08/22/17  6:24 AM  Result Value Ref Range Status   MRSA by PCR NEGATIVE NEGATIVE Final    Comment:        The GeneXpert MRSA Assay (FDA approved for NASAL specimens only), is one component of a comprehensive MRSA colonization surveillance program. It is not intended to diagnose MRSA infection nor to guide or monitor treatment for MRSA infections. Performed at [facility], [place] [address], [place], [place] [address]   Gastrointestinal Panel by PCR , Stool     Status: None   Collection Time: 08/22/17  6:24 AM  Result Value Ref Range Status   Campylobacter species NOT DETECTED NOT DETECTED Final   Plesimonas shigelloides NOT DETECTED NOT DETECTED Final   Salmonella species NOT DETECTED NOT DETECTED Final   Yersinia enterocolitica NOT DETECTED NOT DETECTED Final   Vibrio species NOT DETECTED NOT DETECTED Final   Vibrio cholerae NOT DETECTED NOT DETECTED Final   Enteroaggregative E coli (EAEC) NOT DETECTED NOT DETECTED Final   Enteropathogenic E coli (EPEC) NOT DETECTED NOT DETECTED Final   Enterotoxigenic E coli (ETEC) NOT DETECTED NOT DETECTED Final   Shiga like toxin producing E  coli (STEC) NOT DETECTED NOT DETECTED Final   Shigella/Enteroinvasive E coli (EIEC) NOT DETECTED NOT DETECTED Final   Cryptosporidium NOT DETECTED NOT DETECTED Final   Cyclospora  cayetanensis NOT DETECTED NOT DETECTED Final   Entamoeba histolytica NOT DETECTED NOT DETECTED Final   Giardia lamblia NOT DETECTED NOT DETECTED Final   Adenovirus F40/41 NOT DETECTED NOT DETECTED Final   Astrovirus NOT DETECTED NOT DETECTED Final   Norovirus GI/GII NOT DETECTED NOT DETECTED Final   Rotavirus A NOT DETECTED NOT DETECTED Final   Sapovirus (I, II, IV, and V) NOT DETECTED NOT DETECTED Final    Comment: Performed at [facility], [address], [place], [place] [address]    Radiology Reports Ct Head Wo Contrast  Result Date: 08/22/2017 CLINICAL DATA:  Altered mental status. Found on the floor. Posttraumatic headache. Hypoglycemia. EXAM: CT HEAD WITHOUT CONTRAST TECHNIQUE: Contiguous axial images were obtained from the base of the skull through the vertex without intravenous contrast. COMPARISON:  None. FINDINGS: Brain: Diffuse cerebral atrophy. Ventricular dilatation consistent with central atrophy. Low-attenuation changes throughout the deep white matter consistent with small vessel ischemia. Diffusely prominent CSF space likely due to atrophy. No mass effect or midline shift. No abnormal extra-axial fluid collections. Gray-white matter junctions are distinct. Basal cisterns are not effaced. No acute intracranial hemorrhage. Vascular: Intracranial arterial vascular calcifications are present. Skull: Calvarium appears intact. Sinuses/Orbits: Paranasal sinuses and mastoid air cells are clear. Other: None. IMPRESSION: No acute intracranial abnormalities. Chronic atrophy and small vessel ischemic changes. Electronically Signed   By: Lucienne Capers M.D.   On: 08/22/2017 01:48   Dg Chest Portable 1 View  Result Date: 08/22/2017 CLINICAL DATA:  Hypoglycemia EXAM: PORTABLE CHEST 1 VIEW COMPARISON:   07/04/2017 FINDINGS: Cardiac shadow is within normal limits. Postsurgical changes are noted. Left chest wall port is noted in satisfactory position. Aortic calcifications are noted. Multiple calcified hilar lymph nodes are seen. The lungs are clear bilaterally. Old rib fractures are noted on the right. IMPRESSION: Chronic changes without acute abnormality. Electronically Signed   By: Inez Catalina M.D.   On: 08/22/2017 01:06     Phillips Climes M.D on 08/25/2017 at 2:40 PM  Between 7am to 7pm - Pager - [phone]  After 7pm go to [email or web address] - password [facility]  [facility] -  Office  [phone]

## 2017-08-25 NOTE — Clinical Social Work Note (Signed)
Clinical Social Work Assessment  Patient Details  Name: Kristin Peterson MRN: [id number] Date of Birth: September 09, 1934  Date of referral:  08/25/17               Reason for consult:  Facility Placement, Discharge Planning                Permission sought to share information with:  Facility Sport and exercise psychologist, Family Supports Permission granted to share information::     Name::     Thomes Dinning   Agency::  SNFs  Relationship::  son  Contact Information:  [phone]  Housing/Transportation Living arrangements for the past 2 months:  [place] of Information:  Adult Children Patient Interpreter Needed:  None Criminal Activity/Legal Involvement Pertinent to Current Situation/Hospitalization:  No - Comment as needed Significant Relationships:  Adult Children Lives with:  Facility Resident Do you feel safe going back to the place where you live?  Yes Need for family participation in patient care:  Yes (Comment)  Care giving concerns: Patient from [facility]. PT recommending SNF.   Social Worker assessment / plan: CSW spoke to patient's son, Shanon Brow, on the phone. CSW introduced self and role and discussed disposition planning - PT recommendation for SNF. Son reported that patient used to live at an ALF in [facility] and he moved her up here to [facility] about a year ago.  Shanon Brow reported that patient mostly uses a wheelchair at baseline, due to significant hip problems and pain. He stated that patient is generally able to transfer from her chair for toileting, etc. CSW reflected on PT evaluation that stated patient now needing assist for transfers. Son agreeable to SNF referrals.   CSW sent out initial referrals, will provide bed offers when available. CSW will follow and support with discharge planning.  Employment status:  Retired Forensic scientist:  [employer] PT Recommendations:  [place] / Referral to community resources:  [place]  Patient/Family's Response to care: Son appreciative of care.  Patient/Family's Understanding of and Emotional Response to Diagnosis, Current Treatment, and Prognosis: Son with understanding of patient's condition and states he wants what is best for her. Agreeable to SNF.  Emotional Assessment Appearance:  Appears stated age Attitude/Demeanor/Rapport:  Unable to Assess Affect (typically observed):  Unable to Assess Orientation:  Oriented to Self, Oriented to  Time, Oriented to Place, Oriented to Situation Alcohol / Substance use:  Not Applicable Psych involvement (Current and /or in the community):  No (Comment)  Discharge Needs  Concerns to be addressed:  Discharge Planning Concerns, Care Coordination Readmission within the last 30 days:  No Current discharge risk:  Physical Impairment, Cognitively Impaired Barriers to Discharge:  Continued Medical Work up   Estanislado Emms, LCSW 08/25/2017, 11:43 AM

## 2017-08-25 NOTE — Progress Notes (Signed)
Daily weight unable to be calculated due to low bed malfunctioning. Will report off to oncoming RN.

## 2017-08-26 ENCOUNTER — Other Ambulatory Visit: Payer: Self-pay

## 2017-08-26 DIAGNOSIS — I480 Paroxysmal atrial fibrillation: Secondary | ICD-10-CM

## 2017-08-26 DIAGNOSIS — R6510 Systemic inflammatory response syndrome (SIRS) of non-infectious origin without acute organ dysfunction: Secondary | ICD-10-CM

## 2017-08-26 LAB — GLUCOSE, CAPILLARY
GLUCOSE-CAPILLARY: 106 mg/dL — AB (ref 70–99)
Glucose-Capillary: 117 mg/dL — ABNORMAL HIGH (ref 70–99)
Glucose-Capillary: 112 mg/dL — ABNORMAL HIGH (ref 70–99)
Glucose-Capillary: 152 mg/dL — ABNORMAL HIGH (ref 70–99)
Glucose-Capillary: 105 mg/dL — ABNORMAL HIGH (ref 70–99)

## 2017-08-26 MED ORDER — PREDNISONE 5 MG (21) PO TBPK: ORAL_TABLET | ORAL | Status: AC

## 2017-08-26 MED ORDER — MIRTAZAPINE 15 MG PO TABS: 15.0000 mg | ORAL_TABLET | Freq: Every day | ORAL | Status: AC

## 2017-08-26 MED ORDER — ENSURE ENLIVE PO LIQD: 237.0000 mL | Freq: Three times a day (TID) | ORAL | 12 refills | Status: AC

## 2017-08-26 MED ORDER — APIXABAN 2.5 MG PO TABS: 2.5000 mg | ORAL_TABLET | Freq: Two times a day (BID) | ORAL | Status: AC

## 2017-08-26 MED ORDER — CLONAZEPAM 0.5 MG PO TABS: 0.5000 mg | ORAL_TABLET | Freq: Every day | ORAL | 0 refills | Status: AC | PRN

## 2017-08-26 MED ORDER — LEVOTHYROXINE SODIUM 112 MCG PO TABS: 112.0000 ug | ORAL_TABLET | Freq: Every day | ORAL | Status: AC

## 2017-08-26 MED ORDER — CLONAZEPAM 0.5 MG PO TABS: 0.7500 mg | ORAL_TABLET | Freq: Every day | ORAL | 0 refills | Status: AC

## 2017-08-26 MED ORDER — FUROSEMIDE 40 MG PO TABS: 20.0000 mg | ORAL_TABLET | ORAL | 0 refills | Status: AC

## 2017-08-26 NOTE — Discharge Summary (Signed)
Kristin Peterson, is a 83 y.o. female  DOB 1934/11/02  MRN [id number].  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) [C34.92] Diarrhea, unspecified type [R19.7]   Discharge Diagnosis  Somnolence [R40.0] SIRS (systemic inflammatory response syndrome) ([place]) [R65.10] 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) [C34.92] Diarrhea, unspecified type [R19.7]    Principal Problem:   Hypoglycemia without diagnosis of diabetes mellitus Active Problems:   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 Allergy   . Anxiety   . Arthritis   . CAD (coronary artery disease)   . Cataract   . COPD (chronic obstructive pulmonary disease) ([place])   . Dementia   . Depression   . Myocardial infarction ([place])   . Stage IV squamous cell carcinoma of left lung ([place]) 09/25/2016    Past  Surgical History:  Procedure Laterality Date  . ABDOMINAL HYSTERECTOMY    . CORONARY ARTERY BYPASS GRAFT    . OOPHORECTOMY    . TONSILLECTOMY         History of present illness and  Hospital Course:     Kindly see H&P for history of present illness and admission details, please review complete Labs, Consult reports and Test reports for all details in brief  HPI  from the history and physical done on the day of admission on admission 08/22/2017 HPI: RHODIE CIENFUEGOS is a 83 y.o. female with medical history significant for dementia, coronary artery disease, paroxysmal atrial fibrillation on Eliquis, COPD, hypothyroidism, chronic kidney disease stage III, chronic diastolic CHF, and lung cancer, now presenting from her nursing facility after she was found down on the floor unresponsive with CBG 30.  Patient was found to be unresponsive on the floor at her residence last night, EMS was called and she was found to have CBG of 30.  She was treated with dextrose and transported to the ED.  Patient is unable to contribute to the history due to her clinical condition.  She seemed to be in her usual state earlier in the day yesterday and had not complained of anything.  ED Course: Upon arrival to the ED, patient is found to be hypothermic with temperature 33.9 C, saturating adequately on room air, and with vitals otherwise normal.  EKG features a sinus or ectopic atrial rhythm with chronic LBBB and QTc interval of 524 ms.  Chest x-ray is negative for acute findings and noncontrast head CT is negative for acute intracranial abnormality.  Chemistry panel is notable for creatinine 1.36, similar to priors.  CBC features a leukocytosis to 11,200 and a stable anemia with hemoglobin 10.3.  Urinalysis is unremarkable.  Troponin is normal.  Blood cultures were collected, warming blankets applied, and the patient was started on empiric vancomycin, cefepime, and Flagyl in the ED.  Her son arrived in the ED, indicated  that he was the power of attorney, rescinded her DNR while acknowledging that the patient herself did not want to be resuscitated, and left.  Patient remains somnolent, hemodynamically stable, in no apparent respiratory distress, and will be admitted for ongoing evaluation and management.    Hospital Course   83 y.o.femalewith medical history significant fordementia, coronary artery disease, paroxysmal atrial fibrillation on Eliquis, COPD, hypothyroidism, chronic kidney disease stage III, chronic diastolic CHF, and lung cancer, now presenting from her nursing facility after she was found down on the floor unresponsive with CBG 30, and hypothermia.  Admitted to stepdown unit, started on dextrose drip, was on warming blanket, hypothermia resolved from day 1, she kept requiring D10W for 3 days , hypoglycemia has resolved, D10W requirement for last 2 days, her hospital course was complicated by hospital delirium.   Hypothermia/hypoglycemia  -Hypothermia resolved from day 1, required few hours of bear hugger.  No recurrence -D10W for hypoglycemia, for the first 3 days, no D10W for last 2 days, she still with poor appetite overall, but she was encouraged to drink Ensure in between, no recurrence of hypoglycemia . - cortisol within normal limits,. -Plaquenil might be contributing as it is known to cause hypoglycemia, has been held on admission, will be held on discharge as well -No evidence of infectious etiology, initially she was on empiric antibiotic coverage, which we have stopped as her septic work-up is negative. -Clinically insulinoma is very low probability, especially with normal A1c at 5.3, and her hypoglycemia was acute, has resolved with no recurrence, her C-peptide came back elevated at 24, rest of work-up still pending including-urea panel pending, insulin/proinsulin ratio is pending as well, please follow on the work-up, and if it is positive, she will need to follow with endocrinology as  an outpatient.  SIRS -Physiology of SIRS has resolved, no evidence of infection, currently off antibiotics.  Acute encephalopathy -Head CT is negative for acute findings -Was initially lethargic, but this has improved after normalizing of her temperature, her hospital course was complicated by hospital delirium, it did improve . -There is some baseline dementia .  COPD exacerbation -He had some significant wheezing, was on IV steroids, this has significantly improved, she will be discharged on prednisone taper, -Continue ICS/LABA, Spiriva, and prn albuterol   Hypothyroidism -TSH elevated at 8.2, free T4 is low at 0.5, she is on Synthroid 88 mcg daily, increased hair Synthroid to 112 mcg labs in 6 to 8 weeks   dailyStage IV adenocarcinoma of lung -Diagnosed in 2015 and treated with chemotherapy until May 2016 when it was stopped d/t intolerance -She continues to follow with oncology for observation -  No evidence for recurrence on recent CT, planned for repeat scan in 6 months  Paroxysmal atrial fibrillation -CHADS-VASc at least 5 (age x2, gender, CHF, CAD) -Continue with Eliquis, dose adjusted,  CAD -Hx of CABG, chronic LBBB -Troponin is normal in ED -Resume statin once she is more appropriate for oral intake  CKD stage III  -Baseline - Renally-dose medications   Chronic diastolic CHF  - Appears compensated , he is resumed on lower dose Lasix on discharge    Discharge Condition:  stable   Follow UP      Discharge Instructions  and  Discharge Medications      Allergies as of 08/26/2017      Reactions   Sulfa Antibiotics Other (See Comments)   unknown   Contrast Media [iodinated Diagnostic Agents] Itching, Rash      Medication List    STOP taking these medications   hydroxychloroquine 200 MG tablet Commonly known as:  PLAQUENIL   traMADol 50 MG tablet Commonly known as:  ULTRAM     TAKE these medications     acetaminophen 650 MG CR tablet Commonly known as:  TYLENOL Take 650 mg by mouth every 6 (six) hours.   albuterol 108 (90 Base) MCG/ACT inhaler Commonly known as:  PROVENTIL HFA;VENTOLIN HFA Inhale 2 puffs into the lungs every 6 (six) hours as needed for wheezing or shortness of breath.   apixaban 2.5 MG Tabs tablet Commonly known as:  ELIQUIS Take 1 tablet (2.5 mg total) by mouth 2 (two) times daily. What changed:    medication strength  how much to take   atorvastatin 40 MG tablet Commonly known as:  LIPITOR Take 1 tablet (40 mg total) by mouth daily.   benzonatate 100 MG capsule Commonly known as:  TESSALON Take 1 capsule (100 mg total) by mouth 3 (three) times daily as needed for cough.   budesonide-formoterol 160-4.5 MCG/ACT inhaler Commonly known as:  SYMBICORT Inhale 2 puffs into the lungs 2 (two) times daily.   clonazePAM 0.5 MG tablet Commonly known as:  KLONOPIN Take 1.5 tablets (0.75 mg total) by mouth at bedtime. What changed:  additional instructions   clonazePAM 0.5 MG tablet Commonly known as:  KLONOPIN Take 1 tablet (0.5 mg total) by mouth daily as needed for anxiety. What changed:  You were already taking a medication with the same name, and this prescription was added. Make sure you understand how and when to take each.   Coenzyme Q-10 200 MG Caps Take 200 mg by mouth 2 (two) times daily.   feeding supplement (ENSURE ENLIVE) Liqd Take 237 mLs by mouth 3 (three) times daily between meals.   furosemide 40 MG tablet Commonly known as:  LASIX Take 0.5 tablets (20 mg total) by mouth every other day. What changed:    how much to take  when to take this   gabapentin 100 MG capsule Commonly known as:  NEURONTIN Take 100 mg by mouth 3 (three) times daily.   guaifenesin 100 MG/5ML syrup Commonly known as:  ROBITUSSIN Take 100 mg by mouth 4 (four) times daily as needed for cough.   levothyroxine 112 MCG tablet Commonly known as:  SYNTHROID,  LEVOTHROID Take 1 tablet (112 mcg total) by mouth daily before breakfast. Start taking on:  08/27/2017 What changed:    medication strength  how much to take   loperamide 2 MG tablet Commonly known as:  IMODIUM A-D Take 2 mg by mouth 4 (four) times daily as needed for diarrhea  or loose stools.   loratadine 10 MG tablet Commonly known as:  CLARITIN Take 10 mg by mouth daily.   memantine 10 MG tablet Commonly known as:  NAMENDA Take 10 mg by mouth 2 (two) times daily.   metoprolol tartrate 25 MG tablet Commonly known as:  LOPRESSOR Take 25 mg by mouth daily.   mirtazapine 15 MG tablet Commonly known as:  REMERON Take 1 tablet (15 mg total) by mouth at bedtime. What changed:  how much to take   pantoprazole 40 MG tablet Commonly known as:  PROTONIX Take 40 mg by mouth daily.   polyethylene glycol packet Commonly known as:  MIRALAX / GLYCOLAX Take 17 g by mouth daily as needed for mild constipation.   predniSONE 5 MG (21) Tbpk tablet Commonly known as:  STERAPRED UNI-PAK 21 TAB Use per package instruction , 21 tablets, 5 mg prednisone taper   sodium chloride 1 g tablet Take 1 g by mouth 2 (two) times daily with a meal.   tiotropium 18 MCG inhalation capsule Commonly known as:  SPIRIVA Place 18 mcg into inhaler and inhale daily.         Diet and Activity recommendation: See Discharge Instructions above   Consults obtained -  none   Major procedures and Radiology Reports - PLEASE review detailed and final reports for all details, in brief -      Ct Head Wo Contrast  Result Date: 08/22/2017 CLINICAL DATA:  Altered mental status. Found on the floor. Posttraumatic headache. Hypoglycemia. EXAM: CT HEAD WITHOUT CONTRAST TECHNIQUE: Contiguous axial images were obtained from the base of the skull through the vertex without intravenous contrast. COMPARISON:  None. FINDINGS: Brain: Diffuse cerebral atrophy. Ventricular dilatation consistent with central atrophy.  Low-attenuation changes throughout the deep white matter consistent with small vessel ischemia. Diffusely prominent CSF space likely due to atrophy. No mass effect or midline shift. No abnormal extra-axial fluid collections. Gray-white matter junctions are distinct. Basal cisterns are not effaced. No acute intracranial hemorrhage. Vascular: Intracranial arterial vascular calcifications are present. Skull: Calvarium appears intact. Sinuses/Orbits: Paranasal sinuses and mastoid air cells are clear. Other: None. IMPRESSION: No acute intracranial abnormalities. Chronic atrophy and small vessel ischemic changes. Electronically Signed   By: Lucienne Capers M.D.   On: 08/22/2017 01:48   Dg Chest Portable 1 View  Result Date: 08/22/2017 CLINICAL DATA:  Hypoglycemia EXAM: PORTABLE CHEST 1 VIEW COMPARISON:  07/04/2017 FINDINGS: Cardiac shadow is within normal limits. Postsurgical changes are noted. Left chest wall port is noted in satisfactory position. Aortic calcifications are noted. Multiple calcified hilar lymph nodes are seen. The lungs are clear bilaterally. Old rib fractures are noted on the right. IMPRESSION: Chronic changes without acute abnormality. Electronically Signed   By: Inez Catalina M.D.   On: 08/22/2017 01:06    Micro Results     Recent Results (from the past 240 hour(s))  Blood Culture (routine x 2)     Status: None (Preliminary result)   Collection Time: 08/22/17  1:29 AM  Result Value Ref Range Status   Specimen Description BLOOD RIGHT WRIST  Final   Special Requests   Final    BOTTLES DRAWN AEROBIC AND ANAEROBIC Blood Culture adequate volume   Culture   Final    NO GROWTH 4 DAYS Performed at [facility], [address] [place], [place] [address]    Report Status PENDING  Incomplete  Blood Culture (routine x 2)     Status: None (Preliminary result)  Collection Time: 08/22/17  1:55 AM  Result Value Ref Range Status   Specimen Description BLOOD RIGHT FOREARM  Final    Special Requests   Final    BOTTLES DRAWN AEROBIC AND ANAEROBIC Blood Culture results may not be optimal due to an inadequate volume of blood received in culture bottles   Culture   Final    NO GROWTH 4 DAYS Performed at [facility], [place] [address], [place], [place] [address]    Report Status PENDING  Incomplete  MRSA PCR Screening     Status: None   Collection Time: 08/22/17  6:24 AM  Result Value Ref Range Status   MRSA by PCR NEGATIVE NEGATIVE Final    Comment:        The GeneXpert MRSA Assay (FDA approved for NASAL specimens only), is one component of a comprehensive MRSA colonization surveillance program. It is not intended to diagnose MRSA infection nor to guide or monitor treatment for MRSA infections. Performed at [facility], [place] [address] [place], [place] [address]   Gastrointestinal Panel by PCR , Stool     Status: None   Collection Time: 08/22/17  6:24 AM  Result Value Ref Range Status   Campylobacter species NOT DETECTED NOT DETECTED Final   Plesimonas shigelloides NOT DETECTED NOT DETECTED Final   Salmonella species NOT DETECTED NOT DETECTED Final   Yersinia enterocolitica NOT DETECTED NOT DETECTED Final   Vibrio species NOT DETECTED NOT DETECTED Final   Vibrio cholerae NOT DETECTED NOT DETECTED Final   Enteroaggregative E coli (EAEC) NOT DETECTED NOT DETECTED Final   Enteropathogenic E coli (EPEC) NOT DETECTED NOT DETECTED Final   Enterotoxigenic E coli (ETEC) NOT DETECTED NOT DETECTED Final   Shiga like toxin producing E coli (STEC) NOT DETECTED NOT DETECTED Final   Shigella/Enteroinvasive E coli (EIEC) NOT DETECTED NOT DETECTED Final   Cryptosporidium NOT DETECTED NOT DETECTED Final   Cyclospora cayetanensis NOT DETECTED NOT DETECTED Final   Entamoeba histolytica NOT DETECTED NOT DETECTED Final   Giardia lamblia NOT DETECTED NOT DETECTED Final   Adenovirus F40/41 NOT DETECTED NOT DETECTED Final   Astrovirus NOT DETECTED NOT DETECTED Final    Norovirus GI/GII NOT DETECTED NOT DETECTED Final   Rotavirus A NOT DETECTED NOT DETECTED Final   Sapovirus (I, II, IV, and V) NOT DETECTED NOT DETECTED Final    Comment: Performed at [facility], [address] [place], [place] [address]       Today   Subjective:   Lala Been today has no headache,no chest or  abdominal pain ,she had a good night sleep. Objective:   Blood pressure 124/67, pulse 62, temperature 98.1 F (36.7 C), temperature source Oral, resp. rate 15, height 5\' 5"  (1.651 m), weight 49.3 kg, SpO2 94 %.   Intake/Output Summary (Last 24 hours) at 08/26/2017 1427 Last data filed at 08/26/2017 1030 Gross per 24 hour  Intake 300 ml  Output -  Net 300 ml    Exam  Awake,frail , confused Symmetrical Chest wall movement, Good air movement bilaterally, CTAB RRR,No Gallops,Rubs or new Murmurs, No Parasternal Heave +ve B.Sounds, Abd Soft, No tenderness, No rebound - guarding or rigidity. No Cyanosis, Clubbing or edema, No new Rash or bruise     Data Review   CBC w Diff:  Lab Results  Component Value Date   WBC 9.7 08/24/2017   HGB 9.4 (L) 08/24/2017   HGB 10.5 (L) 07/04/2017   HCT 29.2 (L) 08/24/2017  PLT 267 08/24/2017   PLT 267 07/04/2017   LYMPHOPCT 4 08/22/2017   MONOPCT 6 08/22/2017   EOSPCT 1 08/22/2017   BASOPCT 0 08/22/2017    CMP:  Lab Results  Component Value Date   NA 138 08/24/2017   K 3.7 08/24/2017   CL 104 08/24/2017   CO2 22 08/24/2017   BUN 9 08/24/2017   CREATININE 1.10 (H) 08/24/2017   CREATININE 1.54 (H) 07/04/2017   PROT 6.1 (L) 08/22/2017   PROT 6.7 10/31/2016   ALBUMIN 3.5 08/22/2017   ALBUMIN 4.4 10/31/2016   BILITOT 0.4 08/22/2017   BILITOT 0.3 07/04/2017   ALKPHOS 62 08/22/2017   AST 33 08/22/2017   AST 24 07/04/2017   ALT 24 08/22/2017   ALT 27 07/04/2017  .   Total Time in preparing paper work, data evaluation and todays exam - 82 minutes  Phillips Climes M.D on 08/26/2017 at 2:27 PM  [facility]   Office  [phone]

## 2017-08-26 NOTE — Progress Notes (Signed)
Patient will DC to: [facility] Anticipated DC date: 08/26/17 Family notified: Son-David Transport by: Corey Harold 4:15pm   Per MD patient ready for DC to [facility]. RN, patient, patient's family, and facility notified of DC. Discharge Summary sent to facility. RN given number for report [phone]). DC packet on chart. Ambulance transport requested for patient.   CSW signing off.  Cedric Fishman, LCSW Clinical Social Worker [phone]

## 2017-08-26 NOTE — Clinical Social Work Placement (Signed)
   CLINICAL SOCIAL WORK PLACEMENT  NOTE  Date:  08/26/2017  Patient Details  Name: Kristin Peterson MRN: [id number] Date of Birth: [birth year]  Clinical Social Work is seeking post-discharge placement for this patient at the [place] level of care (*CSW will initial, date and re-position this form in  chart as items are completed):  Yes   Patient/family provided with [facility] Work Department's list of facilities offering this level of care within the geographic area requested by the patient (or if unable, by the patient's family).  Yes   Patient/family informed of their freedom to choose among providers that offer the needed level of care, that participate in Medicare, Medicaid or managed care program needed by the patient, have an available bed and are willing to accept the patient.  Yes   Patient/family informed of [facility]'s ownership interest in [facility] and [facility], as well as of the fact that they are under no obligation to receive care at these facilities.  PASRR submitted to EDS on 08/26/17     PASRR number received on 08/26/17     Existing PASRR number confirmed on       FL2 transmitted to all facilities in geographic area requested by pt/family on 08/26/17     FL2 transmitted to all facilities within larger geographic area on       Patient informed that his/her managed care company has contracts with or will negotiate with certain facilities, including the following:        Yes   Patient/family informed of bed offers received.  Patient chooses bed at Other - please specify in the comment section below:([facility])     Physician recommends and patient chooses bed at      Patient to be transferred to Other - please specify in the comment section below:([facility]) on 08/26/17.  Patient to be transferred to facility by [facility]     Patient family notified on 08/26/17 of transfer.  Name of family member notified:   Son-David     PHYSICIAN       Additional Comment:    _______________________________________________ Benard Halsted, [place] 08/26/2017, 2:33 PM

## 2017-08-26 NOTE — Care Management Important Message (Signed)
Important Message  Patient Details  Name: Kristin Peterson MRN: [id number] Date of Birth: July 07, 1934   Medicare Important Message Given:  Yes    Floride Hutmacher Montine Circle 08/26/2017, 4:16 PM

## 2017-08-26 NOTE — Progress Notes (Signed)
[facility] to be D/C'd [facility] per MD order.  Discussed with the patient and all questions fully answered.  VSS, Skin clean, dry and intact without evidence of skin break down, no evidence of skin tears noted. IV catheter discontinued intact. Site without signs and symptoms of complications. Dressing and pressure applied.  An After Visit Summary was printed and given to the patient. Prescriptions given to patient as well. RN attempted report 3x to receiving RN.  D/c education completed with patient/family including follow up instructions, medication list, d/c activities limitations if indicated, with other d/c instructions as indicated by MD - patient able to verbalize understanding, all questions fully answered.   Patient instructed to return to ED, call 911, or call MD for any changes in condition.   Patient escorted via Alroy Bailiff 08/26/2017 4:18 PM

## 2017-08-26 NOTE — Progress Notes (Signed)
Pt resting in bed with no acute events noted this shift. Staff continues to re-direct and re-oriented pt. All fall precautions enforced. Pt with anxiety earlier in the shift & PRN Ativan given with relief

## 2017-08-26 NOTE — Progress Notes (Addendum)
11:34am-Pasrr [phone] E. Patient able to discharge to SNF today.    11:22am-CSW submitted requested documents to pasrr, awaiting number.   Percell Locus Rebekka Lobello LCSW [phone]

## 2017-08-27 LAB — CULTURE, BLOOD (ROUTINE X 2)
CULTURE: NO GROWTH
Culture: NO GROWTH
Special Requests: ADEQUATE

## 2017-08-29 LAB — PROINSULIN/INSULIN RATIO
Insulin: 112 u[IU]/mL — ABNORMAL HIGH
PROINSULIN/INSULIN RATIO: 41 %
Proinsulin: 310 pmol/L

## 2017-08-29 LAB — SULFONYLUREA HYPOGLYCEMICS PANEL, SERUM
Acetohexamide: NEGATIVE ug/mL (ref 20–60)
CHLORPROPAMIDE: NEGATIVE ug/mL (ref 75–250)
GLIPIZIDE: 438 ng/mL (ref 200–1000)
GLYBURIDE: NEGATIVE ng/mL
Glimepiride: NEGATIVE ng/mL (ref 80–250)
NATEGLINIDE: NEGATIVE ng/mL
REPAGLINIDE: NEGATIVE ng/mL
Tolazamide: NEGATIVE ug/mL
Tolbutamide: NEGATIVE ug/mL (ref 40–100)

## 2017-09-16 ENCOUNTER — Encounter (HOSPITAL_COMMUNITY): Payer: Self-pay | Admitting: Emergency Medicine

## 2017-09-16 ENCOUNTER — Other Ambulatory Visit: Payer: Self-pay

## 2017-09-16 ENCOUNTER — Emergency Department (HOSPITAL_COMMUNITY): Payer: Medicare Other

## 2017-09-16 ENCOUNTER — Emergency Department (HOSPITAL_COMMUNITY)
Admission: EM | Admit: 2017-09-16 | Discharge: 2017-09-16 | Disposition: A | Discharge status: Home / Self Care | Payer: Medicare Other | Attending: Emergency Medicine | Admitting: Emergency Medicine

## 2017-09-16 DIAGNOSIS — F0390 Unspecified dementia without behavioral disturbance: Secondary | ICD-10-CM | POA: Insufficient documentation

## 2017-09-16 DIAGNOSIS — N183 Chronic kidney disease, stage 3 (moderate): Secondary | ICD-10-CM | POA: Insufficient documentation

## 2017-09-16 DIAGNOSIS — R05 Cough: Secondary | ICD-10-CM | POA: Diagnosis not present

## 2017-09-16 DIAGNOSIS — Z87891 Personal history of nicotine dependence: Secondary | ICD-10-CM | POA: Diagnosis not present

## 2017-09-16 DIAGNOSIS — Z79899 Other long term (current) drug therapy: Secondary | ICD-10-CM | POA: Insufficient documentation

## 2017-09-16 DIAGNOSIS — Z951 Presence of aortocoronary bypass graft: Secondary | ICD-10-CM | POA: Diagnosis not present

## 2017-09-16 DIAGNOSIS — J449 Chronic obstructive pulmonary disease, unspecified: Secondary | ICD-10-CM | POA: Diagnosis not present

## 2017-09-16 DIAGNOSIS — R059 Cough, unspecified: Secondary | ICD-10-CM

## 2017-09-16 DIAGNOSIS — I2510 Atherosclerotic heart disease of native coronary artery without angina pectoris: Secondary | ICD-10-CM | POA: Diagnosis not present

## 2017-09-16 LAB — CBC WITH DIFFERENTIAL/PLATELET
BASOS ABS: 0 10*3/uL (ref 0.0–0.1)
Basophils Relative: 0 %
EOS PCT: 3 %
Eosinophils Absolute: 0.3 10*3/uL (ref 0.0–0.7)
HEMATOCRIT: 31.7 % — AB (ref 36.0–46.0)
Hemoglobin: 9.6 g/dL — ABNORMAL LOW (ref 12.0–15.0)
LYMPHS PCT: 4 %
Lymphs Abs: 0.4 10*3/uL — ABNORMAL LOW (ref 0.7–4.0)
MCH: 31.1 pg (ref 26.0–34.0)
MCHC: 30.3 g/dL (ref 30.0–36.0)
MCV: 102.6 fL — AB (ref 78.0–100.0)
Monocytes Absolute: 0.8 10*3/uL (ref 0.1–1.0)
Monocytes Relative: 8 %
NEUTROS ABS: 9.1 10*3/uL — AB (ref 1.7–7.7)
Neutrophils Relative %: 85 %
PLATELETS: 403 10*3/uL — AB (ref 150–400)
RBC: 3.09 MIL/uL — ABNORMAL LOW (ref 3.87–5.11)
RDW: 14.2 % (ref 11.5–15.5)
WBC: 10.6 10*3/uL — ABNORMAL HIGH (ref 4.0–10.5)

## 2017-09-16 LAB — BASIC METABOLIC PANEL
ANION GAP: 8 (ref 5–15)
BUN: 24 mg/dL — ABNORMAL HIGH (ref 8–23)
CO2: 34 mmol/L — ABNORMAL HIGH (ref 22–32)
Calcium: 8.8 mg/dL — ABNORMAL LOW (ref 8.9–10.3)
Chloride: 104 mmol/L (ref 98–111)
Creatinine, Ser: 1.28 mg/dL — ABNORMAL HIGH (ref 0.44–1.00)
GFR calc Af Amer: 45 mL/min — ABNORMAL LOW (ref >60)
GFR, EST NON AFRICAN AMERICAN: 38 mL/min — AB (ref >60)
Glucose, Bld: 108 mg/dL — ABNORMAL HIGH (ref 70–99)
POTASSIUM: 3.9 mmol/L (ref 3.5–5.1)
SODIUM: 146 mmol/L — AB (ref 135–145)

## 2017-09-16 LAB — CBG MONITORING, ED: Glucose-Capillary: 98 mg/dL (ref 70–99)

## 2017-09-16 NOTE — ED Provider Notes (Signed)
[facility] DEPT Provider Note   CSN: [id number] Arrival date & time: 09/16/17  1118     History   Chief Complaint Chief Complaint  Patient presents with  . Shortness of Breath  . Cough    HPI Kristin Peterson is a 83 y.o. female.  The history is provided by the patient.  Cough  This is a new problem. The current episode started yesterday. The problem occurs constantly. The problem has not changed since onset.The cough is non-productive. There has been no fever. The fever has been present for less than 1 day. Associated symptoms include shortness of breath. Pertinent negatives include no chest pain, no chills, no sweats, no weight loss, no ear congestion, no ear pain, no headaches, no rhinorrhea, no sore throat, no myalgias, no wheezing and no eye redness. She has tried nothing for the symptoms. The treatment provided no relief. She is not a smoker. Her past medical history is significant for COPD.    Past Medical History:  Diagnosis Date  . Allergy   . Anxiety   . Arthritis   . CAD (coronary artery disease)   . Cataract   . COPD (chronic obstructive pulmonary disease) ([place])   . Dementia   . Depression   . Myocardial infarction ([place])   . Stage IV squamous cell carcinoma of left lung ([place]) 09/25/2016    Patient Active Problem List   Diagnosis Date Noted  . COPD with acute exacerbation ([place])   . Hypothermia 08/22/2017  . Hypoglycemia without diagnosis of diabetes mellitus 08/22/2017  . SIRS (systemic inflammatory response syndrome) ([place]) 08/22/2017  . Hypoglycemia 08/22/2017  . Acute encephalopathy 08/22/2017  . Somnolence   . DNR (do not resuscitate)   . History of pulmonary embolus (PE) 05/27/2017  . Goals of care, counseling/discussion   . Palliative care by specialist   . Acute respiratory failure with hypoxia ([place]) 05/14/2017  . CAD (coronary artery disease) 05/14/2017  . PAF (paroxysmal atrial fibrillation) ([place]) 05/14/2017  . CKD  (chronic kidney disease) stage 3, GFR 30-59 ml/min (HCC) 05/14/2017  . Chronic obstructive pulmonary disease ([place]) 09/27/2016  . Stage IV squamous cell carcinoma of left lung ([place]) 09/25/2016    Past Surgical History:  Procedure Laterality Date  . ABDOMINAL HYSTERECTOMY    . CORONARY ARTERY BYPASS GRAFT    . OOPHORECTOMY    . TONSILLECTOMY       OB History   None      Home Medications    Prior to Admission medications   Medication Sig Start Date End Date Taking? Authorizing Provider  acetaminophen (TYLENOL) 650 MG CR tablet Take 650 mg by mouth every 6 (six) hours.    Yes [provider]  albuterol (PROVENTIL HFA;VENTOLIN HFA) 108 (90 Base) MCG/ACT inhaler Inhale 2 puffs into the lungs every 6 (six) hours as needed for wheezing or shortness of breath. 05/20/17  Yes Rai, Ripudeep K, MD  atorvastatin (LIPITOR) 40 MG tablet Take 1 tablet (40 mg total) by mouth daily. 10/04/16 10/04/17 Yes Lelon Perla, MD  benzonatate (TESSALON) 100 MG capsule Take 1 capsule (100 mg total) by mouth 3 (three) times daily as needed for cough. 05/20/17  Yes Rai, Ripudeep K, MD  budesonide-formoterol (SYMBICORT) 160-4.5 MCG/ACT inhaler Inhale 2 puffs into the lungs 2 (two) times daily.   Yes [provider]  clonazePAM (KLONOPIN) 0.5 MG tablet Take 1.5 tablets (0.75 mg total) by mouth at bedtime. 08/26/17  Yes Elgergawy, Silver Huguenin, MD  clonazePAM (KLONOPIN) 0.5 MG tablet Take 1 tablet (0.5 mg total) by mouth daily as needed for anxiety. 08/26/17 08/26/18 Yes Elgergawy, Silver Huguenin, MD  Coenzyme Q-10 200 MG CAPS Take 200 mg by mouth 2 (two) times daily.    Yes [provider]  FeFum-FePo-FA-B Cmp-C-Zn-Mn-Cu (SE-TAN PLUS PO) Take 1 capsule by mouth daily.   Yes [provider]  furosemide (LASIX) 40 MG tablet Take 0.5 tablets (20 mg total) by mouth every other day. 08/26/17  Yes Elgergawy, Silver Huguenin, MD  gabapentin (NEURONTIN) 100 MG capsule Take 100 mg by mouth 3 (three) times  daily.   Yes [provider]  guaifenesin (ROBITUSSIN) 100 MG/5ML syrup Take 100 mg by mouth 4 (four) times daily as needed for cough.   Yes [provider]  hydroxychloroquine (PLAQUENIL) 200 MG tablet Take 200 mg by mouth daily.   Yes [provider]  levothyroxine (SYNTHROID, LEVOTHROID) 112 MCG tablet Take 1 tablet (112 mcg total) by mouth daily before breakfast. 08/27/17  Yes Elgergawy, Silver Huguenin, MD  loperamide (IMODIUM A-D) 2 MG tablet Take 2 mg by mouth 4 (four) times daily as needed for diarrhea or loose stools.   Yes [provider]  loratadine (CLARITIN) 10 MG tablet Take 10 mg by mouth daily.   Yes [provider]  memantine (NAMENDA) 10 MG tablet Take 10 mg by mouth 2 (two) times daily.   Yes [provider]  metoprolol tartrate (LOPRESSOR) 25 MG tablet Take 25 mg by mouth daily.    Yes [provider]  mirtazapine (REMERON) 15 MG tablet Take 1 tablet (15 mg total) by mouth at bedtime. Patient taking differently: Take 7.5 mg by mouth at bedtime.  08/26/17  Yes Elgergawy, Silver Huguenin, MD  pantoprazole (PROTONIX) 40 MG tablet Take 40 mg by mouth daily.   Yes [provider]  polyethylene glycol (MIRALAX / GLYCOLAX) packet Take 17 g by mouth daily as needed for mild constipation.   Yes [provider]  potassium chloride (KLOR-CON) 20 MEQ packet Take 20 mEq by mouth daily.   Yes [provider]  predniSONE (DELTASONE) 5 MG tablet Take 5 mg by mouth daily.   Yes [provider]  rivastigmine (EXELON) 4.6 mg/24hr Place 4.6 mg onto the skin daily.   Yes [provider]  sertraline (ZOLOFT) 100 MG tablet Take 100 mg by mouth daily.   Yes [provider]  sodium chloride 1 g tablet Take 1 g by mouth 2 (two) times daily with a meal.   Yes [provider]  tiotropium (SPIRIVA) 18 MCG inhalation capsule Place 18 mcg into inhaler and inhale daily.   Yes [provider]    traMADol (ULTRAM) 50 MG tablet Take 50 mg by mouth 2 (two) times daily.   Yes [provider]  apixaban (ELIQUIS) 2.5 MG TABS tablet Take 1 tablet (2.5 mg total) by mouth 2 (two) times daily. Patient not taking: Reported on 09/16/2017 08/26/17   Elgergawy, Silver Huguenin, MD  feeding supplement, ENSURE ENLIVE, (ENSURE ENLIVE) LIQD Take 237 mLs by mouth 3 (three) times daily between meals. Patient not taking: Reported on 09/16/2017 08/26/17   Elgergawy, Silver Huguenin, MD  predniSONE (STERAPRED UNI-PAK 21 TAB) 5 MG (21) TBPK tablet Use per package instruction , 21 tablets, 5 mg prednisone taper Patient not taking: Reported on 09/16/2017 08/26/17   Elgergawy, Silver Huguenin, MD    Family History Family History  Problem Relation Age of Onset  . Parkinsonism Father  Social History Social History   Tobacco Use  . Smoking status: Former Smoker    Packs/day: 1.00    Years: 40.00    Pack years: 40.00    Types: Cigarettes    Last attempt to quit: 03/22/1996    Years since quitting: 21.5  . Smokeless tobacco: Never Used  Substance Use Topics  . Alcohol use: No  . Drug use: No     Allergies   Sulfa antibiotics and Contrast media [iodinated diagnostic agents]   Review of Systems Review of Systems  Constitutional: Negative for chills, fever and weight loss.  HENT: Negative for ear pain, rhinorrhea and sore throat.   Eyes: Negative for pain, redness and visual disturbance.  Respiratory: Positive for cough and shortness of breath. Negative for wheezing.   Cardiovascular: Negative for chest pain and palpitations.  Gastrointestinal: Negative for abdominal pain and vomiting.  Genitourinary: Negative for dysuria and hematuria.  Musculoskeletal: Negative for arthralgias, back pain and myalgias.  Skin: Negative for color change and rash.  Neurological: Negative for seizures, syncope and headaches.  All other systems reviewed and are negative.    Physical Exam Updated Vital Signs BP 105/66 (BP  Location: Left Arm)   Pulse 94   Temp 98.4 F (36.9 C) (Oral)   Resp 16   SpO2 98%   Physical Exam  Constitutional: She appears well-developed and well-nourished. No distress.  HENT:  Head: Normocephalic and atraumatic.  Eyes: Pupils are equal, round, and reactive to light. Conjunctivae and EOM are normal.  Neck: Normal range of motion. Neck supple.  Cardiovascular: Normal rate and regular rhythm.  No murmur heard. Pulmonary/Chest: Effort normal and breath sounds normal. No respiratory distress. She has no decreased breath sounds. She has no wheezes. She has no rhonchi. She has no rales.  Abdominal: Soft. There is no tenderness.  Musculoskeletal: She exhibits no edema.       Right lower leg: She exhibits no edema.       Left lower leg: She exhibits no edema.  Neurological: She is alert.  Skin: Skin is warm and dry. Capillary refill takes less than 2 seconds.  Psychiatric: She has a normal mood and affect.  Nursing note and vitals reviewed.    ED Treatments / Results  Labs (all labs ordered are listed, but only abnormal results are displayed) Labs Reviewed  BASIC METABOLIC PANEL - Abnormal; Notable for the following components:      Result Value   Sodium 146 (*)    CO2 34 (*)    Glucose, Bld 108 (*)    BUN 24 (*)    Creatinine, Ser 1.28 (*)    Calcium 8.8 (*)    GFR calc non Af Amer 38 (*)    GFR calc Af Amer 45 (*)    All other components within normal limits  CBC WITH DIFFERENTIAL/PLATELET - Abnormal; Notable for the following components:   WBC 10.6 (*)    RBC 3.09 (*)    Hemoglobin 9.6 (*)    HCT 31.7 (*)    MCV 102.6 (*)    Platelets 403 (*)    Neutro Abs 9.1 (*)    Lymphs Abs 0.4 (*)    All other components within normal limits  CBG MONITORING, ED    EKG EKG Interpretation  Date/Time:  Monday September 16 2017 11:33:46 EDT Ventricular Rate:  102 PR Interval:    QRS Duration: 126 QT Interval:  389 QTC Calculation: 507 R Axis:   101 Text  Interpretation:  Atrial fibrillation Consider left ventricular hypertrophy Prolonged QT interval Confirmed by Lennice Sites [phone]) on 09/16/2017 11:39:54 AM   Radiology Dg Chest 2 View  Result Date: 09/16/2017 CLINICAL DATA:  Cough and weakness.  Lethargy. EXAM: CHEST - 2 VIEW COMPARISON:  August 22, 2017 FINDINGS: There is no edema or consolidation. The heart is upper normal in size with pulmonary vascularity normal. There is aortic atherosclerosis. Port-A-Cath tip is in the superior vena cava. No pneumothorax. No evident bone lesions. IMPRESSION: No edema or consolidation. Heart upper normal in size. There is aortic atherosclerosis. Aortic Atherosclerosis (ICD10-I70.0). Electronically Signed   By: Lowella Grip III M.D.   On: 09/16/2017 12:15    Procedures Procedures (including critical care time)  Medications Ordered in ED Medications - No data to display   Initial Impression / Assessment and Plan / ED Course  I have reviewed the triage vital signs and the nursing notes.  Pertinent labs & imaging results that were available during my care of the patient were reviewed by me and considered in my medical decision making (see chart for details).     Kristin Peterson is a 83 year old female with history of anxiety, CAD, COPD, dementia who presents to the ED with cough.  Patient with normal vitals.  No fever.  Patient with cough for the last several days but no sputum production.  Denies any chest pain, abdominal pain.  Patient is stable on her home 2 L of oxygen.  No recent fevers.  Patient sent from nursing home for evaluation.  Patient with clear breath sounds on exam.  No peripheral edema.  Patient is on anticoagulation for paroxysmal atrial fibrillation.  Patient with overall unremarkable exam.  Blood work showed no significant anemia, electrolyte abnormality, kidney injury.  Chest x-ray showed no signs of pneumonia, pneumothorax, pleural effusion.  Patient EKG that showed atrial  fibrillation but no signs of RVR.  Patient overall with no signs to suggest to suggest PE at this time especially as patient is on anticoagulation.  No hypoxia.  Patient possibly with viral process.  Discharged from the ED in good condition and told to return to the ED if symptoms worsen.  This chart was dictated using voice recognition software.  Despite best efforts to proofread,  errors can occur which can change the documentation meaning.   Final Clinical Impressions(s) / ED Diagnoses   Final diagnoses:  Cough    ED Discharge Orders    None       Lennice Sites, DO 09/16/17 1333

## 2017-09-16 NOTE — ED Triage Notes (Signed)
Per EMS, patient from [facility], c/o cough and SOB since yesterday. Hx COPD and dementia. 2L [place] at baseline.

## 2017-09-16 NOTE — ED Notes (Signed)
ED Provider at bedside. 

## 2017-09-16 NOTE — ED Notes (Signed)
Family at bedside. 

## 2017-09-16 NOTE — ED Notes (Signed)
[facility] called for transport.  

## 2017-09-16 NOTE — ED Notes (Signed)
Bed: [facility] Expected date:  Expected time:  Means of arrival:  Comments: 

## 2017-09-16 NOTE — ED Notes (Signed)
Per request, [facility] made aware of [facility] arrival to transport patient back to facility.

## 2017-09-16 NOTE — ED Notes (Signed)
Bed: NG29 Expected date:  Expected time:  Means of arrival:  Comments: EMS cough from nursing home

## 2017-09-16 NOTE — ED Notes (Signed)
Patient given tea.

## 2017-09-16 NOTE — ED Notes (Signed)
Patient transported to X-ray 

## 2017-09-16 NOTE — ED Notes (Addendum)
Nita, patient's daughter-in-law, requesting call when patient is transported. [phone]

## 2017-09-18 ENCOUNTER — Telehealth: Payer: Self-pay

## 2017-09-18 NOTE — Telephone Encounter (Signed)
Received message from Jacklyn Shell, NP, with update on patient and request for Palliative NP to visit patient. Will alert Colletta [place] NP

## 2017-09-19 ENCOUNTER — Non-Acute Institutional Stay: Payer: Medicare Other | Admitting: Nurse Practitioner

## 2017-09-19 DIAGNOSIS — F419 Anxiety disorder, unspecified: Secondary | ICD-10-CM

## 2017-09-19 DIAGNOSIS — R0602 Shortness of breath: Secondary | ICD-10-CM

## 2017-09-19 DIAGNOSIS — F0390 Unspecified dementia without behavioral disturbance: Secondary | ICD-10-CM

## 2017-09-19 DIAGNOSIS — R634 Abnormal weight loss: Secondary | ICD-10-CM

## 2017-09-19 DIAGNOSIS — Z515 Encounter for palliative care: Secondary | ICD-10-CM

## 2017-09-19 DIAGNOSIS — R269 Unspecified abnormalities of gait and mobility: Secondary | ICD-10-CM

## 2017-09-20 ENCOUNTER — Non-Acute Institutional Stay: Payer: Medicare Other | Admitting: Nurse Practitioner

## 2017-09-20 DIAGNOSIS — Z515 Encounter for palliative care: Secondary | ICD-10-CM

## 2017-09-20 DIAGNOSIS — R05 Cough: Secondary | ICD-10-CM

## 2017-09-20 DIAGNOSIS — E162 Hypoglycemia, unspecified: Secondary | ICD-10-CM

## 2017-09-20 DIAGNOSIS — R059 Cough, unspecified: Secondary | ICD-10-CM

## 2017-09-20 NOTE — Progress Notes (Signed)
PALLIATIVE CARE CONSULT VISIT   PATIENT NAME: Kristin Peterson DOB: 1934-01-24 MRN: [id number]  PRIMARY CARE PROVIDER:   [facility]  REFERRING PROVIDER:  [facility] [address] [place], [place] [address]  RESPONSIBLE PARTY:   Shanon Brow (son) [phone]    CC:"I feel good" "iam wide eyed and bushy tailed"   ASSESSMENT/RECOMMENDATIONS and PLAN:  1. Chronic oxygen dependent  hypoxic respiratory failure: opatient reports no change in work of breathing and denies shortness of breath or change in sputum  I spent 15 minutes providing this consultation,  from 11:45 to 12:00. More than 50% of the time in this consultation was spent coordinating communication.   HISTORY OF PRESENT ILLNESS:  Kristin Peterson is a 83 y.o. year old female with multiple medical problems including oxygen dependent respiratory failure. Palliative Care was asked to help address symptom management and goals of care.   Patient to ED on 09/16/17 with cc of cough: patient says cough has resolved. To ED 08/22/17 for "hypogycemia" patient is not diabetic and reports she eats well. ED discontinued plaquenil as possible cause of hypoglycemia.  CODE STATUS: DNR  PPS: 30% HOSPICE ELIGIBILITY/DIAGNOSIS: TBD  PAST MEDICAL HISTORY:  Past Medical History:  Diagnosis Date  . Allergy   . Anxiety   . Arthritis   . CAD (coronary artery disease)   . Cataract   . COPD (chronic obstructive pulmonary disease) ([place])   . Dementia   . Depression   . Myocardial infarction ([place])   . Stage IV squamous cell carcinoma of left lung ([place]) 09/25/2016    SOCIAL HX:  Social History   Tobacco Use  . Smoking status: Former Smoker    Packs/day: 1.00    Years: 40.00    Pack years: 40.00    Types: Cigarettes    Last attempt to quit: 03/22/1996    Years since quitting: 21.5  . Smokeless tobacco: Never Used  Substance Use Topics  . Alcohol use: No    ALLERGIES:  Allergies  Allergen  Reactions  . Sulfa Antibiotics Other (See Comments)    unknown  . Contrast Media [Iodinated Diagnostic Agents] Itching and Rash     PERTINENT MEDICATIONS:  Outpatient Encounter Medications as of 09/20/2017  Medication Sig  . acetaminophen (TYLENOL) 650 MG CR tablet Take 650 mg by mouth every 6 (six) hours.   Marland Kitchen albuterol (PROVENTIL HFA;VENTOLIN HFA) 108 (90 Base) MCG/ACT inhaler Inhale 2 puffs into the lungs every 6 (six) hours as needed for wheezing or shortness of breath.  Marland Kitchen apixaban (ELIQUIS) 2.5 MG TABS tablet Take 1 tablet (2.5 mg total) by mouth 2 (two) times daily. (Patient not taking: Reported on 09/16/2017)  . atorvastatin (LIPITOR) 40 MG tablet Take 1 tablet (40 mg total) by mouth daily.  . benzonatate (TESSALON) 100 MG capsule Take 1 capsule (100 mg total) by mouth 3 (three) times daily as needed for cough.  . budesonide-formoterol (SYMBICORT) 160-4.5 MCG/ACT inhaler Inhale 2 puffs into the lungs 2 (two) times daily.  . clonazePAM (KLONOPIN) 0.5 MG tablet Take 1.5 tablets (0.75 mg total) by mouth at bedtime.  . clonazePAM (KLONOPIN) 0.5 MG tablet Take 1 tablet (0.5 mg total) by mouth daily as needed for anxiety.  . Coenzyme Q-10 200 MG CAPS Take 200 mg by mouth 2 (two) times daily.   . feeding supplement, ENSURE ENLIVE, (ENSURE ENLIVE) LIQD Take 237 mLs by mouth 3 (three) times daily between meals. (Patient not taking: Reported on 09/16/2017)  .  FeFum-FePo-FA-B Cmp-C-Zn-Mn-Cu (SE-TAN PLUS PO) Take 1 capsule by mouth daily.  . furosemide (LASIX) 40 MG tablet Take 0.5 tablets (20 mg total) by mouth every other day.  . gabapentin (NEURONTIN) 100 MG capsule Take 100 mg by mouth 3 (three) times daily.  Marland Kitchen guaifenesin (ROBITUSSIN) 100 MG/5ML syrup Take 100 mg by mouth 4 (four) times daily as needed for cough.  . hydroxychloroquine (PLAQUENIL) 200 MG tablet Take 200 mg by mouth daily.  Marland Kitchen levothyroxine (SYNTHROID, LEVOTHROID) 112 MCG tablet Take 1 tablet (112 mcg total) by mouth daily before  breakfast.  . loperamide (IMODIUM A-D) 2 MG tablet Take 2 mg by mouth 4 (four) times daily as needed for diarrhea or loose stools.  Marland Kitchen loratadine (CLARITIN) 10 MG tablet Take 10 mg by mouth daily.  . memantine (NAMENDA) 10 MG tablet Take 10 mg by mouth 2 (two) times daily.  . metoprolol tartrate (LOPRESSOR) 25 MG tablet Take 25 mg by mouth daily.   . mirtazapine (REMERON) 15 MG tablet Take 1 tablet (15 mg total) by mouth at bedtime. (Patient taking differently: Take 7.5 mg by mouth at bedtime. )  . pantoprazole (PROTONIX) 40 MG tablet Take 40 mg by mouth daily.  . polyethylene glycol (MIRALAX / GLYCOLAX) packet Take 17 g by mouth daily as needed for mild constipation.  . potassium chloride (KLOR-CON) 20 MEQ packet Take 20 mEq by mouth daily.  . predniSONE (DELTASONE) 5 MG tablet Take 5 mg by mouth daily.  . predniSONE (STERAPRED UNI-PAK 21 TAB) 5 MG (21) TBPK tablet Use per package instruction , 21 tablets, 5 mg prednisone taper (Patient not taking: Reported on 09/16/2017)  . rivastigmine (EXELON) 4.6 mg/24hr Place 4.6 mg onto the skin daily.  . sertraline (ZOLOFT) 100 MG tablet Take 100 mg by mouth daily.  . sodium chloride 1 g tablet Take 1 g by mouth 2 (two) times daily with a meal.  . tiotropium (SPIRIVA) 18 MCG inhalation capsule Place 18 mcg into inhaler and inhale daily.  . traMADol (ULTRAM) 50 MG tablet Take 50 mg by mouth 2 (two) times daily.   No facility-administered encounter medications on file as of 09/20/2017.     PHYSICAL EXAM:   General: NAD,  Thin, sitting up in recliner smiling and watching TV Cardiovascular: regular rate and rhythm Pulmonary: clear ant fields Abdomen: soft, nontender, + bowel sounds GU: no suprapubic tenderness Extremities: no edema, no joint deformities Skin: no rashes Neurological: Weakness but otherwise nonfocal  Molly Maselli G [place], NP

## 2017-10-10 ENCOUNTER — Encounter: Payer: Self-pay | Admitting: Nurse Practitioner

## 2017-10-10 NOTE — Progress Notes (Signed)
PALLIATIVE CARE CONSULT VISIT   PATIENT NAME: Kristin Peterson DOB: 12/08/1934 MRN: [id number]   [facility]   PRIMARY CARE PROVIDER:   [facility]  REFERRING PROVIDER:  [facility] [place], [place] [address]  RESPONSIBLE PARTY:   Kristin Peterson (son) [phone] (home)  [phone] (mobile)  HISTORY OF PRESENT ILLNESS: Kristin Peterson is a 83 y.o.  female with multiple medical problems including recent hospitalization for PE/pna, ongoing COPD and previous hx of lung CA. Palliative Care was asked to help address symptom management and goals of care.    Patient denies any acute issues at this time.  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 Kirk Peterson -furosemide -gabapentin  ACP -DNR    I spent 45 minutes providing this consultation,  from 15:00 to 15:45. More than 50% of the time in this consultation was spent coordinating communication.    CODE STATUS: DNR  PPS: 30% HOSPICE ELIGIBILITY/DIAGNOSIS: TBD  PAST MEDICAL HISTORY:  Past Medical History:  Diagnosis Date  . Allergy   . Anxiety   . Arthritis   . CAD (coronary artery disease)   . Cataract   . COPD (chronic obstructive pulmonary disease) ([place])   . Dementia   . Depression   . Myocardial infarction ([place])   . Stage IV squamous cell carcinoma of left lung ([place]) 09/25/2016    SOCIAL HX:    Social History   Tobacco Use  . Smoking status: Former Smoker    Packs/day: 1.00    Years: 40.00    Pack years: 40.00    Types: Cigarettes    Last attempt to quit: 03/22/1996    Years since quitting: 21.5  . Smokeless tobacco: Never Used  Substance Use Topics  . Alcohol use: No    ALLERGIES:  Allergies  Allergen Reactions  . Sulfa Antibiotics Other (See Comments)    unknown  . Contrast Kristin [Iodinated Diagnostic Agents] Itching and Rash     PERTINENT MEDICATIONS:  Outpatient Encounter Medications as of 09/19/2017  Medication Sig  . acetaminophen (TYLENOL) 650 MG CR tablet Take 650 mg by mouth every 6 (six) hours.   Marland Kitchen albuterol (PROVENTIL HFA;VENTOLIN HFA) 108 (90 Base) MCG/ACT inhaler Inhale 2 puffs into the lungs every 6 (six) hours as needed for wheezing or shortness of breath.  Marland Kitchen apixaban (ELIQUIS) 2.5 MG TABS tablet Take 1 tablet (2.5 mg total) by mouth 2 (two) times daily.  Marland Kitchen atorvastatin (LIPITOR) 40 MG tablet Take 1 tablet (40 mg total) by mouth daily.  . benzonatate (TESSALON) 100 MG capsule Take 1 capsule (100 mg total) by mouth 3 (three) times daily as needed for cough.  . budesonide-formoterol (SYMBICORT) 160-4.5 MCG/ACT inhaler Inhale 2 puffs into the lungs 2 (two) times daily.  . clonazePAM (KLONOPIN) 0.5 MG tablet Take 1.5 tablets (0.75 mg total) by mouth at bedtime.  . clonazePAM (KLONOPIN) 0.5 MG tablet Take 1 tablet (0.5 mg total) by mouth daily as needed for anxiety.  . Coenzyme Q-10 200  MG CAPS Take 200 mg by mouth 2 (two) times daily.   . feeding supplement, ENSURE ENLIVE, (ENSURE ENLIVE) LIQD Take 237 mLs by mouth 3 (three) times daily between meals.  . FeFum-FePo-FA-B Cmp-C-Zn-Mn-Cu (SE-TAN PLUS PO) Take 1 capsule by mouth daily.  . furosemide (LASIX) 40 MG tablet Take 0.5 tablets (20 mg total) by mouth every other day.  . gabapentin (NEURONTIN) 100 MG capsule Take 100 mg by mouth 3 (three) times daily.  Marland Kitchen guaifenesin (ROBITUSSIN) 100 MG/5ML syrup Take 100  mg by mouth 4 (four) times daily as needed for cough.  . hydroxychloroquine (PLAQUENIL) 200 MG tablet Take 200 mg by mouth daily.  Marland Kitchen levothyroxine (SYNTHROID, LEVOTHROID) 112 MCG tablet Take 1 tablet (112 mcg total) by mouth daily before breakfast.  . loperamide (IMODIUM A-D) 2 MG tablet Take 2 mg by mouth 4 (four) times daily as needed for diarrhea or loose stools.  Marland Kitchen loratadine (CLARITIN) 10 MG tablet Take 10 mg by mouth daily.  . memantine (NAMENDA) 10 MG tablet Take 10 mg by mouth 2 (two) times daily.  . metoprolol tartrate (LOPRESSOR) 25 MG tablet Take 25 mg by mouth daily.   . mirtazapine (REMERON) 15 MG tablet Take 1 tablet (15 mg total) by mouth at bedtime. (Patient taking differently: Take 7.5 mg by mouth at bedtime. )  . pantoprazole (PROTONIX) 40 MG tablet Take 40 mg by mouth daily.  . polyethylene glycol (MIRALAX / GLYCOLAX) packet Take 17 g by mouth daily as needed for mild constipation.  . potassium chloride (KLOR-CON) 20 MEQ packet Take 20 mEq by mouth daily.  . predniSONE (DELTASONE) 5 MG tablet Take 5 mg by mouth daily.  . predniSONE (STERAPRED UNI-PAK 21 TAB) 5 MG (21) TBPK tablet Use per package instruction , 21 tablets, 5 mg prednisone taper (Patient not taking: Reported on 09/16/2017)  . rivastigmine (EXELON) 4.6 mg/24hr Place 4.6 mg onto the skin daily.  . sertraline (ZOLOFT) 100 MG tablet Take 100 mg by mouth daily.  . sodium chloride 1 g tablet Take 1 g by mouth 2 (two) times daily with a meal.  . tiotropium (SPIRIVA) 18 MCG inhalation capsule Place 18 mcg into inhaler and inhale daily.  . traMADol (ULTRAM) 50 MG tablet Take 50 mg by mouth 2 (two) times daily.   No facility-administered encounter medications on file as of 09/19/2017.     PHYSICAL EXAM:   General: NAD, thin Cardiovascular: regular rate and rhythm Pulmonary: clear and diminished throughout Abdomen: soft, nontender, + bowel sounds GU: no suprapubic tenderness Extremities: no edema, no joint  deformities Skin: no rashes Neurological: generalized Weakness but otherwise nonfocal; oriented x3  Kristin Crookshanks G [place], NP

## 2017-12-14 ENCOUNTER — Emergency Department (HOSPITAL_COMMUNITY)

## 2017-12-14 ENCOUNTER — Encounter (HOSPITAL_COMMUNITY): Payer: Self-pay

## 2017-12-14 ENCOUNTER — Emergency Department (HOSPITAL_COMMUNITY)
Admission: EM | Admit: 2017-12-14 | Discharge: 2017-12-14 | Disposition: A | Discharge status: Home / Self Care | Attending: Emergency Medicine | Admitting: Emergency Medicine

## 2017-12-14 DIAGNOSIS — J449 Chronic obstructive pulmonary disease, unspecified: Secondary | ICD-10-CM | POA: Diagnosis not present

## 2017-12-14 DIAGNOSIS — N183 Chronic kidney disease, stage 3 (moderate): Secondary | ICD-10-CM | POA: Diagnosis not present

## 2017-12-14 DIAGNOSIS — Z041 Encounter for examination and observation following transport accident: Secondary | ICD-10-CM | POA: Diagnosis present

## 2017-12-14 DIAGNOSIS — I2510 Atherosclerotic heart disease of native coronary artery without angina pectoris: Secondary | ICD-10-CM | POA: Insufficient documentation

## 2017-12-14 DIAGNOSIS — W19XXXA Unspecified fall, initial encounter: Secondary | ICD-10-CM

## 2017-12-14 DIAGNOSIS — Z87891 Personal history of nicotine dependence: Secondary | ICD-10-CM | POA: Diagnosis not present

## 2017-12-14 DIAGNOSIS — Y92129 Unspecified place in nursing home as the place of occurrence of the external cause: Secondary | ICD-10-CM

## 2017-12-14 DIAGNOSIS — F0390 Unspecified dementia without behavioral disturbance: Secondary | ICD-10-CM | POA: Diagnosis not present

## 2017-12-14 DIAGNOSIS — Z79899 Other long term (current) drug therapy: Secondary | ICD-10-CM | POA: Insufficient documentation

## 2017-12-14 LAB — CBG MONITORING, ED: GLUCOSE-CAPILLARY: 103 mg/dL — AB (ref 70–99)

## 2017-12-14 NOTE — ED Triage Notes (Signed)
Pt arrived via [facility]; per EMS, pt from [facility]; staff at facility called EMS and states that at 3a patient was found on the floor at foot of bed.Pt on blood thinner (Eliquis) Hx of AFIB, Dementia,124/74, 80-120, no CBG, 16, Pt on O2 2L via [place] extremities cold and EMS unable to get sats

## 2017-12-14 NOTE — ED Provider Notes (Addendum)
[facility] EMERGENCY DEPARTMENT Provider Note   CSN: [id number] Arrival date & time: 12/14/17  0356     History   Chief Complaint Chief Complaint  Patient presents with  . Fall    HPI Kristin Peterson is a 83 y.o. female.  The history is provided by the patient and the nursing home. The history is limited by the condition of the patient (Dementia).  She has history of COPD, paroxysmal atrial fibrillation, coronary artery disease, dementia and is anticoagulated on apixaban.  She was found on the floor at the foot of her bed at the skilled nursing center where she lives.  She is not complaining of anything, but does not remember falling or being found on the floor.  Past Medical History:  Diagnosis Date  . Allergy   . Anxiety   . Arthritis   . CAD (coronary artery disease)   . Cataract   . COPD (chronic obstructive pulmonary disease) ([place])   . Dementia ([place])   . Depression   . Myocardial infarction ([place])   . Stage IV squamous cell carcinoma of left lung ([place]) 09/25/2016    Patient Active Problem List   Diagnosis Date Noted  . COPD with acute exacerbation ([place])   . Hypothermia 08/22/2017  . Hypoglycemia without diagnosis of diabetes mellitus 08/22/2017  . SIRS (systemic inflammatory response syndrome) ([place]) 08/22/2017  . Hypoglycemia 08/22/2017  . Acute encephalopathy 08/22/2017  . Somnolence   . DNR (do not resuscitate)   . History of pulmonary embolus (PE) 05/27/2017  . Goals of care, counseling/discussion   . Palliative care by specialist   . Acute respiratory failure with hypoxia ([place]) 05/14/2017  . CAD (coronary artery disease) 05/14/2017  . PAF (paroxysmal atrial fibrillation) ([place]) 05/14/2017  . CKD (chronic kidney disease) stage 3, GFR 30-59 ml/min ([facility]) 05/14/2017  . Chronic obstructive pulmonary disease ([place]) 09/27/2016  . Stage IV squamous cell carcinoma of left lung ([place]) 09/25/2016    Past Surgical History:  Procedure Laterality  Date  . ABDOMINAL HYSTERECTOMY    . CORONARY ARTERY BYPASS GRAFT    . OOPHORECTOMY    . TONSILLECTOMY       OB History   None      Home Medications    Prior to Admission medications   Medication Sig Start Date End Date Taking? Authorizing Provider  acetaminophen (TYLENOL) 650 MG CR tablet Take 650 mg by mouth every 6 (six) hours.     [provider]  albuterol (PROVENTIL HFA;VENTOLIN HFA) 108 (90 Base) MCG/ACT inhaler Inhale 2 puffs into the lungs every 6 (six) hours as needed for wheezing or shortness of breath. 05/20/17   Rai, Vernelle Emerald, MD  apixaban (ELIQUIS) 2.5 MG TABS tablet Take 1 tablet (2.5 mg total) by mouth 2 (two) times daily. 08/26/17   Elgergawy, Silver Huguenin, MD  atorvastatin (LIPITOR) 40 MG tablet Take 1 tablet (40 mg total) by mouth daily. 10/04/16 10/04/17  Lelon Perla, MD  benzonatate (TESSALON) 100 MG capsule Take 1 capsule (100 mg total) by mouth 3 (three) times daily as needed for cough. 05/20/17   Rai, Vernelle Emerald, MD  budesonide-formoterol (SYMBICORT) 160-4.5 MCG/ACT inhaler Inhale 2 puffs into the lungs 2 (two) times daily.    [provider]  clonazePAM (KLONOPIN) 0.5 MG tablet Take 1.5 tablets (0.75 mg total) by mouth at bedtime. 08/26/17   Elgergawy, Silver Huguenin, MD  clonazePAM (KLONOPIN) 0.5 MG tablet Take 1 tablet (0.5 mg total) by mouth daily  as needed for anxiety. 08/26/17 08/26/18  Elgergawy, Silver Huguenin, MD  Coenzyme Q-10 200 MG CAPS Take 200 mg by mouth 2 (two) times daily.     [provider]  feeding supplement, ENSURE ENLIVE, (ENSURE ENLIVE) LIQD Take 237 mLs by mouth 3 (three) times daily between meals. 08/26/17   Elgergawy, Silver Huguenin, MD  FeFum-FePo-FA-B Cmp-C-Zn-Mn-Cu (SE-TAN PLUS PO) Take 1 capsule by mouth daily.    [provider]  furosemide (LASIX) 40 MG tablet Take 0.5 tablets (20 mg total) by mouth every other day. 08/26/17   Elgergawy, Silver Huguenin, MD  gabapentin (NEURONTIN) 100 MG capsule Take 100 mg by mouth 3 (three)  times daily.    [provider]  guaifenesin (ROBITUSSIN) 100 MG/5ML syrup Take 100 mg by mouth 4 (four) times daily as needed for cough.    [provider]  hydroxychloroquine (PLAQUENIL) 200 MG tablet Take 200 mg by mouth daily.    [provider]  levothyroxine (SYNTHROID, LEVOTHROID) 112 MCG tablet Take 1 tablet (112 mcg total) by mouth daily before breakfast. 08/27/17   Elgergawy, Silver Huguenin, MD  loperamide (IMODIUM A-D) 2 MG tablet Take 2 mg by mouth 4 (four) times daily as needed for diarrhea or loose stools.    [provider]  loratadine (CLARITIN) 10 MG tablet Take 10 mg by mouth daily.    [provider]  memantine (NAMENDA) 10 MG tablet Take 10 mg by mouth 2 (two) times daily.    [provider]  metoprolol tartrate (LOPRESSOR) 25 MG tablet Take 25 mg by mouth daily.     [provider]  mirtazapine (REMERON) 15 MG tablet Take 1 tablet (15 mg total) by mouth at bedtime. Patient taking differently: Take 7.5 mg by mouth at bedtime.  08/26/17   Elgergawy, Silver Huguenin, MD  pantoprazole (PROTONIX) 40 MG tablet Take 40 mg by mouth daily.    [provider]  polyethylene glycol (MIRALAX / GLYCOLAX) packet Take 17 g by mouth daily as needed for mild constipation.    [provider]  potassium chloride (KLOR-CON) 20 MEQ packet Take 20 mEq by mouth daily.    [provider]  predniSONE (DELTASONE) 5 MG tablet Take 5 mg by mouth daily.    [provider]  predniSONE (STERAPRED UNI-PAK 21 TAB) 5 MG (21) TBPK tablet Use per package instruction , 21 tablets, 5 mg prednisone taper 08/26/17   Elgergawy, Silver Huguenin, MD  rivastigmine (EXELON) 4.6 mg/24hr Place 4.6 mg onto the skin daily.    [provider]  sertraline (ZOLOFT) 100 MG tablet Take 100 mg by mouth daily.    [provider]  sodium chloride 1 g tablet Take 1 g by mouth 2 (two) times daily with a meal.    [provider]    tiotropium (SPIRIVA) 18 MCG inhalation capsule Place 18 mcg into inhaler and inhale daily.    [provider]  traMADol (ULTRAM) 50 MG tablet Take 50 mg by mouth 2 (two) times daily.    [provider]    Family History Family History  Problem Relation Age of Onset  . Parkinsonism Father     Social History Social History   Tobacco Use  . Smoking status: Former Smoker    Packs/day: 1.00    Years: 40.00    Pack years: 40.00    Types: Cigarettes    Last attempt to quit: 03/22/1996    Years since quitting: 21.7  . Smokeless tobacco:  Never Used  Substance Use Topics  . Alcohol use: No  . Drug use: No     Allergies   Sulfa antibiotics and Contrast media [iodinated diagnostic agents]   Review of Systems Review of Systems  Unable to perform ROS: Dementia     Physical Exam Updated Vital Signs BP 125/79 (BP Location: Right Arm)   Pulse (!) 116   Resp 20   SpO2 100%   Physical Exam  Nursing note and vitals reviewed.  83 year old female, resting comfortably and in no acute distress. Vital signs are significant for rapid heart rate. Oxygen saturation is 100%, which is normal. Head is normocephalic and atraumatic. PERRLA, EOMI. Oropharynx is clear. Neck is immobilized with a towel roll and is nontender without adenopathy or JVD. Back is nontender and there is no CVA tenderness. Lungs are clear without rales, wheezes, or rhonchi. Chest is nontender. Heart has regular rate and rhythm without murmur. Abdomen is soft, flat, nontender without masses or hepatosplenomegaly and peristalsis is normoactive. Extremities have no cyanosis or edema, full range of motion is present. Skin is warm and dry without rash. Neurologic: Awake and alert and oriented to person but not place or time, cranial nerves are intact, there are no motor or sensory deficits.  ED Treatments / Results  Labs (all labs ordered are listed, but only abnormal results are displayed) Labs  Reviewed  CBG MONITORING, ED - Abnormal; Notable for the following components:      Result Value   Glucose-Capillary 103 (*)    All other components within normal limits   Radiology Ct Head Wo Contrast  Result Date: 12/14/2017 CLINICAL DATA:  83 year old female with maxillofacial trauma. EXAM: CT HEAD WITHOUT CONTRAST CT CERVICAL SPINE WITHOUT CONTRAST TECHNIQUE: Multidetector CT imaging of the head and cervical spine was performed following the standard protocol without intravenous contrast. Multiplanar CT image reconstructions of the cervical spine were also generated. COMPARISON:  Head CT dated 08/22/2017 FINDINGS: CT HEAD FINDINGS Brain: Moderate age-related atrophy and chronic microvascular ischemic changes. There is no acute intracranial hemorrhage. No mass effect or midline shift. No extra-axial fluid collection. Vascular: No hyperdense vessel or unexpected calcification. Skull: Normal. Negative for fracture or focal lesion. Sinuses/Orbits: No acute finding. Other: None CT CERVICAL SPINE FINDINGS Limited evaluation of the cervical spine due to motion artifact. Alignment: No acute subluxation. Grade 1 C4-C5 and C5-C6 anterolisthesis. Skull base and vertebrae: No acute fracture. Soft tissues and spinal canal: No prevertebral fluid or swelling. No visible canal hematoma. Disc levels: Multilevel degenerative changes. Multilevel facet hypertrophy. Upper chest: Partially visualized moderate right pleural effusion and probable partially visualized left pleural effusion. There is emphysema. Other: None IMPRESSION: 1. No acute intracranial hemorrhage. 2. Moderate age-related atrophy and chronic microvascular ischemic changes. 3. No acute/traumatic cervical spine pathology. 4. Multilevel degenerative changes of the cervical spine with grade 1 C4-C5 and C5-C6 anterolisthesis. 5. Bilateral pleural effusions, incompletely visualized. Electronically Signed   By: Anner Crete M.D.   On: 12/14/2017 06:50   Ct  Cervical Spine Wo Contrast  Result Date: 12/14/2017 CLINICAL DATA:  83 year old female with maxillofacial trauma. EXAM: CT HEAD WITHOUT CONTRAST CT CERVICAL SPINE WITHOUT CONTRAST TECHNIQUE: Multidetector CT imaging of the head and cervical spine was performed following the standard protocol without intravenous contrast. Multiplanar CT image reconstructions of the cervical spine were also generated. COMPARISON:  Head CT dated 08/22/2017 FINDINGS: CT HEAD FINDINGS Brain: Moderate age-related atrophy and chronic microvascular ischemic changes. There is no acute intracranial hemorrhage.  No mass effect or midline shift. No extra-axial fluid collection. Vascular: No hyperdense vessel or unexpected calcification. Skull: Normal. Negative for fracture or focal lesion. Sinuses/Orbits: No acute finding. Other: None CT CERVICAL SPINE FINDINGS Limited evaluation of the cervical spine due to motion artifact. Alignment: No acute subluxation. Grade 1 C4-C5 and C5-C6 anterolisthesis. Skull base and vertebrae: No acute fracture. Soft tissues and spinal canal: No prevertebral fluid or swelling. No visible canal hematoma. Disc levels: Multilevel degenerative changes. Multilevel facet hypertrophy. Upper chest: Partially visualized moderate right pleural effusion and probable partially visualized left pleural effusion. There is emphysema. Other: None IMPRESSION: 1. No acute intracranial hemorrhage. 2. Moderate age-related atrophy and chronic microvascular ischemic changes. 3. No acute/traumatic cervical spine pathology. 4. Multilevel degenerative changes of the cervical spine with grade 1 C4-C5 and C5-C6 anterolisthesis. 5. Bilateral pleural effusions, incompletely visualized. Electronically Signed   By: Anner Crete M.D.   On: 12/14/2017 06:50    Procedures Procedures  Medications Ordered in ED Medications - No data to display   Initial Impression / Assessment and Plan / ED Course  I have reviewed the triage vital  signs and the nursing notes.  Pertinent labs & imaging results that were available during my care of the patient were reviewed by me and considered in my medical decision making (see chart for details).  Unwitnessed fall in a patient with a who is anticoagulated.  She will be sent for CT of head and cervical spine.  Old records are reviewed, and she has no relevant past visits.  CT shows no intracranial injury, no C-spine injury.  She is discharged to go back to her skilled nursing facility.  She does not have significant number of falls, but risk/benefit of anticoagulation needs to be considered if she has more falls.  Initial tachycardia noted, will check heart rate prior to discharge.  She continues to have tachycardia, but clinically appears well.  She is in absolutely no distress and is nontoxic in appearance.  No tenderness on exam.  In spite of tachycardia, I do feel discharge is appropriate at this point.  Final Clinical Impressions(s) / ED Diagnoses   Final diagnoses:  Fall at nursing home, initial encounter    ED Discharge Orders    None       Delora Fuel, MD 57/32/20 [id number]    Delora Fuel, MD 70/62/37 [phone]

## 2017-12-21 ENCOUNTER — Emergency Department (HOSPITAL_COMMUNITY)

## 2017-12-21 ENCOUNTER — Emergency Department (HOSPITAL_COMMUNITY)
Admission: EM | Admit: 2017-12-21 | Discharge: 2017-12-21 | Disposition: A | Discharge status: Home / Self Care | Attending: Emergency Medicine | Admitting: Emergency Medicine

## 2017-12-21 ENCOUNTER — Other Ambulatory Visit: Payer: Self-pay

## 2017-12-21 ENCOUNTER — Encounter (HOSPITAL_COMMUNITY): Payer: Self-pay | Admitting: Emergency Medicine

## 2017-12-21 DIAGNOSIS — Z85118 Personal history of other malignant neoplasm of bronchus and lung: Secondary | ICD-10-CM | POA: Insufficient documentation

## 2017-12-21 DIAGNOSIS — N289 Disorder of kidney and ureter, unspecified: Secondary | ICD-10-CM | POA: Insufficient documentation

## 2017-12-21 DIAGNOSIS — F0390 Unspecified dementia without behavioral disturbance: Secondary | ICD-10-CM | POA: Insufficient documentation

## 2017-12-21 DIAGNOSIS — I252 Old myocardial infarction: Secondary | ICD-10-CM | POA: Insufficient documentation

## 2017-12-21 DIAGNOSIS — I2510 Atherosclerotic heart disease of native coronary artery without angina pectoris: Secondary | ICD-10-CM | POA: Insufficient documentation

## 2017-12-21 DIAGNOSIS — Z043 Encounter for examination and observation following other accident: Secondary | ICD-10-CM | POA: Diagnosis present

## 2017-12-21 DIAGNOSIS — J189 Pneumonia, unspecified organism: Secondary | ICD-10-CM | POA: Insufficient documentation

## 2017-12-21 DIAGNOSIS — W1830XA Fall on same level, unspecified, initial encounter: Secondary | ICD-10-CM | POA: Diagnosis not present

## 2017-12-21 DIAGNOSIS — J449 Chronic obstructive pulmonary disease, unspecified: Secondary | ICD-10-CM | POA: Insufficient documentation

## 2017-12-21 DIAGNOSIS — W19XXXA Unspecified fall, initial encounter: Secondary | ICD-10-CM

## 2017-12-21 LAB — I-STAT CHEM 8, ED
BUN: 34 mg/dL — AB (ref 8–23)
CALCIUM ION: 1.14 mmol/L — AB (ref 1.15–1.40)
Chloride: 106 mmol/L (ref 98–111)
Creatinine, Ser: 1.8 mg/dL — ABNORMAL HIGH (ref 0.44–1.00)
Glucose, Bld: 89 mg/dL (ref 70–99)
HEMATOCRIT: 39 % (ref 36.0–46.0)
HEMOGLOBIN: 13.3 g/dL (ref 12.0–15.0)
Potassium: 4.6 mmol/L (ref 3.5–5.1)
Sodium: 143 mmol/L (ref 135–145)
TCO2: 30 mmol/L (ref 22–32)

## 2017-12-21 LAB — URINALYSIS, ROUTINE W REFLEX MICROSCOPIC
BILIRUBIN URINE: NEGATIVE
Glucose, UA: NEGATIVE mg/dL
Hgb urine dipstick: NEGATIVE
KETONES UR: NEGATIVE mg/dL
LEUKOCYTES UA: NEGATIVE
NITRITE: NEGATIVE
PH: 5 (ref 5.0–8.0)
PROTEIN: NEGATIVE mg/dL
Specific Gravity, Urine: 1.017 (ref 1.005–1.030)

## 2017-12-21 MED ORDER — LEVOFLOXACIN 500 MG PO TABS: 500.0000 mg | ORAL_TABLET | Freq: Every day | ORAL | 0 refills | Status: AC

## 2017-12-21 MED ORDER — LACTATED RINGERS IV BOLUS
1000.0000 mL | Freq: Once | INTRAVENOUS | Status: AC
  Administered 2017-12-21: 1000 mL via INTRAVENOUS

## 2017-12-21 MED ORDER — LEVOFLOXACIN 500 MG PO TABS
500.0000 mg | ORAL_TABLET | Freq: Once | ORAL | Status: AC
  Administered 2017-12-21: 500 mg via ORAL
  Filled 2017-12-21: qty 1

## 2017-12-21 NOTE — ED Triage Notes (Signed)
Pt is from [place]. Pt was found on the floor covered with a blanket with a pillow under her head. Pt states she fell, this was unwitnessed,  Pt states she hit the right side of her head but has no pain. Pt is on eliquis.

## 2017-12-21 NOTE — Discharge Instructions (Signed)
Take levaquin daily for a week.   Stay hydrated. Your kidney function is slightly abnormal likely from dehydration   Recheck kidney function in a week with your doctor  Return to ER if you have another fall, trouble breathing, fever, vomiting.

## 2017-12-21 NOTE — ED Provider Notes (Signed)
  Physical Exam  BP 111/77 (BP Location: Left Arm)   Pulse 86   Temp 98.6 F (37 C) (Oral)   Resp 16   SpO2 100%   Physical Exam  ED Course/Procedures     Procedures  MDM  Care assumed at 7 am. Patient is from assisted living and had unwitnessed fall. CXR showed possible pneumonia vs atelectasis. CT head normal. Sign out pending UA and likely discharge.   7:36 AM UA normal. Will give levaquin for possible pneumonia. o2 stable on chronic oxygen. Stable to discharge back to facility.      Drenda Freeze, MD 12/21/17 [phone]

## 2017-12-21 NOTE — ED Notes (Signed)
Patient verbalizes understanding of discharge instructions. Opportunity for questioning and answers were provided. Armband removed by staff, pt discharged from ED via wheelchair with family.

## 2017-12-21 NOTE — ED Provider Notes (Signed)
Emergency Department Provider Note   I have reviewed the triage vital signs and the nursing notes.   HISTORY  Chief Complaint Fall   HPI Kristin Peterson is a 83 y.o. female who is brought in by EMS secondary to an unwitnessed fall.  Sounds like when patient was found she was on the floor sleeping on a pillow states she had fallen but did not really have any pain.  On my exam patient is pleasantly demented not able to offer any history. No other associated or modifying symptoms.   Level 5 caveat secondary to dementia. Past Medical History:  Diagnosis Date  . Allergy   . Anxiety   . Arthritis   . CAD (coronary artery disease)   . Cataract   . COPD (chronic obstructive pulmonary disease) ([place])   . Dementia ([place])   . Depression   . Myocardial infarction ([place])   . Stage IV squamous cell carcinoma of left lung ([place]) 09/25/2016    Patient Active Problem List   Diagnosis Date Noted  . COPD with acute exacerbation ([place])   . Hypothermia 08/22/2017  . Hypoglycemia without diagnosis of diabetes mellitus 08/22/2017  . SIRS (systemic inflammatory response syndrome) ([place]) 08/22/2017  . Hypoglycemia 08/22/2017  . Acute encephalopathy 08/22/2017  . Somnolence   . DNR (do not resuscitate)   . History of pulmonary embolus (PE) 05/27/2017  . Goals of care, counseling/discussion   . Palliative care by specialist   . Acute respiratory failure with hypoxia ([place]) 05/14/2017  . CAD (coronary artery disease) 05/14/2017  . PAF (paroxysmal atrial fibrillation) ([place]) 05/14/2017  . CKD (chronic kidney disease) stage 3, GFR 30-59 ml/min ([facility]) 05/14/2017  . Chronic obstructive pulmonary disease ([place]) 09/27/2016  . Stage IV squamous cell carcinoma of left lung ([place]) 09/25/2016    Past Surgical History:  Procedure Laterality Date  . ABDOMINAL HYSTERECTOMY    . CORONARY ARTERY BYPASS GRAFT    . OOPHORECTOMY    . TONSILLECTOMY      Current Outpatient Rx  . Order #: [id number] Class:  Historical Med  . Order #: [id number] Class: Print  . Order #: [id number] Class: No Print  . Order #: [id number] Class: Print  . Order #: [id number] Class: Historical Med  . Order #: [id number] Class: Print  . Order #: [id number] Class: Print  . Order #: [id number] Class: No Print  . Order #: [id number] Class: No Print  . Order #: [id number] Class: Historical Med  . Order #: [id number] Class: Historical Med  . Order #: [id number] Class: No Print  . Order #: [id number] Class: Historical Med  . Order #: [id number] Class: Historical Med  . Order #: [id number] Class: Historical Med  . Order #: [id number] Class: No Print  . Order #: [id number] Class: Historical Med  . Order #: [id number] Class: Historical Med  . Order #: [id number] Class: Historical Med  . Order #: [id number] Class: Historical Med  . Order #: [id number] Class: Historical Med  . Order #: [id number] Class: Historical Med  . Order #: [id number] Class: Historical Med  . Order #: [id number] Class: Historical Med  . Order #: [id number] Class: Print  . Order #: [id number] Class: No Print    Allergies Sulfa antibiotics and Contrast media [iodinated diagnostic agents]  Family History  Problem Relation Age of Onset  . Parkinsonism Father     Social History Social History   Tobacco Use  . Smoking status: Former Smoker    Packs/day: 1.00    Years: 40.00    Pack years: 40.00    Types: Cigarettes    Last attempt to quit: 03/22/1996  Years since quitting: 21.7  . Smokeless tobacco: Never Used  Substance Use Topics  . Alcohol use: No  . Drug use: No    Review of Systems  Level 5 caveat secondary to dementia. ____________________________________________   PHYSICAL EXAM:  VITAL SIGNS: Vitals:   12/21/17 0300 12/21/17 0541  BP: 108/62 111/77  Pulse: (!) 102 86  Resp: 14 16  Temp: 98 F (36.7 C) 98.6 F (37 C)  TempSrc: Oral Oral  SpO2: 95% 100%    Constitutional: Alert and dis-oriented. Well appearing and in no acute distress. Eyes: Conjunctivae  are normal. PERRL. EOMI. Head: Atraumatic. Nose: No congestion/rhinnorhea. Mouth/Throat: Mucous membranes are moist.  Oropharynx non-erythematous. Neck: No stridor.  No meningeal signs.   Cardiovascular: Normal rate, regular rhythm. Good peripheral circulation. Grossly normal heart sounds.   Respiratory: Normal respiratory effort.  No retractions. Lungs CTAB. Gastrointestinal: Soft and nontender. No distention.  Musculoskeletal: No lower extremity tenderness nor edema. No gross deformities of extremities. Neurologic:  Normal speech and language. No gross focal neurologic deficits are appreciated.  Skin:  Skin is warm, dry and intact. Ecchymosis to right leg.   ____________________________________________   LABS (all labs ordered are listed, but only abnormal results are displayed)  Labs Reviewed  I-STAT CHEM 8, ED - Abnormal; Notable for the following components:      Result Value   BUN 34 (*)    Creatinine, Ser 1.80 (*)    Calcium, Ion 1.14 (*)    All other components within normal limits  URINALYSIS, ROUTINE W REFLEX MICROSCOPIC   ____________________________________________  EKG   EKG Interpretation  Date/Time:    Ventricular Rate:    PR Interval:    QRS Duration:   QT Interval:    QTC Calculation:   R Axis:     Text Interpretation:         ____________________________________________  RADIOLOGY  Dg Chest 2 View  Result Date: 12/21/2017 CLINICAL DATA:  Post fall. EXAM: CHEST - 2 VIEW COMPARISON:  Radiograph 09/16/2017, chest CT 07/04/2017 FINDINGS: Left chest port with tip in the mid SVC. Post median sternotomy. Bibasilar airspace opacities and pleural effusions, right greater than left. There is vascular congestion. No pneumothorax. Remote right rib fractures. Chronic hyperinflation. No acute osseous abnormalities are seen. IMPRESSION: 1. Bibasilar airspace opacities and pleural effusions, right greater than left. Pneumonia versus passive atelectasis. 2.  Vascular congestion. Electronically Signed   By: Keith Rake M.D.   On: 12/21/2017 05:16   Dg Tibia/fibula Right  Result Date: 12/21/2017 CLINICAL DATA:  Fall, right leg bruising. EXAM: RIGHT TIBIA AND FIBULA - 2 VIEW COMPARISON:  None. FINDINGS: Cortical margins of the tibia and fibula are intact. No fracture. Knee and ankle alignment are maintained. Diffuse soft tissue edema of the lower extremity. No soft tissue air or radiopaque foreign body. IMPRESSION: Soft tissue edema. No acute osseous abnormality. Electronically Signed   By: Keith Rake M.D.   On: 12/21/2017 05:14   Ct Head Wo Contrast  Result Date: 12/21/2017 CLINICAL DATA:  Head trauma, minor, GCS>=13, low clinical risk, initial exam EXAM: CT HEAD WITHOUT CONTRAST TECHNIQUE: Contiguous axial images were obtained from the base of the skull through the vertex without intravenous contrast. COMPARISON:  Head CT 1 week ago 12/14/2017, also 08/22/2017 FINDINGS: Brain: No acute hemorrhage or extra-axial collection. Unchanged atrophy and chronic small vessel ischemia. Extra-axial CSF prominence likely related to atrophy rather than hygromas, regardless unchanged. 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 No evidence  of acute ischemia, mass effect, or midline shift. Vascular: Atherosclerosis of skullbase vasculature without hyperdense vessel or abnormal calcification. Skull: No fracture or focal lesion. Sinuses/Orbits: Paranasal sinuses and mastoid air cells are clear. The visualized orbits are unremarkable. Bilateral cataract resection. Other: None. IMPRESSION: 1.  No acute intracranial abnormality.  No skull fracture. 2. Stable atrophy and chronic ischemia. Electronically Signed   By: Keith Rake M.D.   On: 12/21/2017 05:01    ____________________________________________   PROCEDURES  Procedure(s) performed:   Procedures   ____________________________________________   INITIAL IMPRESSION / ASSESSMENT  AND PLAN / ED COURSE  No obvious traumatic injuries. Mild AKI and questionable pneumonia. No fever, leukocytosis or new hypoxia to suggest infection however has had a dry cough so will give trial of antibiotics and PCP follow up to evaluate kidney function and response to antibiotics.   Fluids given. Pending urinalysis at time of transfer of care. Expect likely discharge.    Pertinent labs & imaging results that were available during my care of the patient were reviewed by me and considered in my medical decision making (see chart for details).  ____________________________________________  FINAL CLINICAL IMPRESSION(S) / ED DIAGNOSES  Final diagnoses:  None     MEDICATIONS GIVEN DURING THIS VISIT:  Medications  lactated ringers bolus 1,000 mL (1,000 mLs Intravenous New Bag/Given 12/21/17 0610)     NEW OUTPATIENT MEDICATIONS STARTED DURING THIS VISIT:  New Prescriptions   No medications on file    Note:  This note was prepared with assistance of Dragon voice recognition software. Occasional wrong-word or sound-a-like substitutions may have occurred due to the inherent limitations of voice recognition software.   Merrily Pew, MD 12/21/17 2142

## 2018-01-02 ENCOUNTER — Telehealth: Payer: Self-pay | Admitting: Medical Oncology

## 2018-01-02 NOTE — Telephone Encounter (Signed)
Son called and cancelled all appts. Pt is under hospice by Doctor making house calls.

## 2018-01-06 ENCOUNTER — Other Ambulatory Visit: Payer: Medicare Other

## 2018-01-09 ENCOUNTER — Ambulatory Visit: Payer: Medicare Other | Admitting: Internal Medicine

## 2018-02-08 DEATH — deceased

## 2018-10-02 IMAGING — NM NM PULMONARY PERF PARTICULATE
8 series · 8 of 8 positions shown · non-contrast
Comparison: Chest CT 05/13/2017.

CLINICAL DATA: 82-year-old female with chronic respiratory disease
on home oxygen. Stage IV lung cancer. Progressive shortness of
breath for 2 days with nonproductive cough.

EXAM:
NUCLEAR MEDICINE PERFUSION SCAN
TECHNIQUE: Perfusion images were obtained in multiple projections after
intravenous injection of radiopharmaceutical.
RADIOPHARMACEUTICALS:  4.0 mCi McVVm MAA-IV only (no ventilation
agent)

[Series 1: ant/post perf · 4.14mm/px · 1 of 1 slices shown (1 of 2)]
[im 1/1]
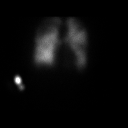

[Series 1: ant/post perf · 4.14mm/px · 1 of 1 slices shown (2 of 2)]
[im 1/1]
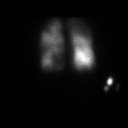

[Series 2: lpo/rao perf · 4.14mm/px · 1 of 1 slices shown (1 of 2)]
[im 1/1]
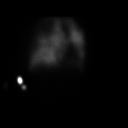

[Series 2: lpo/rao perf · 4.14mm/px · 1 of 1 slices shown (2 of 2)]
[im 1/1]
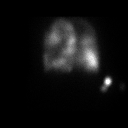

[Series 3: lao/rpo perf · 4.14mm/px · 1 of 1 slices shown (1 of 2)]
[im 1/1]
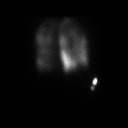

[Series 3: lao/rpo perf · 4.14mm/px · 1 of 1 slices shown (2 of 2)]
[im 1/1]
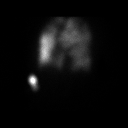

[Series 4: lt lat/rt lat perf · 4.14mm/px · 1 of 1 slices shown (1 of 2)]
[im 1/1]
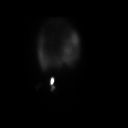

[Series 4: lt lat/rt lat perf · 4.14mm/px · 1 of 1 slices shown (2 of 2)]
[im 1/1]
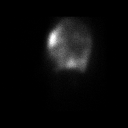

[8 of 8 positions shown; findings below may reference images not displayed]

FINDINGS: Ventilation: Not performed.

Perfusion: Large lung volumes. Gradient like decreased perfusion
radiotracer activity in the upper lobes, more so the right. Focal
increased radiotracer activity in the right lower lobe.

Additionally, there are peripheral wedge-shaped areas of decreased
perfusion activity in the left lateral and upper lung, best
demonstrated on the posterior image.
IMPRESSION: Perfusion-only imaging which is complicated by chronic lung disease,
but peripheral wedge-shaped perfusion defects in the left lung are
suspicious for acute pulmonary embolus.

## 2019-05-11 IMAGING — CT CT HEAD W/O CM
4 series · 15 of 47 positions shown, 17 images · non-contrast
Comparison: Head CT 1 week ago 12/14/2017, also 08/22/2017

CLINICAL DATA: Head trauma, minor, GCS>=13, low clinical risk,
initial exam

EXAM:
CT HEAD WITHOUT CONTRAST
TECHNIQUE: Contiguous axial images were obtained from the base of the skull
through the vertex without intravenous contrast.

[Series 3: head wo · axial · 0.45mm/px · z∈[-176,-56]mm · 7 of 34 slices shown, 9 images]
[im 5/34  brain]
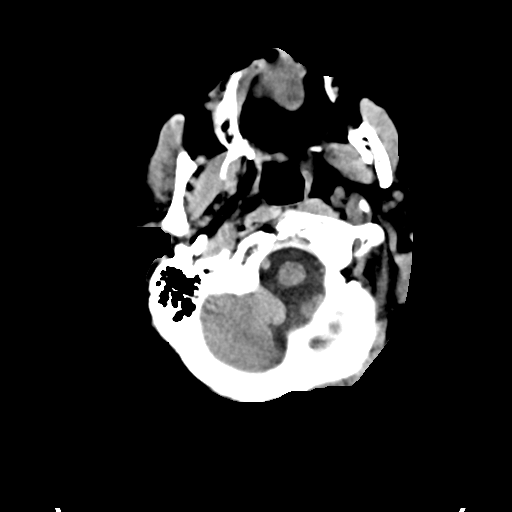
[im 5/34  bone]
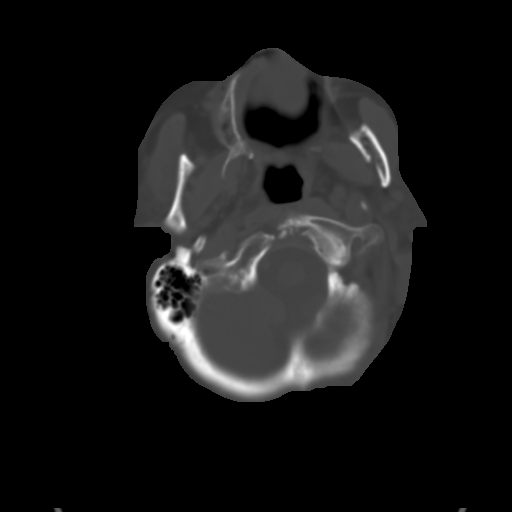
[im 9/34  brain]
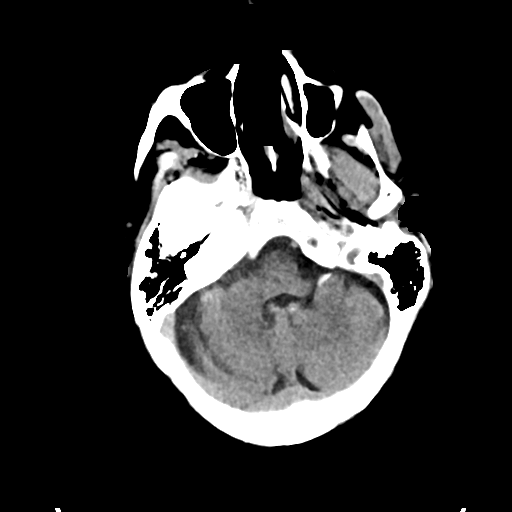
[im 13/34  brain]
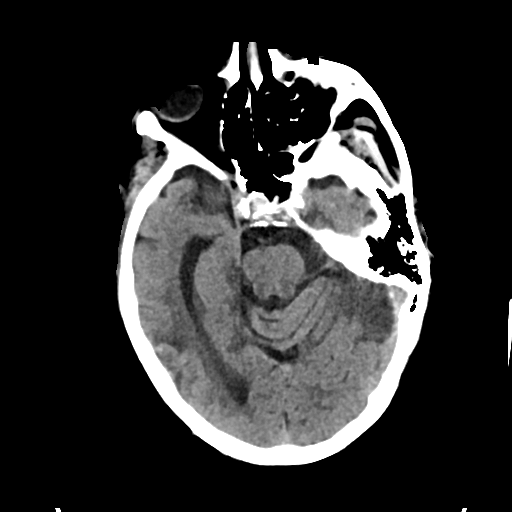
[im 17/34  brain]
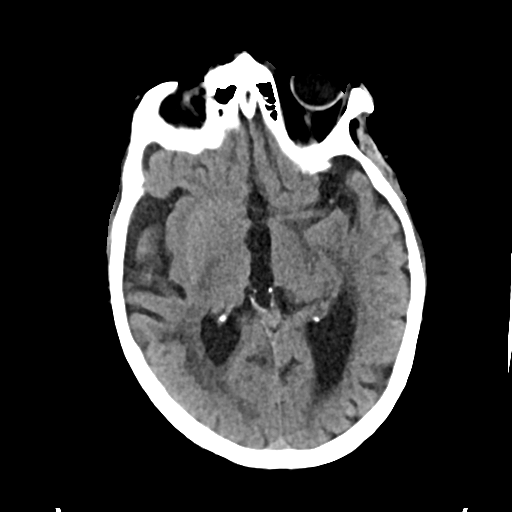
[im 21/34  brain]
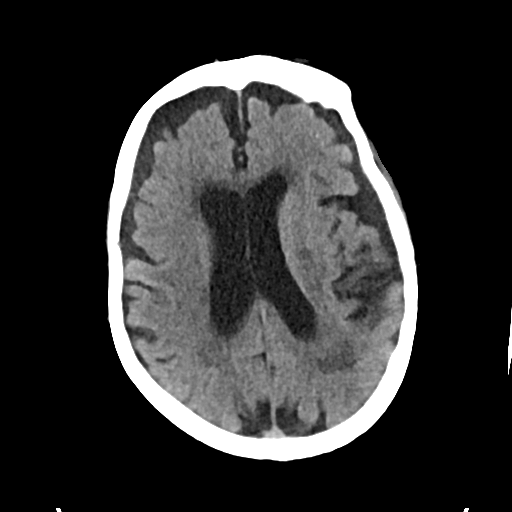
[im 21/34  bone]
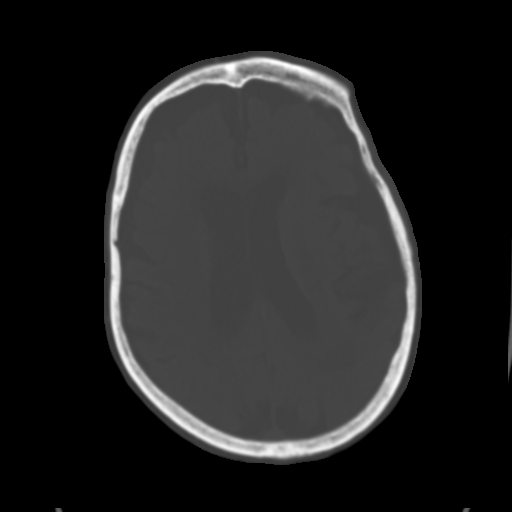
[im 25/34  brain]
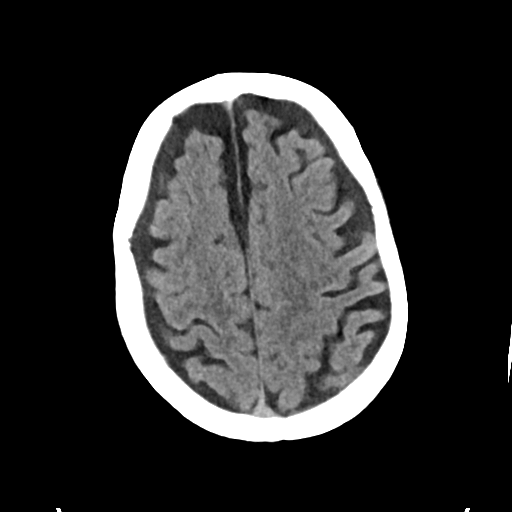
[im 29/34  brain]
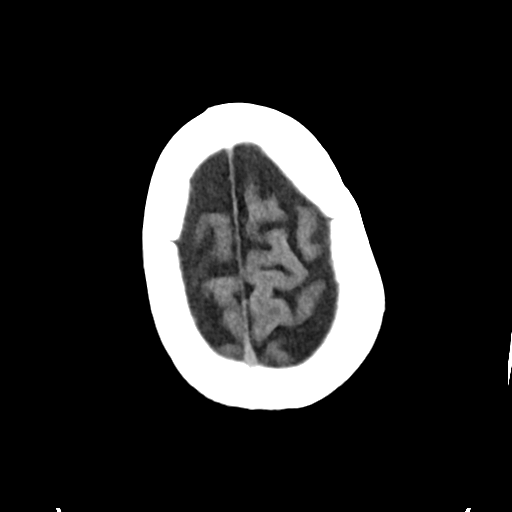

[Series 4: head bone · axial · 0.45mm/px · z∈[-180,-164]mm · 2 of 85 slices shown]
[im 9/85  bone]
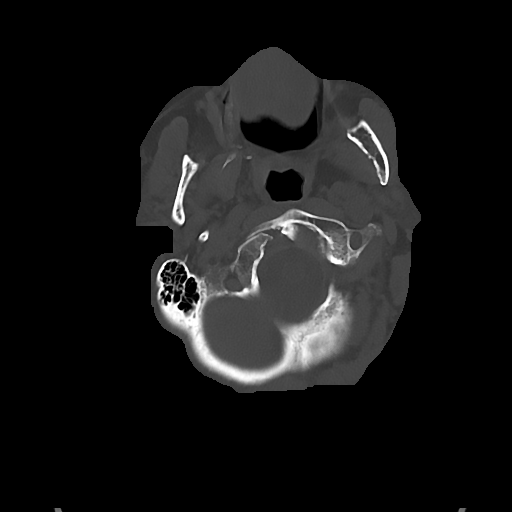
[im 17/85  bone]
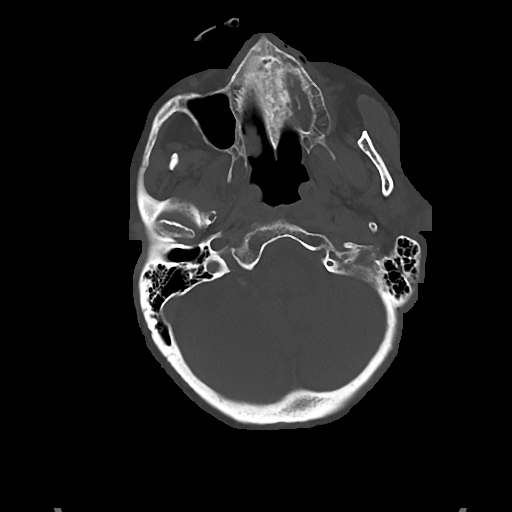

[Series 5: cor soft · coronal · 0.33mm/px · 3 of 78 slices shown]
[im 26/78  brain]
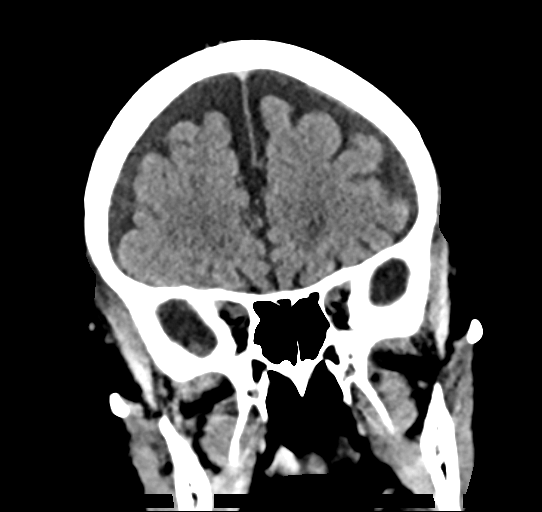
[im 35/78  brain]
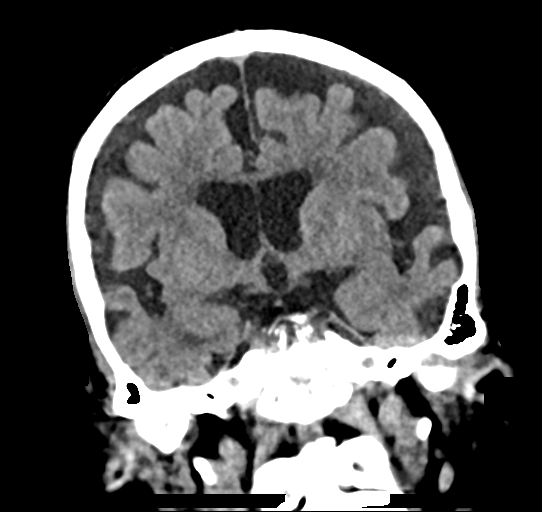
[im 43/78  brain]
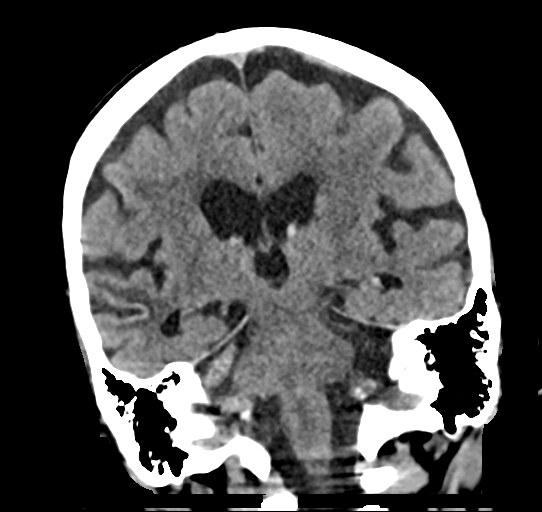

[Series 6: sag soft · sagittal · 0.33mm/px · 3 of 58 slices shown]
[im 20/58  brain]
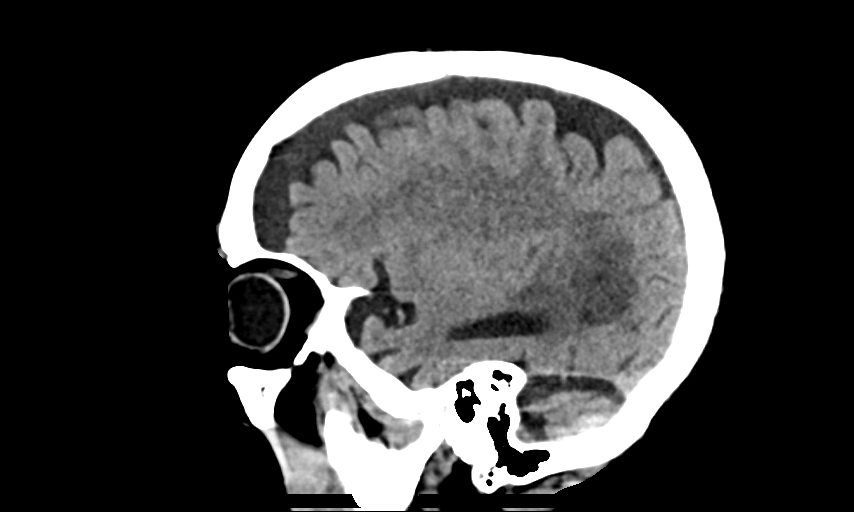
[im 29/58  brain]
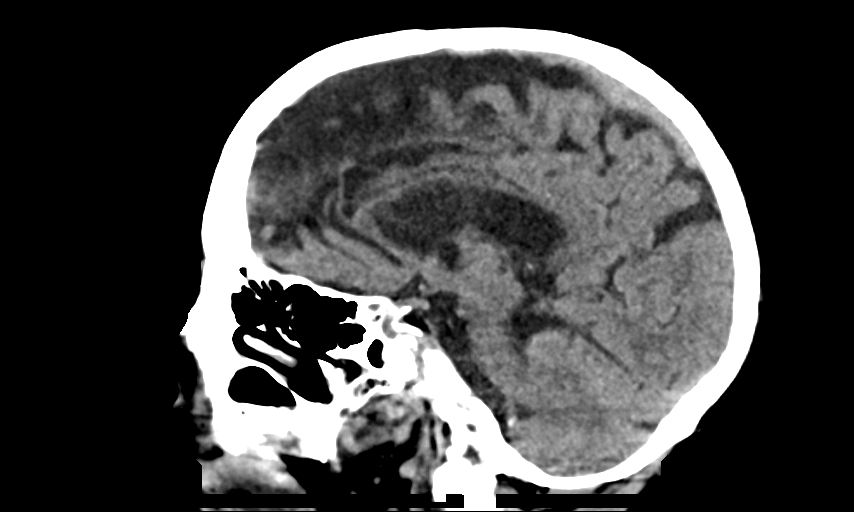
[im 39/58  brain]
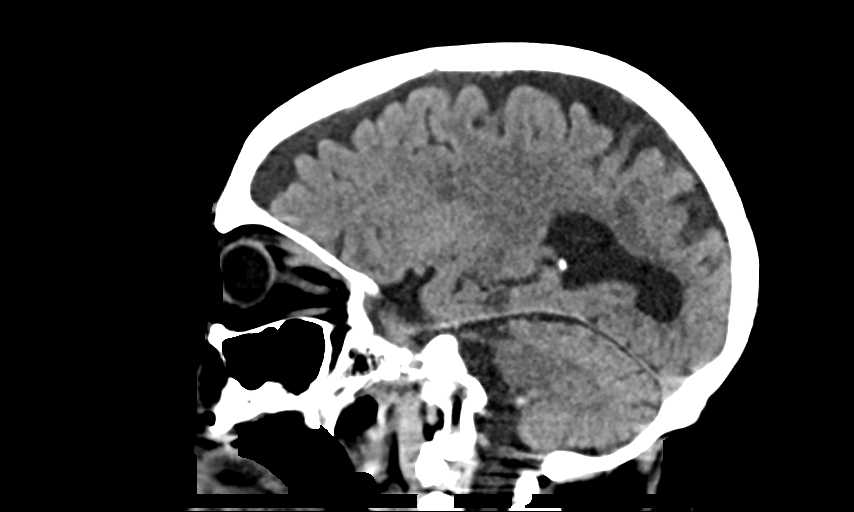

[15 of 47 positions shown; findings below may reference images not displayed]

FINDINGS: Brain: No acute hemorrhage or extra-axial collection. Unchanged
atrophy and chronic small vessel ischemia. Extra-axial CSF
prominence likely related to atrophy rather than hygromas,
regardless unchanged. Remote infarct involving the right temporal
lobe again seen. No evidence of acute ischemia, mass effect, or
midline shift.

Vascular: Atherosclerosis of skullbase vasculature without
hyperdense vessel or abnormal calcification.

Skull: No fracture or focal lesion.

Sinuses/Orbits: Paranasal sinuses and mastoid air cells are clear.
The visualized orbits are unremarkable. Bilateral cataract
resection.

Other: None.
IMPRESSION: 1.  No acute intracranial abnormality.  No skull fracture.
2. Stable atrophy and chronic ischemia.

## 2019-05-11 IMAGING — DX DG CHEST 2V
2 series · 2 of 2 positions shown · non-contrast
Comparison: Radiograph 09/16/2017, chest CT 07/04/2017

CLINICAL DATA: Post fall.

EXAM:
CHEST - 2 VIEW

[chest lat]
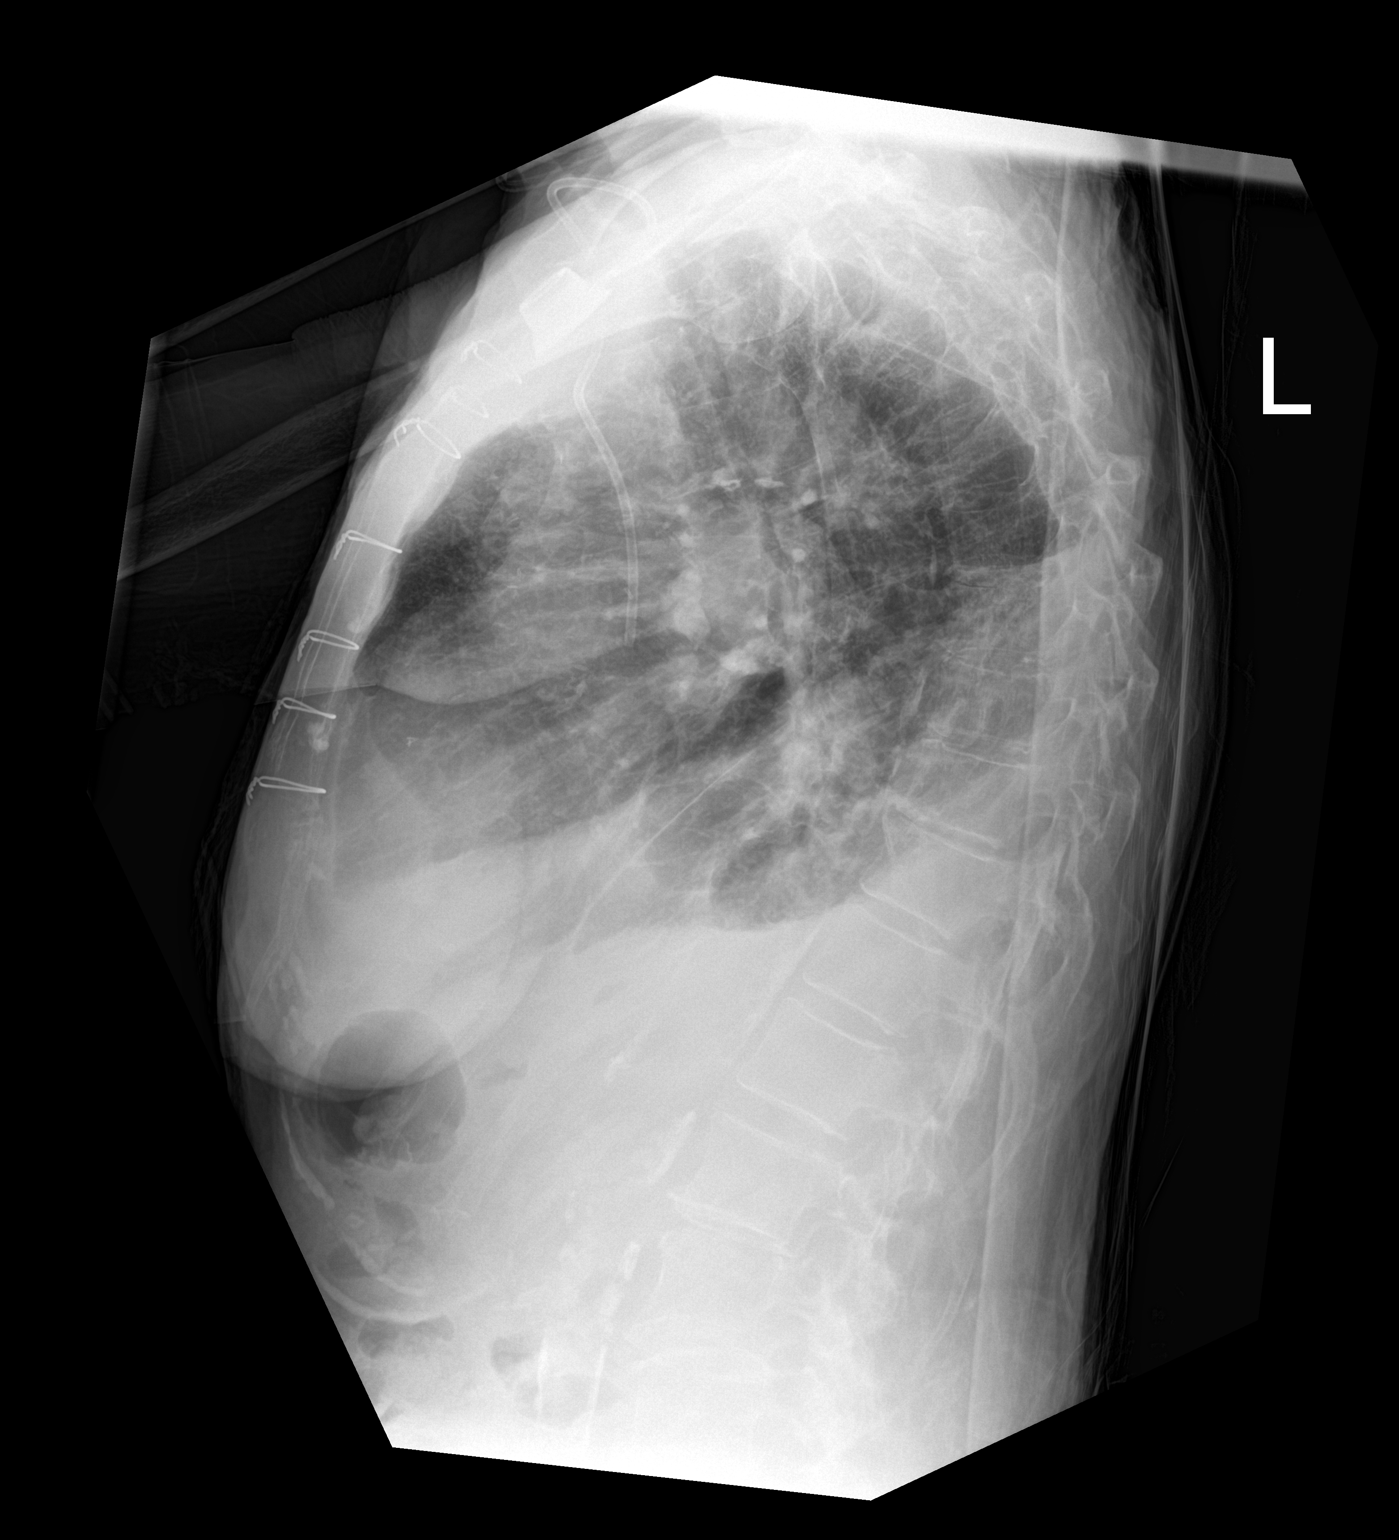

[chest ap]
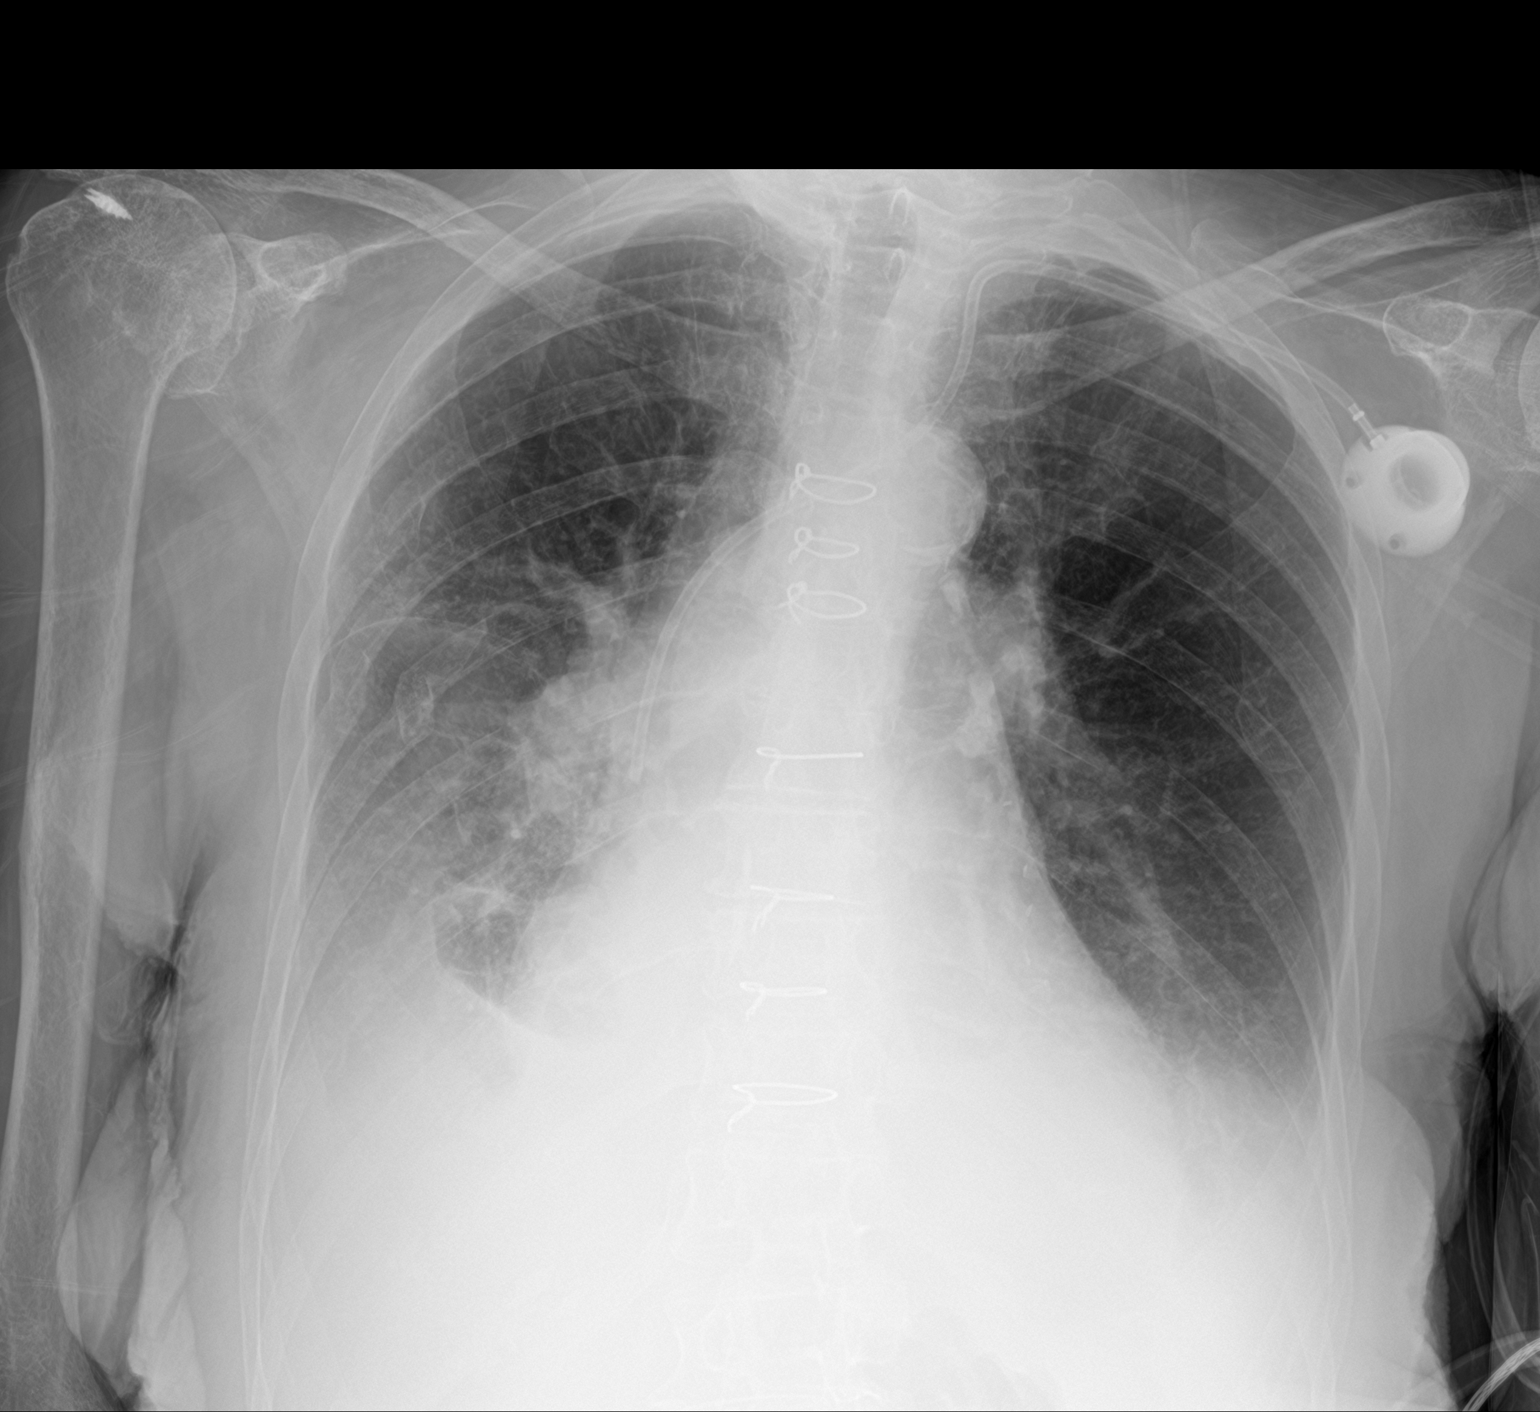

[2 of 2 positions shown; findings below may reference images not displayed]

FINDINGS: Left chest port with tip in the mid SVC. Post median sternotomy.
Bibasilar airspace opacities and pleural effusions, right greater
than left. There is vascular congestion. No pneumothorax. Remote
right rib fractures. Chronic hyperinflation. No acute osseous
abnormalities are seen.
IMPRESSION: 1. Bibasilar airspace opacities and pleural effusions, right greater
than left. Pneumonia versus passive atelectasis.
2. Vascular congestion.
